# Patient Record
Sex: Female | Born: 1945 | Race: White | Hispanic: No | Marital: Married | Smoking: Never smoker
Health system: Southern US, Community
[De-identification: ages and names within clinical notes are randomized; demographics above are authoritative.]

## PROBLEM LIST (undated history)

## (undated) DIAGNOSIS — E860 Dehydration: Secondary | ICD-10-CM

## (undated) DIAGNOSIS — I4721 Torsades de pointes: Secondary | ICD-10-CM

## (undated) DIAGNOSIS — I472 Ventricular tachycardia: Secondary | ICD-10-CM

## (undated) DIAGNOSIS — I4729 Other ventricular tachycardia: Secondary | ICD-10-CM

## (undated) DIAGNOSIS — E876 Hypokalemia: Secondary | ICD-10-CM

## (undated) DIAGNOSIS — F0390 Unspecified dementia without behavioral disturbance: Secondary | ICD-10-CM

## (undated) DIAGNOSIS — E119 Type 2 diabetes mellitus without complications: Secondary | ICD-10-CM

## (undated) DIAGNOSIS — E785 Hyperlipidemia, unspecified: Secondary | ICD-10-CM

## (undated) HISTORY — DX: Dehydration: E86.0

---

## 2019-09-06 DIAGNOSIS — C44311 Basal cell carcinoma of skin of nose: Secondary | ICD-10-CM | POA: Diagnosis not present

## 2019-09-06 DIAGNOSIS — L578 Other skin changes due to chronic exposure to nonionizing radiation: Secondary | ICD-10-CM | POA: Diagnosis not present

## 2019-09-06 DIAGNOSIS — L821 Other seborrheic keratosis: Secondary | ICD-10-CM | POA: Diagnosis not present

## 2019-09-06 DIAGNOSIS — D485 Neoplasm of uncertain behavior of skin: Secondary | ICD-10-CM | POA: Diagnosis not present

## 2019-09-06 DIAGNOSIS — L570 Actinic keratosis: Secondary | ICD-10-CM | POA: Diagnosis not present

## 2019-09-06 DIAGNOSIS — D2372 Other benign neoplasm of skin of left lower limb, including hip: Secondary | ICD-10-CM | POA: Diagnosis not present

## 2019-09-06 DIAGNOSIS — D0439 Carcinoma in situ of skin of other parts of face: Secondary | ICD-10-CM | POA: Diagnosis not present

## 2019-09-25 ENCOUNTER — Ambulatory Visit: Payer: Medicare Other | Attending: Internal Medicine

## 2019-09-25 DIAGNOSIS — Z20822 Contact with and (suspected) exposure to covid-19: Secondary | ICD-10-CM | POA: Diagnosis not present

## 2019-09-26 LAB — NOVEL CORONAVIRUS, NAA: SARS-CoV-2, NAA: DETECTED — AB

## 2019-09-27 ENCOUNTER — Other Ambulatory Visit: Payer: Self-pay | Admitting: Nurse Practitioner

## 2019-09-27 DIAGNOSIS — U071 COVID-19: Secondary | ICD-10-CM

## 2019-09-27 NOTE — Progress Notes (Signed)
  I connected by phone with Stephanie Acevedo on 09/27/2019 at 1:08 PM to discuss the potential use of an new treatment for mild to moderate COVID-19 viral infection in non-hospitalized patients.  This patient is a 74 y.o. female that meets the FDA criteria for Emergency Use Authorization of bamlanivimab or casirivimab\imdevimab.  Has a (+) direct SARS-CoV-2 viral test result  Has mild or moderate COVID-19   Is ? 74 years of age and weighs ? 40 kg  Is NOT hospitalized due to COVID-19  Is NOT requiring oxygen therapy or requiring an increase in baseline oxygen flow rate due to COVID-19  Is within 10 days of symptom onset  Has at least one of the high risk factor(s) for progression to severe COVID-19 and/or hospitalization as defined in EUA.  Specific high risk criteria : >/= 73 yo   I have spoken and communicated the following to the patient or parent/caregiver:  1. FDA has authorized the emergency use of bamlanivimab and casirivimab\imdevimab for the treatment of mild to moderate COVID-19 in adults and pediatric patients with positive results of direct SARS-CoV-2 viral testing who are 60 years of age and older weighing at least 40 kg, and who are at high risk for progressing to severe COVID-19 and/or hospitalization.  2. The significant known and potential risks and benefits of bamlanivimab and casirivimab\imdevimab, and the extent to which such potential risks and benefits are unknown.  3. Information on available alternative treatments and the risks and benefits of those alternatives, including clinical trials.  4. Patients treated with bamlanivimab and casirivimab\imdevimab should continue to self-isolate and use infection control measures (e.g., wear mask, isolate, social distance, avoid sharing personal items, clean and disinfect "high touch" surfaces, and frequent handwashing) according to CDC guidelines.   5. The patient or parent/caregiver has the option to accept or refuse  bamlanivimab or casirivimab\imdevimab .  After reviewing this information with the patient, The patient agreed to proceed with receiving the bamlanimivab infusion and will be provided a copy of the Fact sheet prior to receiving the infusion.Stephanie Acevedo 09/27/2019 1:08 PM  Note: Patient has dementia, but daughter states that symptoms started on 09/22/19

## 2019-09-30 ENCOUNTER — Ambulatory Visit (HOSPITAL_COMMUNITY)
Admission: RE | Admit: 2019-09-30 | Discharge: 2019-09-30 | Disposition: A | Discharge status: Home / Self Care | Payer: Medicare Other | Source: Ambulatory Visit | Attending: Pulmonary Disease | Admitting: Pulmonary Disease

## 2019-09-30 DIAGNOSIS — U071 COVID-19: Secondary | ICD-10-CM | POA: Diagnosis present

## 2019-09-30 DIAGNOSIS — Z23 Encounter for immunization: Secondary | ICD-10-CM | POA: Diagnosis not present

## 2019-09-30 MED ORDER — DIPHENHYDRAMINE HCL 50 MG/ML IJ SOLN: 50.0000 mg | Freq: Once | INTRAMUSCULAR | Status: DC | PRN

## 2019-09-30 MED ORDER — EPINEPHRINE 0.3 MG/0.3ML IJ SOAJ: 0.3000 mg | Freq: Once | INTRAMUSCULAR | Status: DC | PRN

## 2019-09-30 MED ORDER — ALBUTEROL SULFATE HFA 108 (90 BASE) MCG/ACT IN AERS: 2.0000 | INHALATION_SPRAY | Freq: Once | RESPIRATORY_TRACT | Status: DC | PRN

## 2019-09-30 MED ORDER — SODIUM CHLORIDE 0.9 % IV SOLN
700.0000 mg | Freq: Once | INTRAVENOUS | Status: AC
  Administered 2019-09-30: 700 mg via INTRAVENOUS
  Filled 2019-09-30: qty 20

## 2019-09-30 MED ORDER — SODIUM CHLORIDE 0.9 % IV SOLN
INTRAVENOUS | Status: DC | PRN
  Administered 2019-09-30: 250 mL via INTRAVENOUS

## 2019-09-30 MED ORDER — FAMOTIDINE IN NACL 20-0.9 MG/50ML-% IV SOLN: 20.0000 mg | Freq: Once | INTRAVENOUS | Status: DC | PRN

## 2019-09-30 MED ORDER — METHYLPREDNISOLONE SODIUM SUCC 125 MG IJ SOLR: 125.0000 mg | Freq: Once | INTRAMUSCULAR | Status: DC | PRN

## 2019-09-30 NOTE — Progress Notes (Signed)
  Diagnosis: COVID-19  Physician:Dr Wright  Procedure: [facility] Med: bamlanivimab infusion - Provided patient with bamlanimivab fact sheet for patients, parents and caregivers prior to infusion.  Complications: No immediate complications noted.  Discharge: Discharged home   Stephanie Acevedo W 09/30/2019  

## 2019-09-30 NOTE — Discharge Instructions (Signed)

## 2019-11-15 DIAGNOSIS — C44319 Basal cell carcinoma of skin of other parts of face: Secondary | ICD-10-CM | POA: Diagnosis not present

## 2019-11-27 ENCOUNTER — Encounter: Payer: Self-pay | Admitting: Internal Medicine

## 2019-11-27 ENCOUNTER — Other Ambulatory Visit: Payer: Self-pay

## 2019-11-27 ENCOUNTER — Ambulatory Visit (INDEPENDENT_AMBULATORY_CARE_PROVIDER_SITE_OTHER): Payer: Medicare Other | Admitting: Internal Medicine

## 2019-11-27 VITALS — BP 128/78 | HR 78 | Temp 97.3°F | Ht 65.5 in | Wt 117.6 lb

## 2019-11-27 DIAGNOSIS — R4689 Other symptoms and signs involving appearance and behavior: Secondary | ICD-10-CM | POA: Diagnosis not present

## 2019-11-27 DIAGNOSIS — R413 Other amnesia: Secondary | ICD-10-CM | POA: Insufficient documentation

## 2019-11-27 DIAGNOSIS — M25662 Stiffness of left knee, not elsewhere classified: Secondary | ICD-10-CM

## 2019-11-27 DIAGNOSIS — R4189 Other symptoms and signs involving cognitive functions and awareness: Secondary | ICD-10-CM | POA: Insufficient documentation

## 2019-11-27 NOTE — Progress Notes (Signed)
Location:  [facility] clinic  Provider: Dr. Bufford Spikes  Goals of Care:  Advanced Directives 11/27/2019  Does Patient Have a Medical Advance Directive? No  Would patient like information on creating a medical advance directive? Yes (ED - Information included in AVS)     Chief Complaint  Patient presents with  . Establish Care    new patient to estabalish care     HPI: Patient is a 74 y.o. female seen today to establish care as a new patient.   Husband present for visit  She had only seen urgent care a handful of times in her life. Did not have a PCP in the past. Daughter found [facility] on the internet.   Memory- In the past 6 months, her short-term memory has been an issue. Husband started her on prevagen supplement for memory a few months ago. Husband claims her memory had declined further after having covid-19 in January 2021. At that time she had delusions and could not identify her husband. Since recovering, she can identify her husband, place and situation, she still has difficulty with time. In addition, she has had hallucinations believing someone has stolen her pepsi. She can also become agitated easy. MRI done of neck, brain and spine in 2011. CD copy brought by husband.   Right ankle pain- Began in 2009. Had seen a specialist int he past and had MRI done in [place]. Pain had went away. In the past couple months pain has returned. No recent injuries or falls. Does not use a assistive device to ambulate. Husband states her leg appears stiff and she does not bend her knee when ambulating. Does not take any medicine for the right ankle pain.  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   Had a mild case of covid in January. Interested in getting covid vaccine in April 2021.   Eats three meals a day. Drinks pepsi and water. Husband prepares meals.   Walks every day for about 30 minutes, half a mile.   Last eye exam was about 3 years ago.   Can not  remember last dental visit. Denies dental pain.   Does not drive. Husband does all the driving  Lives in a one story house. No rugs or cords within hallways.   Sleeps about 8-9 hours a night. Reads before going to bed.       History reviewed. No pertinent past medical history.  History reviewed. No pertinent surgical history.  No Known Allergies  Outpatient Encounter Medications as of 11/27/2019  Medication Sig  . Apoaequorin (PREVAGEN EXTRA STRENGTH PO) Take by mouth daily.  . Multiple Vitamin (MULTIVITAMIN) tablet Take 1 tablet by mouth 2 (two) times daily.   No facility-administered encounter medications on file as of 11/27/2019.    Review of Systems:  Review of Systems  HENT: Negative for dental problem, hearing loss and trouble swallowing.   Eyes: Negative for photophobia and visual disturbance.       Eyeglasses  Cardiovascular: Negative for chest pain and leg swelling.  Gastrointestinal: Negative for abdominal pain, constipation, diarrhea and nausea.  Genitourinary: Negative for dysuria, frequency, hematuria and vaginal bleeding.  Musculoskeletal: Negative.        Right ankle pain  Skin:       Facial skin pain  Neurological: Negative for dizziness, tremors, weakness and headaches.  Psychiatric/Behavioral: Positive for agitation, dysphoric mood and hallucinations. Negative for sleep disturbance. The patient is nervous/anxious.  Health Maintenance  Topic Date Due  . Hepatitis C Screening  Never done  . TETANUS/TDAP  Never done  . MAMMOGRAM  Never done  . COLONOSCOPY  Never done  . DEXA SCAN  Never done  . PNA vac Low Risk Adult (1 of 2 - PCV13) Never done  . INFLUENZA VACCINE  Never done    Physical Exam: Vitals:   11/27/19 1346  BP: 128/78  Pulse: 78  Temp: (!) 97.3 F (36.3 C)  TempSrc: Temporal  SpO2: 98%  Weight: 117 lb 9.6 oz (53.3 kg)  Height: 5' 5.5" (1.664 m)   Body mass index is 19.27 kg/m. Physical Exam Vitals reviewed.   Constitutional:      Appearance: Normal appearance. She is normal weight.  HENT:     Head: Normocephalic.     Right Ear: There is impacted cerumen.     Left Ear: There is no impacted cerumen.     Mouth/Throat:     Mouth: Mucous membranes are moist.     Pharynx: No posterior oropharyngeal erythema.  Eyes:     Extraocular Movements: Extraocular movements intact.     Pupils: Pupils are equal, round, and reactive to light.  Neck:     Thyroid: No thyroid mass, thyromegaly or thyroid tenderness.  Cardiovascular:     Rate and Rhythm: Normal rate and regular rhythm.     Pulses: Normal pulses.     Heart sounds: Normal heart sounds. No murmur.  Pulmonary:     Effort: Pulmonary effort is normal. No respiratory distress.     Breath sounds: Normal breath sounds. No wheezing.  Abdominal:     General: Abdomen is flat. Bowel sounds are normal.     Palpations: Abdomen is soft.  Musculoskeletal:     Right lower leg: No edema.     Left lower leg: No edema.  Lymphadenopathy:     Cervical: No cervical adenopathy.  Skin:    General: Skin is warm and dry.     Capillary Refill: Capillary refill takes less than 2 seconds.  Neurological:     Mental Status: She is alert.     Sensory: Sensation is intact.     Motor: Motor function is intact. No tremor.     Coordination: Coordination is intact.     Gait: Gait abnormal.     Deep Tendon Reflexes: Reflexes are normal and symmetric.  Psychiatric:        Attention and Perception: Attention normal.        Mood and Affect: Affect is flat.        Speech: Speech normal.        Behavior: Behavior is slowed.        Thought Content: Thought content is paranoid.        Cognition and Memory: Memory is impaired.     Labs reviewed: Basic Metabolic Panel: No results for input(s): NA, K, CL, CO2, GLUCOSE, BUN, CREATININE, CALCIUM, MG, PHOS, TSH in the last 8760 hours. Liver Function Tests: No results for input(s): AST, ALT, ALKPHOS, BILITOT, PROT, ALBUMIN in  the last 8760 hours. No results for input(s): LIPASE, AMYLASE in the last 8760 hours. No results for input(s): AMMONIA in the last 8760 hours. CBC: No results for input(s): WBC, NEUTROABS, HGB, HCT, MCV, PLT in the last 8760 hours. Lipid Panel: No results for input(s): CHOL, HDL, LDLCALC, TRIG, CHOLHDL, LDLDIRECT in the last 8760 hours. No results found for: HGBA1C  Procedures since last visit: No results found.  Assessment/Plan 1. Cognitive and behavioral changes - MMSE 11/30, did not fill out clock  - she has a family history of dementia on her mothers side - appears to have delusions and/or hallucinations within the past months, most associated with someone taking her pepsi  2. Memory loss - same as above - will r/o electrolyte deficiency as underlying cause - cbc with differential/platelets- today - complete metabolic panel with GFR- today - hepatic function panel- today - syphillus/ RPR- today - TSH- today - Vitamin B12- today - referral CT head wo contrast  3. Joint stiffness of left lower leg - may be associated with dementia or arthritis - her gait is stiff but not unbalanced - may use tylenol 325-650 mg as needed for pain - may use heat to ankle for pain or stiffness - continue light walking daily    Labs/tests ordered:  Cbc with differential/platelets, complete metabolic panel with GFR, TSH, hepatic function panel, syphilis panel, vitamin b12- today, CT head wo contrast Next appt:  2 weeks

## 2019-11-28 LAB — CBC WITH DIFFERENTIAL/PLATELET
Absolute Monocytes: 639 cells/uL (ref 200–950)
Basophils Absolute: 113 cells/uL (ref 0–200)
Basophils Relative: 1.2 %
Eosinophils Absolute: 122 cells/uL (ref 15–500)
Eosinophils Relative: 1.3 %
HCT: 38.3 % (ref 35.0–45.0)
Hemoglobin: 13.1 g/dL (ref 11.7–15.5)
Lymphs Abs: 2124 cells/uL (ref 850–3900)
MCH: 29.8 pg (ref 27.0–33.0)
MCHC: 34.2 g/dL (ref 32.0–36.0)
MCV: 87 fL (ref 80.0–100.0)
MPV: 10.4 fL (ref 7.5–12.5)
Monocytes Relative: 6.8 %
Neutro Abs: 6401 cells/uL (ref 1500–7800)
Neutrophils Relative %: 68.1 %
Platelets: 332 10*3/uL (ref 140–400)
RBC: 4.4 10*6/uL (ref 3.80–5.10)
RDW: 13.1 % (ref 11.0–15.0)
Total Lymphocyte: 22.6 %
WBC: 9.4 10*3/uL (ref 3.8–10.8)

## 2019-11-28 LAB — BASIC METABOLIC PANEL
BUN: 17 mg/dL (ref 7–25)
CO2: 26 mmol/L (ref 20–32)
Calcium: 9.7 mg/dL (ref 8.6–10.4)
Chloride: 104 mmol/L (ref 98–110)
Creat: 0.79 mg/dL (ref 0.60–0.93)
Glucose, Bld: 140 mg/dL — ABNORMAL HIGH (ref 65–99)
Potassium: 3.4 mmol/L — ABNORMAL LOW (ref 3.5–5.3)
Sodium: 142 mmol/L (ref 135–146)

## 2019-11-28 LAB — HEPATIC FUNCTION PANEL
AG Ratio: 1.4 (calc) (ref 1.0–2.5)
ALT: 11 U/L (ref 6–29)
AST: 17 U/L (ref 10–35)
Albumin: 4.3 g/dL (ref 3.6–5.1)
Alkaline phosphatase (APISO): 74 U/L (ref 37–153)
Bilirubin, Direct: 0.1 mg/dL (ref 0.0–0.2)
Globulin: 3 g/dL (calc) (ref 1.9–3.7)
Indirect Bilirubin: 0.2 mg/dL (calc) (ref 0.2–1.2)
Total Bilirubin: 0.3 mg/dL (ref 0.2–1.2)
Total Protein: 7.3 g/dL (ref 6.1–8.1)

## 2019-11-28 LAB — RPR: RPR Ser Ql: NONREACTIVE

## 2019-11-28 LAB — TSH: TSH: 1.17 mIU/L (ref 0.40–4.50)

## 2019-11-28 LAB — VITAMIN B12: Vitamin B-12: 619 pg/mL (ref 200–1100)

## 2019-11-28 NOTE — Progress Notes (Signed)
Please notify her husband.  Overall labs were good.   Blood counts are normal. Potassium was slightly low.  This can be improved with bananas, oranges, sweet potatoes, tomatoes in the diet (as examples). Sugar was higher than normal at 140.  I could not check the sugar average without having that information.  We can do the average at a future visit to make sure she's not developing diabetes (these were not fasting labs though). Liver panel was normal. Thyroid was normal. B12 vitamin was normal. RPR (syphilis test done because [place] and it can cause dementia) was negative. Await CT of brain to be done.

## 2019-12-11 ENCOUNTER — Ambulatory Visit (INDEPENDENT_AMBULATORY_CARE_PROVIDER_SITE_OTHER): Payer: Medicare Other | Admitting: Internal Medicine

## 2019-12-11 ENCOUNTER — Other Ambulatory Visit: Payer: Self-pay

## 2019-12-11 ENCOUNTER — Encounter: Payer: Self-pay | Admitting: Internal Medicine

## 2019-12-11 VITALS — BP 120/77 | HR 69 | Temp 97.5°F | Ht 65.0 in | Wt 114.5 lb

## 2019-12-11 DIAGNOSIS — E876 Hypokalemia: Secondary | ICD-10-CM | POA: Diagnosis not present

## 2019-12-11 DIAGNOSIS — R739 Hyperglycemia, unspecified: Secondary | ICD-10-CM

## 2019-12-11 DIAGNOSIS — M25661 Stiffness of right knee, not elsewhere classified: Secondary | ICD-10-CM | POA: Diagnosis not present

## 2019-12-11 DIAGNOSIS — F0390 Unspecified dementia without behavioral disturbance: Secondary | ICD-10-CM

## 2019-12-11 NOTE — Progress Notes (Signed)
Location:  [facility] clinic Provider:  Feliciana Narayan L. Renato Acevedo, D.O., C.M.D.  Goals of Care:  Advanced Directives 12/11/2019  Does Patient Have a Medical Advance Directive? No  Would patient like information on creating a medical advance directive? No - Patient declined   Chief Complaint  Patient presents with  . Medical Management of Chronic Issues    2 week follow up with lab results .    HPI: Patient is a 74 y.o. female seen today for medical management of chronic diseases.  She is here with her husband in f/u after her labs but has not yet had her CT brain I ordered (it is scheduled).    She's been eating two oranges a day for her potassium and some tomatoes.    Her husband reports she had an episode of bad confusion where she didn't know who he was and wanted to go out looking for him.  It was like she was reliving something from back when she had covid.  That had happened with her once when she'd had covid.  She was wanting to go to the fire station to see if they could find him.  Now she has no recollection.  It lasted a good hour or more.  Then she calmed down.  Not nervous or worried otherwise.  They had together attended some [employer] educational programs when her mother had dementia.   She has had periods of shortness of breath.  Usually when out walking.  No coughing or bringing up sputum.  No chest pain reported with it.  Never smoked.  POX at home was 92% which was low for her but here it is 98%.  Only finding on labs was hyperglycemia so hba1c will be done today.  History reviewed. No pertinent past medical history.  History reviewed. No pertinent surgical history.  No Known Allergies  Outpatient Encounter Medications as of 12/11/2019  Medication Sig  . Apoaequorin (PREVAGEN EXTRA STRENGTH PO) Take by mouth daily.  . Multiple Vitamin (MULTIVITAMIN) tablet Take 1 tablet by mouth 2 (two) times daily.   No facility-administered encounter medications on file as of  12/11/2019.    Review of Systems:  Review of Systems  Constitutional: Positive for weight loss. Negative for chills, fever and malaise/fatigue.  HENT: Negative for congestion and hearing loss.   Eyes: Negative for blurred vision.       Glasses  Respiratory: Negative for cough and shortness of breath.   Cardiovascular: Negative for chest pain, palpitations and leg swelling.  Gastrointestinal: Negative for abdominal pain, blood in stool, constipation, diarrhea and melena.  Genitourinary: Negative for dysuria.  Musculoskeletal: Positive for joint pain. Negative for falls.       Right knee  Skin: Negative for itching and rash.    Health Maintenance  Topic Date Due  . Hepatitis C Screening  Never done  . TETANUS/TDAP  Never done  . MAMMOGRAM  Never done  . COLONOSCOPY  Never done  . DEXA SCAN  Never done  . PNA vac Low Risk Adult (1 of 2 - PCV13) Never done  . INFLUENZA VACCINE  Never done    Physical Exam: Vitals:   12/11/19 1045  BP: 120/77  Pulse: 69  Temp: (!) 97.5 F (36.4 C)  TempSrc: Temporal  SpO2: 98%  Weight: 114 lb 8 oz (51.9 kg)  Height: 5\' 5"  (1.651 m)   Body mass index is 19.05 kg/m. Physical Exam Constitutional:      General: She is not in  acute distress.    Appearance: Normal appearance. She is not toxic-appearing.     Comments: A bit disheveled appearing  HENT:     Head: Normocephalic and atraumatic.  Cardiovascular:     Rate and Rhythm: Normal rate and regular rhythm.     Pulses: Normal pulses.     Heart sounds: Normal heart sounds.  Pulmonary:     Effort: Pulmonary effort is normal.     Breath sounds: Normal breath sounds. No wheezing, rhonchi or rales.  Abdominal:     General: Bowel sounds are normal. There is no distension.     Palpations: Abdomen is soft. There is no mass.     Tenderness: There is no abdominal tenderness. There is no guarding or rebound.  Musculoskeletal:     Right lower leg: No edema.     Left lower leg: No edema.    Skin:    General: Skin is warm and dry.  Neurological:     General: No focal deficit present.     Mental Status: She is alert.     Cranial Nerves: No cranial nerve deficit.     Gait: Gait abnormal.     Comments: Right knee stiffness and some tenderness  Psychiatric:        Mood and Affect: Mood normal.     Comments: Quiet, only answers questions, does not initiate conversation     Labs reviewed: Basic Metabolic Panel: Recent Labs    11/27/19 1540  NA 142  K 3.4*  CL 104  CO2 26  GLUCOSE 140*  BUN 17  CREATININE 0.79  CALCIUM 9.7  TSH 1.17   Liver Function Tests: Recent Labs    11/27/19 1540  AST 17  ALT 11  BILITOT 0.3  PROT 7.3   No results for input(s): LIPASE, AMYLASE in the last 8760 hours. No results for input(s): AMMONIA in the last 8760 hours. CBC: Recent Labs    11/27/19 1540  WBC 9.4  NEUTROABS 6,401  HGB 13.1  HCT 38.3  MCV 87.0  PLT 332   Assessment/Plan 1. Dementia without behavioral disturbance, unspecified dementia type ([place]) - labs thus far only remarkable for elevated glucose--check hba1c today and treat accordingly -discussed aricept with her husband but opted not to start due to minimal benefit but primarily due to concern that she will lose more weight and she's already underweight -[employer] info provided to her husband  2. Joint stiffness of right lower leg - to get knee xray when she goes for her CT head if possible -previous MRI she had of her brain was done to r/o a stroke when the stiffness of the right leg began and was reportedly negative (on CD so her husband is going to bring a cd drive so I can see it) - DG Knee Complete 4 Views Right; Future  3. Hyperglycemia - on initial lab but was not fasting then so will check hba1c - Hemoglobin A1c  4. Hypokalemia -will f/u today after she increased her potassium-containing foods - Basic metabolic panel  Labs/tests ordered:   Lab Orders     Hemoglobin A1c     Basic  metabolic panel  Next appt:  12/25/2019 to review CT scan, start namenda possibly?  Stephanie Acevedo L. Jermiyah Ricotta, D.O. [facility] [address], [place] [address] Cell Phone (Mon-Fri 8am-5pm):  [phone] On Call:  [phone] & follow prompts after 5pm & weekends Office Phone:  [phone] Office Fax:  [phone]

## 2019-12-12 LAB — BASIC METABOLIC PANEL
BUN: 13 mg/dL (ref 7–25)
CO2: 30 mmol/L (ref 20–32)
Calcium: 9.6 mg/dL (ref 8.6–10.4)
Chloride: 106 mmol/L (ref 98–110)
Creat: 0.84 mg/dL (ref 0.60–0.93)
Glucose, Bld: 163 mg/dL — ABNORMAL HIGH (ref 65–139)
Potassium: 3.7 mmol/L (ref 3.5–5.3)
Sodium: 144 mmol/L (ref 135–146)

## 2019-12-12 LAB — HEMOGLOBIN A1C
Hgb A1c MFr Bld: 7.5 % of total Hgb — ABNORMAL HIGH (ref <5.7)
Mean Plasma Glucose: 169 (calc)
eAG (mmol/L): 9.3 (calc)

## 2019-12-12 NOTE — Progress Notes (Signed)
Potassium has improved to normal with increase in potassium intake in diet.   Unfortunately, she has diabetes.  Her sugar average is up to 7.5 which means if we pricked her finger continuously to check her sugar over three months and took an average of all of those numbers, it would be 169 which is well above the normal range of 65-139.  I recommend that she and her husband meet with a nutritionist to help modify her diet.  I'm hesitant to start her on diabetes medications b/c she is already so thin and almost all of them cause weight loss.  If they are agreeable, I will place the referral for nutrition.

## 2019-12-14 ENCOUNTER — Other Ambulatory Visit: Payer: Self-pay

## 2019-12-14 ENCOUNTER — Ambulatory Visit
Admission: RE | Admit: 2019-12-14 | Discharge: 2019-12-14 | Disposition: A | Discharge status: Home / Self Care | Payer: Medicare Other | Source: Ambulatory Visit | Attending: Internal Medicine | Admitting: Internal Medicine

## 2019-12-14 DIAGNOSIS — R410 Disorientation, unspecified: Secondary | ICD-10-CM | POA: Diagnosis not present

## 2019-12-14 DIAGNOSIS — R413 Other amnesia: Secondary | ICD-10-CM

## 2019-12-14 DIAGNOSIS — R4689 Other symptoms and signs involving appearance and behavior: Secondary | ICD-10-CM

## 2019-12-14 DIAGNOSIS — E119 Type 2 diabetes mellitus without complications: Secondary | ICD-10-CM | POA: Insufficient documentation

## 2019-12-14 DIAGNOSIS — R4189 Other symptoms and signs involving cognitive functions and awareness: Secondary | ICD-10-CM

## 2019-12-14 NOTE — Progress Notes (Signed)
CT brain does not show any acute strokes.  She does have shrinkage of the brain overall and areas of poor circulation in the areas of the brain that help with executive functioning (performing self-care day to day).    It was also noted that her nasal septum is deviated and she has some congestion in the sinus above her nose.

## 2019-12-25 ENCOUNTER — Encounter: Payer: Self-pay | Admitting: Internal Medicine

## 2019-12-25 ENCOUNTER — Other Ambulatory Visit: Payer: Self-pay

## 2019-12-25 ENCOUNTER — Ambulatory Visit (INDEPENDENT_AMBULATORY_CARE_PROVIDER_SITE_OTHER): Payer: Medicare Other | Admitting: Internal Medicine

## 2019-12-25 VITALS — BP 138/84 | HR 64 | Temp 97.3°F | Resp 16 | Ht 65.0 in | Wt 112.8 lb

## 2019-12-25 DIAGNOSIS — F0390 Unspecified dementia without behavioral disturbance: Secondary | ICD-10-CM

## 2019-12-25 DIAGNOSIS — E119 Type 2 diabetes mellitus without complications: Secondary | ICD-10-CM

## 2019-12-25 DIAGNOSIS — R634 Abnormal weight loss: Secondary | ICD-10-CM

## 2019-12-25 MED ORDER — DONEPEZIL HCL 5 MG PO TABS: 5.0000 mg | ORAL_TABLET | Freq: Every day | ORAL | 0 refills | Status: DC

## 2019-12-25 NOTE — Progress Notes (Signed)
Location:  [facility] clinic Provider:  Salinda Snedeker L. Renato Gails, D.O., C.M.D.  Code Status: NEED TO DISCUSS THIS AT NEXT APPT  Goals of Care:  Advanced Directives 12/25/2019  Does Patient Have a Medical Advance Directive? No  Would patient like information on creating a medical advance directive? No - Patient declined     Chief Complaint  Patient presents with  . Follow-up    Follow Up for CT Scan    HPI: Patient is a 74 y.o. female seen today for medical management of chronic diseases.    Reviewed prior MRI brain from many years ago when her leg stiffness began which already did show a few ischemic changes and slight atrophy--these are now more prominent on CT.    BP 120/80. Excellent at home.  Discussed hba1c of 7.5 as new diagnosis of diabetes.  I have referred them to nutrition due to concern that if I put her on a med she will lose more weight.   Main diet is pepsis and chocolate.  She is eating oranges now, cantelope.   She does no like veggies.  Does like broccoli and salads.  Likes tomatoes, carrots.   She is drinking one ensure per day--only the 4 sugars.  Discussed glucerna.  Her husband is trying to get her to eat more regular food. She does walk quite a bit.  25 miles a week.  She lost 1.5 more lbs.  She is fairly active.    Discussed getting covid vaccines now that they are done with the 90 day period.  They will get them different days in case he does not tolerate it well so he can still take care of her.    History reviewed. No pertinent past medical history.  History reviewed. No pertinent surgical history.  No Known Allergies  Outpatient Encounter Medications as of 12/25/2019  Medication Sig  . Apoaequorin (PREVAGEN EXTRA STRENGTH PO) Take by mouth daily.  . Multiple Vitamin (MULTIVITAMIN) tablet Take 1 tablet by mouth 2 (two) times daily.   No facility-administered encounter medications on file as of 12/25/2019.    Review of Systems:  Review of Systems    Constitutional: Positive for weight loss. Negative for chills, fever and malaise/fatigue.  Eyes: Negative for blurred vision.       Glasses  Cardiovascular: Negative for chest pain, palpitations and leg swelling.  Genitourinary: Negative for dysuria.    Health Maintenance  Topic Date Due  . Hepatitis C Screening  Never done  . FOOT EXAM  Never done  . OPHTHALMOLOGY EXAM  Never done  . URINE MICROALBUMIN  Never done  . TETANUS/TDAP  Never done  . MAMMOGRAM  Never done  . COLONOSCOPY  Never done  . DEXA SCAN  Never done  . PNA vac Low Risk Adult (1 of 2 - PCV13) Never done  . INFLUENZA VACCINE  04/14/2020  . HEMOGLOBIN A1C  06/12/2020    Physical Exam: Vitals:   12/25/19 1010  BP: 138/84  Pulse: 64  Resp: 16  Temp: (!) 97.3 F (36.3 C)  SpO2: 97%  Weight: 112 lb 12.8 oz (51.2 kg)  Height: 5\' 5"  (1.651 m)   Body mass index is 18.77 kg/m. Physical Exam  Labs reviewed: Basic Metabolic Panel: Recent Labs    11/27/19 1540 12/11/19 1144  NA 142 144  K 3.4* 3.7  CL 104 106  CO2 26 30  GLUCOSE 140* 163*  BUN 17 13  CREATININE 0.79 0.84  CALCIUM 9.7 9.6  TSH  1.17  --    Liver Function Tests: Recent Labs    11/27/19 1540  AST 17  ALT 11  BILITOT 0.3  PROT 7.3   No results for input(s): LIPASE, AMYLASE in the last 8760 hours. No results for input(s): AMMONIA in the last 8760 hours. CBC: Recent Labs    11/27/19 1540  WBC 9.4  NEUTROABS 6,401  HGB 13.1  HCT 38.3  MCV 87.0  PLT 332   Lipid Panel: No results for input(s): CHOL, HDL, LDLCALC, TRIG, CHOLHDL, LDLDIRECT in the last 8760 hours. Lab Results  Component Value Date   HGBA1C 7.5 (H) 12/11/2019    Procedures since last visit: CT Head Wo Contrast  Result Date: 12/14/2019 CLINICAL DATA:  Memory loss with gait disturbance. Episodes of confusion EXAM: CT HEAD WITHOUT CONTRAST TECHNIQUE: Contiguous axial images were obtained from the base of the skull through the vertex without intravenous  contrast. COMPARISON:  None. FINDINGS: Brain: There is mild to moderate diffuse atrophy. There is no intracranial mass, hemorrhage, extra-axial fluid collection, or midline shift. There is mild small vessel disease in the centra semiovale bilaterally. Brain parenchyma elsewhere unremarkable. No acute infarct is demonstrable on this study. Vascular: No hyperdense vessels. No appreciable vascular calcification. Skull: The bony calvarium appears intact. Sinuses/Orbits: Mucosal thickening is noted involving several ethmoid air cells. There is rightward deviation of the nasal septum. Other visualized paranasal sinuses are clear. Orbits appear symmetric bilaterally. Other: Mastoid air cells are clear. IMPRESSION: Atrophy with mild periventricular small vessel disease. No acute infarct. No mass or hemorrhage. Mucosal thickening in several ethmoid air cells. Deviated nasal septum. Electronically Signed   By: Lowella Grip III M.D.   On: 12/14/2019 09:33    Assessment/Plan 1. Dementia without behavioral disturbance, unspecified dementia type ([place]) - I also wanted to try her on namenda straight away b/c of her weight loss risk; however, her insurance would not cover it without trying alternatives first so we will attempt aricept--side effects include potentially diarrhea, nightmares, weight loss, bradycardia - donepezil (ARICEPT) 5 MG tablet; Take 1 tablet (5 mg total) by mouth at bedtime.  Dispense: 30 tablet; Refill: 0  2. Weight loss -down a couple more lbs since last visit -needs more protein in diet and nutritious foods, lives off sweet things  3. Controlled type 2 diabetes mellitus without complication, without long-term current use of insulin ([facility]) -needs good eye and foot exams, urine microalbumin test (will try to get these set up next time) -await nutrition consult to assist her husband with meal planning to lower her glucose without adding meds that cause weight loss in themselves  Labs/tests  ordered:   Lab Orders  No laboratory test(s) ordered today   Next appt:  Visit date not found   Chares Slaymaker L. Tadao Emig, D.O. [facility] [address], [place] [address] Cell Phone (Mon-Fri 8am-5pm):  [phone] On Call:  [phone] & follow prompts after 5pm & weekends Office Phone:  [phone] Office Fax:  [phone]

## 2020-01-18 ENCOUNTER — Ambulatory Visit: Payer: Medicare Other | Attending: Internal Medicine

## 2020-01-18 DIAGNOSIS — Z23 Encounter for immunization: Secondary | ICD-10-CM

## 2020-01-18 NOTE — Progress Notes (Signed)
   Covid-19 Vaccination Clinic  Name:  Stephanie Acevedo    MRN: [id number] DOB: August 02, 1946  01/18/2020  Stephanie Acevedo was observed post Covid-19 immunization for 15 minutes without incident. She was provided with Vaccine Information Sheet and instruction to access the V-Safe system.   Stephanie Acevedo was instructed to call 911 with any severe reactions post vaccine: Marland Kitchen Difficulty breathing  . Swelling of face and throat  . A fast heartbeat  . A bad rash all over body  . Dizziness and weakness   Immunizations Administered    Name Date Dose VIS Date Route   Pfizer COVID-19 Vaccine 01/18/2020  8:31 AM 0.3 mL 11/08/2018 Intramuscular   Manufacturer: [employer], [employer]   Lot: [id number]   NDC: [address]-1

## 2020-01-20 ENCOUNTER — Other Ambulatory Visit: Payer: Self-pay | Admitting: Internal Medicine

## 2020-01-20 DIAGNOSIS — F0390 Unspecified dementia without behavioral disturbance: Secondary | ICD-10-CM

## 2020-01-22 ENCOUNTER — Encounter: Payer: Self-pay | Admitting: Internal Medicine

## 2020-01-22 ENCOUNTER — Other Ambulatory Visit: Payer: Self-pay

## 2020-01-22 ENCOUNTER — Ambulatory Visit (INDEPENDENT_AMBULATORY_CARE_PROVIDER_SITE_OTHER): Payer: Medicare Other | Admitting: Internal Medicine

## 2020-01-22 VITALS — BP 110/58 | HR 67 | Temp 97.1°F | Ht 65.0 in | Wt 110.6 lb

## 2020-01-22 DIAGNOSIS — F0390 Unspecified dementia without behavioral disturbance: Secondary | ICD-10-CM

## 2020-01-22 DIAGNOSIS — E119 Type 2 diabetes mellitus without complications: Secondary | ICD-10-CM

## 2020-01-22 DIAGNOSIS — Z7189 Other specified counseling: Secondary | ICD-10-CM

## 2020-01-22 DIAGNOSIS — Z1159 Encounter for screening for other viral diseases: Secondary | ICD-10-CM | POA: Diagnosis not present

## 2020-01-22 MED ORDER — DONEPEZIL HCL 10 MG PO TABS: 10.0000 mg | ORAL_TABLET | Freq: Every day | ORAL | 3 refills | Status: DC

## 2020-01-22 NOTE — Progress Notes (Signed)
Location:  [facility] clinic Provider:  Yzabelle Calles L. Mariea Clonts, D.O., C.M.D.  Code Status: DNR  Goals of Care:  Mr. Sovine has gotten living will and hcpoa but they need to get them notarized. Advanced Directives 01/22/2020  Does Patient Have a Medical Advance Directive? No  Would patient like information on creating a medical advance directive? Yes (ED - Information included in AVS)   Chief Complaint  Patient presents with  . Medical Management of Chronic Issues    Follow up for memory     HPI: Patient is a 74 y.o. female seen today for medical management of chronic diseases.    Mood swings have disappeared.  Doing pretty well.  Sleeping alright.  Eating some healthier--her husband has made some changes and then they may f/u with dietitian depending to see how she does.  She's tolerating the aricept well.  No loose stools or nausea.  Had her first covid pfizer vaccine on 5/6 and second will be 6/1.  No side effects.  She had a negative colon screening through [employer] at home.  Eye exam:  Several years since that was done.  [facility] in [place].    She's never taken flu or pneumonia vaccines.    History reviewed. No pertinent past medical history.  History reviewed. No pertinent surgical history.  No Known Allergies  Outpatient Encounter Medications as of 01/22/2020  Medication Sig  . Apoaequorin (PREVAGEN EXTRA STRENGTH PO) Take by mouth daily.  . Multiple Vitamin (MULTIVITAMIN) tablet Take 1 tablet by mouth 2 (two) times daily.  . [DISCONTINUED] donepezil (ARICEPT) 5 MG tablet Take 1 tablet (5 mg total) by mouth at bedtime.   No facility-administered encounter medications on file as of 01/22/2020.    Review of Systems:  Review of Systems  Constitutional: Negative for chills, fever, malaise/fatigue and weight loss.  Eyes: Negative for blurred vision.       Glasses  Respiratory: Negative for cough and shortness of breath.   Cardiovascular: Negative for chest pain,  palpitations and leg swelling.  Gastrointestinal: Negative for abdominal pain, blood in stool, constipation, diarrhea, melena and nausea.  Genitourinary: Negative for dysuria.  Musculoskeletal: Positive for joint pain. Negative for falls.  Neurological: Negative for dizziness and loss of consciousness.  Psychiatric/Behavioral: Positive for memory loss. Negative for depression. The patient is not nervous/anxious and does not have insomnia.        Moods improved    Health Maintenance  Topic Date Due  . Hepatitis C Screening  Never done  . FOOT EXAM  Never done  . OPHTHALMOLOGY EXAM  Never done  . URINE MICROALBUMIN  Never done  . TETANUS/TDAP  Never done  . MAMMOGRAM  Never done  . COLONOSCOPY  Never done  . DEXA SCAN  Never done  . PNA vac Low Risk Adult (1 of 2 - PCV13) Never done  . COVID-19 Vaccine (2 - Pfizer 2-dose series) 02/08/2020  . INFLUENZA VACCINE  04/14/2020  . HEMOGLOBIN A1C  06/12/2020    Physical Exam: Vitals:   01/22/20 0821  BP: (!) 110/58  Pulse: 67  Temp: (!) 97.1 F (36.2 C)  TempSrc: Temporal  SpO2: 98%  Weight: 110 lb 9.6 oz (50.2 kg)  Height: 5\' 5"  (1.651 m)   Body mass index is 18.4 kg/m. Physical Exam Constitutional:      General: She is not in acute distress.    Appearance: Normal appearance. She is not toxic-appearing.     Comments: Thin female  HENT:     Head: Normocephalic and atraumatic.  Cardiovascular:     Rate and Rhythm: Normal rate and regular rhythm.     Pulses: Normal pulses.     Heart sounds: Normal heart sounds.  Pulmonary:     Effort: Pulmonary effort is normal.     Breath sounds: Normal breath sounds. No wheezing, rhonchi or rales.  Abdominal:     General: Bowel sounds are normal.  Musculoskeletal:        General: Normal range of motion.     Right lower leg: No edema.     Left lower leg: No edema.  Skin:    General: Skin is warm and dry.     Capillary Refill: Capillary refill takes less than 2 seconds.    Neurological:     General: No focal deficit present.     Mental Status: She is alert.     Sensory: No sensory deficit.     Gait: Gait abnormal.     Comments: Left leg stiffness  Psychiatric:        Mood and Affect: Mood normal.        Behavior: Behavior normal.     Comments: Does not talk a lot, but did initiate conversation a couple times today     Labs reviewed: Basic Metabolic Panel: Recent Labs    11/27/19 1540 12/11/19 1144  NA 142 144  K 3.4* 3.7  CL 104 106  CO2 26 30  GLUCOSE 140* 163*  BUN 17 13  CREATININE 0.79 0.84  CALCIUM 9.7 9.6  TSH 1.17  --    Liver Function Tests: Recent Labs    11/27/19 1540  AST 17  ALT 11  BILITOT 0.3  PROT 7.3   No results for input(s): LIPASE, AMYLASE in the last 8760 hours. No results for input(s): AMMONIA in the last 8760 hours. CBC: Recent Labs    11/27/19 1540  WBC 9.4  NEUTROABS 6,401  HGB 13.1  HCT 38.3  MCV 87.0  PLT 332   Lipid Panel: No results for input(s): CHOL, HDL, LDLCALC, TRIG, CHOLHDL, LDLDIRECT in the last 8760 hours. Lab Results  Component Value Date   HGBA1C 7.5 (H) 12/11/2019    Assessment/Plan 1. Dementia without behavioral disturbance, unspecified dementia type (HCC) -  MMSE - Mini Mental State Exam 11/27/2019  Orientation to time 1  Orientation to Place 2.5  Registration 1  Attention/ Calculation 2.5  Recall 1  Language- name 2 objects 2  Language- repeat 0  Language- follow 3 step command 1  Language- read & follow direction 0  Write a sentence 0  Copy design 0  Total score 11  was having moodiness, but this has improved since taking aricept 5mg  daily per Mr. Frommelt -we'll increase now to 10mg  and see how she does - donepezil (ARICEPT) 10 MG tablet; Take 1 tablet (10 mg total) by mouth at bedtime.  Dispense: 90 tablet; Refill: 3 - Microalbumin / creatinine urine ratio  2. Controlled type 2 diabetes mellitus without complication, without long-term current use of insulin  (HCC) Lab Results  Component Value Date   HGBA1C 7.5 (H) 12/11/2019  was new diagnosis here at [facility] -check urine microalbumin today -recommended diabetic eye exam be done by ophthalmologist of choice -f/u labs before visit in July--her husband has made dietary changes per recommendations from nutrition and they will see them if labs are not improving with said changes: - Hemoglobin A1c; Future - Lipid panel; Future - Basic metabolic  panel; Future  3. ACP (advance care planning) -discussed code status today, as well as importance of living will and HCPOA -she and her husband have discussed DNR status and this is her preference--they understand that this means no CPR or intubation--she does not want to be put onto a ventilator -we also discussed mammograms and have opted not to pursue that at this time unless they decide otherwise  4. Encounter for hepatitis C screening test for low risk patient - will check with next routine labs in July - Hepatitis C antibody; Future  Will plan on prevnar at next visit after completes covid series.  Will need pneumovax in a year.  Also will need tdap at some point.    Also plan to order bone density and do foot exam.  Her husband was going to look for the paperwork from insurance where colon cancer screen was done.  Labs/tests ordered:   Lab Orders     Microalbumin / creatinine urine ratio     Hemoglobin A1c     Lipid panel     Basic metabolic panel     Hepatitis C antibody  Next appt:  About 2 mos med mgt, fasting labs before  Corliss Lamartina L. Levie Owensby, D.O. Geriatrics [facility] [address], [place] [address] Cell Phone (Mon-Fri 8am-5pm):  [phone] On Call:  [phone] & follow prompts after 5pm & weekends Office Phone:  [phone] Office Fax:  [phone]

## 2020-01-22 NOTE — Patient Instructions (Addendum)
I recommend an ophthalmologic examination for diabetic retinopathy in the near future.    Please bring [place] a copy of your living will and HCPOA when notarized.

## 2020-01-22 NOTE — Telephone Encounter (Signed)
rx sent to pharmacy by e-script  

## 2020-01-23 LAB — MICROALBUMIN / CREATININE URINE RATIO
Creatinine, Urine: 159 mg/dL (ref 20–275)
Microalb Creat Ratio: 8 mcg/mg creat (ref <30)
Microalb, Ur: 1.2 mg/dL

## 2020-01-23 NOTE — Progress Notes (Signed)
No significant protein in urine.

## 2020-02-13 ENCOUNTER — Ambulatory Visit: Payer: Medicare Other | Attending: Internal Medicine

## 2020-02-13 DIAGNOSIS — Z23 Encounter for immunization: Secondary | ICD-10-CM

## 2020-02-13 NOTE — Progress Notes (Signed)
   Covid-19 Vaccination Clinic  Name:  Stephanie Acevedo    MRN: [id number] DOB: 1946-05-09  02/13/2020  [name] was observed post Covid-19 immunization for 15 minutes without incident. She was provided with Vaccine Information Sheet and instruction to access the V-Safe system.   [name] was instructed to call 911 with any severe reactions post vaccine: Marland Kitchen Difficulty breathing  . Swelling of face and throat  . A fast heartbeat  . A bad rash all over body  . Dizziness and weakness   Immunizations Administered    Name Date Dose VIS Date Route   Pfizer COVID-19 Vaccine 02/13/2020  9:31 AM 0.3 mL 11/08/2018 Intramuscular   Manufacturer: [employer], [employer]   Lot: [id number]   NDC: [address]-3

## 2020-03-20 DIAGNOSIS — D1801 Hemangioma of skin and subcutaneous tissue: Secondary | ICD-10-CM | POA: Diagnosis not present

## 2020-03-20 DIAGNOSIS — Z08 Encounter for follow-up examination after completed treatment for malignant neoplasm: Secondary | ICD-10-CM | POA: Diagnosis not present

## 2020-03-20 DIAGNOSIS — L821 Other seborrheic keratosis: Secondary | ICD-10-CM | POA: Diagnosis not present

## 2020-03-20 DIAGNOSIS — Z85828 Personal history of other malignant neoplasm of skin: Secondary | ICD-10-CM | POA: Diagnosis not present

## 2020-03-25 ENCOUNTER — Other Ambulatory Visit: Payer: Medicare Other

## 2020-03-25 ENCOUNTER — Other Ambulatory Visit: Payer: Self-pay

## 2020-03-25 DIAGNOSIS — Z1159 Encounter for screening for other viral diseases: Secondary | ICD-10-CM | POA: Diagnosis not present

## 2020-03-25 DIAGNOSIS — E119 Type 2 diabetes mellitus without complications: Secondary | ICD-10-CM

## 2020-03-26 LAB — HEPATITIS C ANTIBODY
Hepatitis C Ab: NONREACTIVE
SIGNAL TO CUT-OFF: 0.02 (ref <1.00)

## 2020-03-26 LAB — BASIC METABOLIC PANEL
BUN: 15 mg/dL (ref 7–25)
CO2: 29 mmol/L (ref 20–32)
Calcium: 9.3 mg/dL (ref 8.6–10.4)
Chloride: 107 mmol/L (ref 98–110)
Creat: 0.81 mg/dL (ref 0.60–0.93)
Glucose, Bld: 122 mg/dL — ABNORMAL HIGH (ref 65–99)
Potassium: 4 mmol/L (ref 3.5–5.3)
Sodium: 140 mmol/L (ref 135–146)

## 2020-03-26 LAB — LIPID PANEL
Cholesterol: 233 mg/dL — ABNORMAL HIGH (ref <200)
HDL: 64 mg/dL (ref >=50)
LDL Cholesterol (Calc): 135 mg/dL (calc) — ABNORMAL HIGH
Non-HDL Cholesterol (Calc): 169 mg/dL (calc) — ABNORMAL HIGH (ref <130)
Total CHOL/HDL Ratio: 3.6 (calc) (ref <5.0)
Triglycerides: 202 mg/dL — ABNORMAL HIGH (ref <150)

## 2020-03-26 LAB — HEMOGLOBIN A1C
Hgb A1c MFr Bld: 6.5 % of total Hgb — ABNORMAL HIGH (ref <5.7)
Mean Plasma Glucose: 140 (calc)
eAG (mmol/L): 7.7 (calc)

## 2020-03-26 NOTE — Progress Notes (Signed)
Hepatitis c screen is negative. Electrolytes and kidneys are ok. Bad cholesterol remains quite high--we need to address at her appt. Sugar average has improved by a full point from 7.5 to 6.5 which is lower end of diabetic range now.

## 2020-03-28 ENCOUNTER — Ambulatory Visit: Payer: Medicare Other | Admitting: Internal Medicine

## 2020-04-04 ENCOUNTER — Other Ambulatory Visit: Payer: Self-pay

## 2020-04-04 ENCOUNTER — Encounter: Payer: Self-pay | Admitting: Internal Medicine

## 2020-04-04 ENCOUNTER — Ambulatory Visit (INDEPENDENT_AMBULATORY_CARE_PROVIDER_SITE_OTHER): Payer: Medicare Other | Admitting: Internal Medicine

## 2020-04-04 VITALS — BP 118/82 | HR 65 | Temp 97.8°F | Wt 106.7 lb

## 2020-04-04 DIAGNOSIS — E1142 Type 2 diabetes mellitus with diabetic polyneuropathy: Secondary | ICD-10-CM

## 2020-04-04 DIAGNOSIS — R636 Underweight: Secondary | ICD-10-CM | POA: Diagnosis not present

## 2020-04-04 DIAGNOSIS — E785 Hyperlipidemia, unspecified: Secondary | ICD-10-CM

## 2020-04-04 DIAGNOSIS — Z23 Encounter for immunization: Secondary | ICD-10-CM | POA: Diagnosis not present

## 2020-04-04 DIAGNOSIS — F0390 Unspecified dementia without behavioral disturbance: Secondary | ICD-10-CM

## 2020-04-04 DIAGNOSIS — E1169 Type 2 diabetes mellitus with other specified complication: Secondary | ICD-10-CM | POA: Diagnosis not present

## 2020-04-04 DIAGNOSIS — Z681 Body mass index (BMI) 19 or less, adult: Secondary | ICD-10-CM

## 2020-04-04 MED ORDER — ROSUVASTATIN CALCIUM 5 MG PO TABS: 5.0000 mg | ORAL_TABLET | Freq: Every day | ORAL | 3 refills | Status: DC

## 2020-04-04 NOTE — Progress Notes (Signed)
Location:  [facility] clinic Provider:  Sadako Cegielski L. Renato Gails, D.O., C.M.D.  Code Status: DNR Goals of Care:  Advanced Directives 04/04/2020  Does Patient Have a Medical Advance Directive? Yes  Type of Advance Directive Out of facility DNR (pink MOST or yellow form)  Does patient want to make changes to medical advance directive? No - Patient declined  Would patient like information on creating a medical advance directive? -   Chief Complaint  Patient presents with  . Medical Management of Chronic Issues    2 month follow up     HPI: Patient is a 74 y.o. female seen today for medical management of chronic diseases.    Weight is trending down on aricept.  She cut back on pepsis an awful lot with the diabetes diagnosis.  Her husband reports she's picky.  She is eating meats and milk products well for protein.    She's sleeping fewer hours.  Normally slept 8-12 hrs.  Now lucky to get 6.  Goes to bed like 7:30pm, gets up a couple hrs later, goes back to bed.  Does not having dreams and husband says she's never been a dreamer.  Sleeps through bad thunderstorms--is dead to the world when does go to sleep.  [place]    Has good and bad days with memory.  No hallucinations.  Taking meds ok.  History reviewed. No pertinent past medical history.  History reviewed. No pertinent surgical history.  No Known Allergies  Outpatient Encounter Medications as of 04/04/2020  Medication Sig  . Apoaequorin (PREVAGEN EXTRA STRENGTH PO) Take by mouth daily.  Marland Kitchen donepezil (ARICEPT) 10 MG tablet Take 1 tablet (10 mg total) by mouth at bedtime.  . Multiple Vitamin (MULTIVITAMIN) tablet Take 1 tablet by mouth 2 (two) times daily.   No facility-administered encounter medications on file as of 04/04/2020.    Review of Systems:  Review of Systems  Constitutional: Negative for chills and fever.  HENT: Negative for congestion and hearing loss.   Eyes:       Glasses  Respiratory: Negative for  cough and shortness of breath.   Cardiovascular: Negative for chest pain, palpitations and leg swelling.  Gastrointestinal: Negative for abdominal pain, constipation and diarrhea.  Genitourinary: Negative for dysuria.  Musculoskeletal: Negative for falls and joint pain.  Neurological: Negative for dizziness and loss of consciousness.  Psychiatric/Behavioral: Positive for memory loss. Negative for depression. The patient has insomnia.        Sleeping less and broken up sleep    Health Maintenance  Topic Date Due  . OPHTHALMOLOGY EXAM  04/04/2020 (Originally 06/04/1956)  . FOOT EXAM  03/15/2021 (Originally 06/04/1956)  . MAMMOGRAM  04/04/2021 (Originally 06/04/1997)  . DEXA SCAN  04/04/2021 (Originally 06/04/2012)  . COLONOSCOPY  04/04/2021 (Originally 06/04/1997)  . TETANUS/TDAP  04/04/2021 (Originally 06/04/1966)  . PNA vac Low Risk Adult (1 of 2 - PCV13) 04/04/2021 (Originally 06/04/2012)  . INFLUENZA VACCINE  04/14/2020  . HEMOGLOBIN A1C  09/25/2020  . URINE MICROALBUMIN  01/21/2021  . COVID-19 Vaccine  Completed  . Hepatitis C Screening  Completed    Physical Exam: Vitals:   04/04/20 1503  BP: 118/82  Pulse: 65  Temp: 97.8 F (36.6 C)  SpO2: 96%  Weight: 106 lb 11.2 oz (48.4 kg)   Body mass index is 17.76 kg/m. Physical Exam Vitals reviewed.  HENT:     Head: Normocephalic and atraumatic.  Eyes:     Comments: glasses  Cardiovascular:  Rate and Rhythm: Normal rate and regular rhythm.     Pulses: Normal pulses.     Heart sounds: Normal heart sounds.  Pulmonary:     Effort: Pulmonary effort is normal.     Breath sounds: Normal breath sounds.  Abdominal:     General: Bowel sounds are normal.     Palpations: Abdomen is soft.     Tenderness: There is no abdominal tenderness. There is no guarding or rebound.  Musculoskeletal:        General: Normal range of motion.  Neurological:     General: No focal deficit present.     Mental Status: She is alert and oriented to  person, place, and time.     Gait: Gait abnormal.     Comments: Short-term memory loss, husband helps with history  Psychiatric:        Mood and Affect: Mood normal.     Labs reviewed: Basic Metabolic Panel: Recent Labs    11/27/19 1540 12/11/19 1144 03/25/20 0846  NA 142 144 140  K 3.4* 3.7 4.0  CL 104 106 107  CO2 26 30 29   GLUCOSE 140* 163* 122*  BUN 17 13 15   CREATININE 0.79 0.84 0.81  CALCIUM 9.7 9.6 9.3  TSH 1.17  --   --    Liver Function Tests: Recent Labs    11/27/19 1540  AST 17  ALT 11  BILITOT 0.3  PROT 7.3   No results for input(s): LIPASE, AMYLASE in the last 8760 hours. No results for input(s): AMMONIA in the last 8760 hours. CBC: Recent Labs    11/27/19 1540  WBC 9.4  NEUTROABS 6,401  HGB 13.1  HCT 38.3  MCV 87.0  PLT 332   Lipid Panel: Recent Labs    03/25/20 0846  CHOL 233*  HDL 64  LDLCALC 135*  TRIG 202*  CHOLHDL 3.6   Lab Results  Component Value Date   HGBA1C 6.5 (H) 03/25/2020     Assessment/Plan 1. Hyperlipidemia associated with type 2 diabetes mellitus (HCC) - not at goal--educated them about goal LDL in DMII and dietary changes to reach goal -her husband has improved her diet considerably and hba1c improving - rosuvastatin (CRESTOR) 5 MG tablet; Take 1 tablet (5 mg total) by mouth daily.  Dispense: 90 tablet; Refill: 3 - Lipid panel; Future  2. Controlled type 2 diabetes mellitus with diabetic polyneuropathy, without long-term current use of insulin (HCC) - hba1c trended down:  Now 6.5, previously 7.5 in march when I first diagnosed her DMII -I was hoping her cognitive status might improve as this came down, but this has not occured - Hemoglobin A1c; Future -appears eye exam still outstanding to r/o retinopathy--need to update next time  3. Dementia without behavioral disturbance, unspecified dementia type (HCC) -CT showed atrophy with mild periventricular small vessel disease suggesting vascular etiology, may have  mix with AD  4. Underweight -trending down with improved diet actually (had been living off chocolate primarily when she first saw me early in the year) [facility] Weights   04/04/20 1503  Weight: 106 lb 11.2 oz (48.4 kg)    5. Body mass index (BMI) of 19 or less in adult -encouraged continued improved eating habits, may benefit from glucerna or similar diabetic-friendly shakes for nutrition  6. Need for vaccination with 13-polyvalent pneumococcal conjugate vaccine - Pneumococcal conjugate vaccine 13-valent given -next year, she'll need pneumovax, also needs tdap at some point and shingrix series, had covid series  Labs/tests ordered:  Lab Orders     Hemoglobin A1c     Lipid panel  Next appt:  Visit date not found   Shaquille Murdy L. Marcel Sorter, D.O. Geriatrics [facility] [address], [place] [address] Cell Phone (Mon-Fri 8am-5pm):  [phone] On Call:  [phone] & follow prompts after 5pm & weekends Office Phone:  [phone] Office Fax:  [phone]

## 2020-04-04 NOTE — Patient Instructions (Signed)
Try to stay up a little later at night so you are more exhausted and sleep better through the night.    Start crestor for your cholesterol.

## 2020-04-13 DIAGNOSIS — R636 Underweight: Secondary | ICD-10-CM | POA: Insufficient documentation

## 2020-04-13 DIAGNOSIS — E785 Hyperlipidemia, unspecified: Secondary | ICD-10-CM | POA: Insufficient documentation

## 2020-04-13 DIAGNOSIS — Z681 Body mass index (BMI) 19 or less, adult: Secondary | ICD-10-CM | POA: Insufficient documentation

## 2020-04-13 DIAGNOSIS — E1169 Type 2 diabetes mellitus with other specified complication: Secondary | ICD-10-CM | POA: Insufficient documentation

## 2020-04-19 DIAGNOSIS — C4441 Basal cell carcinoma of skin of scalp and neck: Secondary | ICD-10-CM | POA: Diagnosis not present

## 2020-04-19 DIAGNOSIS — C44319 Basal cell carcinoma of skin of other parts of face: Secondary | ICD-10-CM | POA: Diagnosis not present

## 2020-07-28 ENCOUNTER — Inpatient Hospital Stay (HOSPITAL_COMMUNITY)
Admission: EM | Admit: 2020-07-28 | Discharge: 2020-08-02 | DRG: 641 | Disposition: A | Discharge status: Home / Self Care | Payer: Medicare Other | Attending: Internal Medicine | Admitting: Internal Medicine

## 2020-07-28 ENCOUNTER — Emergency Department (HOSPITAL_COMMUNITY): Payer: Medicare Other

## 2020-07-28 ENCOUNTER — Encounter (HOSPITAL_COMMUNITY): Payer: Self-pay | Admitting: Emergency Medicine

## 2020-07-28 ENCOUNTER — Other Ambulatory Visit: Payer: Self-pay

## 2020-07-28 DIAGNOSIS — K5732 Diverticulitis of large intestine without perforation or abscess without bleeding: Secondary | ICD-10-CM | POA: Diagnosis not present

## 2020-07-28 DIAGNOSIS — K5730 Diverticulosis of large intestine without perforation or abscess without bleeding: Secondary | ICD-10-CM | POA: Diagnosis not present

## 2020-07-28 DIAGNOSIS — E782 Mixed hyperlipidemia: Secondary | ICD-10-CM | POA: Diagnosis not present

## 2020-07-28 DIAGNOSIS — R140 Abdominal distension (gaseous): Secondary | ICD-10-CM | POA: Diagnosis not present

## 2020-07-28 DIAGNOSIS — E876 Hypokalemia: Principal | ICD-10-CM | POA: Diagnosis present

## 2020-07-28 DIAGNOSIS — Z20822 Contact with and (suspected) exposure to covid-19: Secondary | ICD-10-CM | POA: Diagnosis present

## 2020-07-28 DIAGNOSIS — I361 Nonrheumatic tricuspid (valve) insufficiency: Secondary | ICD-10-CM | POA: Diagnosis not present

## 2020-07-28 DIAGNOSIS — R4182 Altered mental status, unspecified: Secondary | ICD-10-CM | POA: Diagnosis not present

## 2020-07-28 DIAGNOSIS — Z79899 Other long term (current) drug therapy: Secondary | ICD-10-CM | POA: Diagnosis not present

## 2020-07-28 DIAGNOSIS — I4729 Other ventricular tachycardia: Secondary | ICD-10-CM

## 2020-07-28 DIAGNOSIS — I4721 Torsades de pointes: Secondary | ICD-10-CM

## 2020-07-28 DIAGNOSIS — I959 Hypotension, unspecified: Secondary | ICD-10-CM | POA: Diagnosis present

## 2020-07-28 DIAGNOSIS — E7800 Pure hypercholesterolemia, unspecified: Secondary | ICD-10-CM | POA: Diagnosis not present

## 2020-07-28 DIAGNOSIS — R410 Disorientation, unspecified: Secondary | ICD-10-CM | POA: Diagnosis not present

## 2020-07-28 DIAGNOSIS — K5792 Diverticulitis of intestine, part unspecified, without perforation or abscess without bleeding: Secondary | ICD-10-CM

## 2020-07-28 DIAGNOSIS — E872 Acidosis: Secondary | ICD-10-CM | POA: Diagnosis present

## 2020-07-28 DIAGNOSIS — E119 Type 2 diabetes mellitus without complications: Secondary | ICD-10-CM | POA: Diagnosis present

## 2020-07-28 DIAGNOSIS — R112 Nausea with vomiting, unspecified: Secondary | ICD-10-CM | POA: Diagnosis not present

## 2020-07-28 DIAGNOSIS — F0390 Unspecified dementia without behavioral disturbance: Secondary | ICD-10-CM | POA: Diagnosis not present

## 2020-07-28 DIAGNOSIS — I6782 Cerebral ischemia: Secondary | ICD-10-CM | POA: Diagnosis not present

## 2020-07-28 DIAGNOSIS — I700 Atherosclerosis of aorta: Secondary | ICD-10-CM | POA: Diagnosis not present

## 2020-07-28 DIAGNOSIS — E860 Dehydration: Secondary | ICD-10-CM | POA: Diagnosis not present

## 2020-07-28 DIAGNOSIS — I472 Ventricular tachycardia: Secondary | ICD-10-CM | POA: Diagnosis not present

## 2020-07-28 DIAGNOSIS — I340 Nonrheumatic mitral (valve) insufficiency: Secondary | ICD-10-CM | POA: Diagnosis not present

## 2020-07-28 DIAGNOSIS — R197 Diarrhea, unspecified: Secondary | ICD-10-CM | POA: Diagnosis not present

## 2020-07-28 HISTORY — DX: Hypokalemia: E87.6

## 2020-07-28 HISTORY — DX: Hyperlipidemia, unspecified: E78.5

## 2020-07-28 HISTORY — DX: Torsades de pointes: I47.21

## 2020-07-28 HISTORY — DX: Type 2 diabetes mellitus without complications: E11.9

## 2020-07-28 HISTORY — DX: Unspecified dementia, unspecified severity, without behavioral disturbance, psychotic disturbance, mood disturbance, and anxiety: F03.90

## 2020-07-28 HISTORY — DX: Other ventricular tachycardia: I47.29

## 2020-07-28 HISTORY — DX: Ventricular tachycardia: I47.2

## 2020-07-28 LAB — CBC
HCT: 34.5 % — ABNORMAL LOW (ref 36.0–46.0)
Hemoglobin: 11.7 g/dL — ABNORMAL LOW (ref 12.0–15.0)
MCH: 30 pg (ref 26.0–34.0)
MCHC: 33.9 g/dL (ref 30.0–36.0)
MCV: 88.5 fL (ref 80.0–100.0)
Platelets: 262 10*3/uL (ref 150–400)
RBC: 3.9 MIL/uL (ref 3.87–5.11)
RDW: 13.2 % (ref 11.5–15.5)
WBC: 11 10*3/uL — ABNORMAL HIGH (ref 4.0–10.5)
nRBC: 0 % (ref 0.0–0.2)

## 2020-07-28 LAB — BASIC METABOLIC PANEL
Anion gap: 13 (ref 5–15)
BUN: 7 mg/dL — ABNORMAL LOW (ref 8–23)
CO2: 24 mmol/L (ref 22–32)
Calcium: 9.1 mg/dL (ref 8.9–10.3)
Chloride: 108 mmol/L (ref 98–111)
Creatinine, Ser: 0.89 mg/dL (ref 0.44–1.00)
GFR, Estimated: >60 mL/min (ref >60)
Glucose, Bld: 153 mg/dL — ABNORMAL HIGH (ref 70–99)
Potassium: 2 mmol/L — CL (ref 3.5–5.1)
Sodium: 145 mmol/L (ref 135–145)

## 2020-07-28 LAB — URINALYSIS, ROUTINE W REFLEX MICROSCOPIC
Bilirubin Urine: NEGATIVE
Glucose, UA: NEGATIVE mg/dL
Hgb urine dipstick: NEGATIVE
Ketones, ur: 20 mg/dL — AB
Leukocytes,Ua: NEGATIVE
Nitrite: NEGATIVE
Protein, ur: 30 mg/dL — AB
Specific Gravity, Urine: 1.011 (ref 1.005–1.030)
pH: 7 (ref 5.0–8.0)

## 2020-07-28 LAB — COMPREHENSIVE METABOLIC PANEL
ALT: 23 U/L (ref 0–44)
AST: 25 U/L (ref 15–41)
Albumin: 3.6 g/dL (ref 3.5–5.0)
Alkaline Phosphatase: 51 U/L (ref 38–126)
Anion gap: 14 (ref 5–15)
BUN: 8 mg/dL (ref 8–23)
CO2: 28 mmol/L (ref 22–32)
Calcium: 9.1 mg/dL (ref 8.9–10.3)
Chloride: 98 mmol/L (ref 98–111)
Creatinine, Ser: 0.92 mg/dL (ref 0.44–1.00)
GFR, Estimated: >60 mL/min (ref >60)
Glucose, Bld: 198 mg/dL — ABNORMAL HIGH (ref 70–99)
Potassium: <2 mmol/L — CL (ref 3.5–5.1)
Sodium: 140 mmol/L (ref 135–145)
Total Bilirubin: 0.6 mg/dL (ref 0.3–1.2)
Total Protein: 6.9 g/dL (ref 6.5–8.1)

## 2020-07-28 LAB — TROPONIN I (HIGH SENSITIVITY)
Troponin I (High Sensitivity): 14 ng/L (ref <18)
Troponin I (High Sensitivity): 17 ng/L (ref <18)

## 2020-07-28 LAB — RESPIRATORY PANEL BY RT PCR (FLU A&B, COVID)
Influenza A by PCR: NEGATIVE
Influenza B by PCR: NEGATIVE
SARS Coronavirus 2 by RT PCR: NEGATIVE

## 2020-07-28 LAB — LACTIC ACID, PLASMA
Lactic Acid, Venous: 2.6 mmol/L (ref 0.5–1.9)
Lactic Acid, Venous: 3.3 mmol/L (ref 0.5–1.9)

## 2020-07-28 LAB — CBG MONITORING, ED: Glucose-Capillary: 192 mg/dL — ABNORMAL HIGH (ref 70–99)

## 2020-07-28 LAB — MAGNESIUM: Magnesium: 2.2 mg/dL (ref 1.7–2.4)

## 2020-07-28 LAB — MRSA PCR SCREENING: MRSA by PCR: NEGATIVE

## 2020-07-28 MED ORDER — AMIODARONE HCL IN DEXTROSE 360-4.14 MG/200ML-% IV SOLN
30.0000 mg/h | INTRAVENOUS | Status: DC
  Administered 2020-07-28 (×2): 30 mg/h via INTRAVENOUS
  Filled 2020-07-28 (×2): qty 200

## 2020-07-28 MED ORDER — AMIODARONE HCL IN DEXTROSE 360-4.14 MG/200ML-% IV SOLN
60.0000 mg/h | INTRAVENOUS | Status: AC
  Administered 2020-07-28 (×2): 60 mg/h via INTRAVENOUS
  Filled 2020-07-28: qty 200

## 2020-07-28 MED ORDER — ROSUVASTATIN CALCIUM 5 MG PO TABS
5.0000 mg | ORAL_TABLET | Freq: Every day | ORAL | Status: DC
  Administered 2020-07-28 – 2020-08-01 (×5): 5 mg via ORAL
  Filled 2020-07-28 (×5): qty 1

## 2020-07-28 MED ORDER — MAGNESIUM SULFATE 2 GM/50ML IV SOLN
2.0000 g | Freq: Once | INTRAVENOUS | Status: AC
  Administered 2020-07-28: 2 g via INTRAVENOUS
  Filled 2020-07-28: qty 50

## 2020-07-28 MED ORDER — AMIODARONE LOAD VIA INFUSION
150.0000 mg | Freq: Once | INTRAVENOUS | Status: AC
  Administered 2020-07-28: 150 mg via INTRAVENOUS
  Filled 2020-07-28: qty 83.34

## 2020-07-28 MED ORDER — ACETAMINOPHEN 325 MG PO TABS
650.0000 mg | ORAL_TABLET | Freq: Four times a day (QID) | ORAL | Status: DC | PRN
  Filled 2020-07-28: qty 2

## 2020-07-28 MED ORDER — LACTATED RINGERS IV BOLUS
1000.0000 mL | Freq: Once | INTRAVENOUS | Status: AC
  Administered 2020-07-28: 1000 mL via INTRAVENOUS

## 2020-07-28 MED ORDER — ONDANSETRON HCL 4 MG/2ML IJ SOLN
4.0000 mg | Freq: Once | INTRAMUSCULAR | Status: AC
  Administered 2020-07-28: 4 mg via INTRAVENOUS
  Filled 2020-07-28: qty 2

## 2020-07-28 MED ORDER — POTASSIUM CHLORIDE 10 MEQ/100ML IV SOLN
10.0000 meq | INTRAVENOUS | Status: AC
  Administered 2020-07-28 (×3): 10 meq via INTRAVENOUS
  Filled 2020-07-28 (×4): qty 100

## 2020-07-28 MED ORDER — CHLORHEXIDINE GLUCONATE CLOTH 2 % EX PADS
6.0000 | MEDICATED_PAD | Freq: Every day | CUTANEOUS | Status: DC
  Administered 2020-07-28 – 2020-07-30 (×3): 6 via TOPICAL

## 2020-07-28 MED ORDER — SODIUM CHLORIDE 0.9 % IV BOLUS
1000.0000 mL | Freq: Once | INTRAVENOUS | Status: AC
  Administered 2020-07-28: 1000 mL via INTRAVENOUS

## 2020-07-28 MED ORDER — PIPERACILLIN-TAZOBACTAM 3.375 G IVPB 30 MIN
3.3750 g | Freq: Once | INTRAVENOUS | Status: AC
  Administered 2020-07-28: 3.375 g via INTRAVENOUS
  Filled 2020-07-28: qty 50

## 2020-07-28 MED ORDER — ENOXAPARIN SODIUM 30 MG/0.3ML ~~LOC~~ SOLN
30.0000 mg | SUBCUTANEOUS | Status: DC
  Administered 2020-07-28: 30 mg via SUBCUTANEOUS
  Filled 2020-07-28: qty 0.3

## 2020-07-28 MED ORDER — PIPERACILLIN-TAZOBACTAM 3.375 G IVPB: 3.3750 g | Freq: Three times a day (TID) | INTRAVENOUS | Status: DC

## 2020-07-28 MED ORDER — LACTATED RINGERS IV SOLN: INTRAVENOUS | Status: DC

## 2020-07-28 MED ORDER — POTASSIUM CHLORIDE 10 MEQ/100ML IV SOLN
10.0000 meq | INTRAVENOUS | Status: AC
  Administered 2020-07-28 (×5): 10 meq via INTRAVENOUS
  Filled 2020-07-28 (×3): qty 100

## 2020-07-28 MED ORDER — POTASSIUM CHLORIDE CRYS ER 20 MEQ PO TBCR
40.0000 meq | EXTENDED_RELEASE_TABLET | Freq: Once | ORAL | Status: AC
  Administered 2020-07-28: 40 meq via ORAL
  Filled 2020-07-28: qty 2

## 2020-07-28 MED ORDER — POTASSIUM CHLORIDE CRYS ER 20 MEQ PO TBCR
40.0000 meq | EXTENDED_RELEASE_TABLET | Freq: Once | ORAL | Status: DC
  Filled 2020-07-28: qty 2

## 2020-07-28 NOTE — ED Provider Notes (Signed)
[facility] EMERGENCY DEPARTMENT Provider Note   CSN: [id number] Arrival date & time: 07/28/20  0900     History Chief Complaint  Patient presents with  . Altered Mental Status    Stephanie Acevedo is a 74 y.o. female.  The history is provided by the patient and the spouse.  Altered Mental Status Presenting symptoms: disorientation   Severity:  Mild Most recent episode:  Today Episode history:  Single Timing:  Intermittent Progression:  Unchanged Context: dementia   Associated symptoms: abdominal pain, nausea and vomiting   Associated symptoms: no fever, no palpitations, no rash and no seizures        Past Medical History:  Diagnosis Date  . Dementia [facility])     Patient Active Problem List   Diagnosis Date Noted  . Hyperlipidemia associated with type 2 diabetes mellitus (HCC) 04/13/2020  . Underweight 04/13/2020  . Body mass index (BMI) of 19 or less in adult 04/13/2020  . Controlled type 2 diabetes mellitus without complication, without long-term current use of insulin (HCC) 12/14/2019  . Hypokalemia 12/11/2019  . Dementia without behavioral disturbance (HCC) 12/11/2019  . Joint stiffness of left lower leg 11/27/2019    History reviewed. No pertinent surgical history.   OB History   No obstetric history on file.     Family History  Problem Relation Age of Onset  . Dementia Mother 42    Social History   Tobacco Use  . Smoking status: Never Smoker  . Smokeless tobacco: Never Used  Vaping Use  . Vaping Use: Never used  Substance Use Topics  . Alcohol use: Never  . Drug use: Never    Home Medications Prior to Admission medications   Medication Sig Start Date End Date Taking? Authorizing Provider  Apoaequorin (PREVAGEN EXTRA STRENGTH PO) Take 1 tablet by mouth daily.    Yes [provider]  donepezil (ARICEPT) 10 MG tablet Take 1 tablet (10 mg total) by mouth at bedtime. 01/22/20  Yes Reed, Tiffany L, DO  Multiple Vitamin  (MULTIVITAMIN WITH MINERALS) TABS tablet Take 1 tablet by mouth daily.   Yes [provider]  rosuvastatin (CRESTOR) 5 MG tablet Take 1 tablet (5 mg total) by mouth daily. Patient taking differently: Take 5 mg by mouth at bedtime.  04/04/20  Yes Reed, Tiffany L, DO    Allergies    Patient has no known allergies.  Review of Systems   Review of Systems  Constitutional: Negative for chills and fever.  HENT: Negative for ear pain and sore throat.   Eyes: Negative for pain and visual disturbance.  Respiratory: Negative for cough and shortness of breath.   Cardiovascular: Negative for chest pain and palpitations.  Gastrointestinal: Positive for abdominal pain, nausea and vomiting.  Genitourinary: Negative for dysuria and hematuria.  Musculoskeletal: Negative for arthralgias and back pain.  Skin: Negative for color change and rash.  Neurological: Negative for seizures and syncope.  All other systems reviewed and are negative.   Physical Exam Updated Vital Signs  ED Triage Vitals  Enc Vitals Group     BP 07/28/20 0908 (!) 94/59     Pulse Rate 07/28/20 0908 61     Resp 07/28/20 0908 16     Temp 07/28/20 0908 98.1 F (36.7 C)     Temp Source 07/28/20 0908 Oral     SpO2 07/28/20 0908 100 %     Weight 07/28/20 0908 101 lb (45.8 kg)     Height 07/28/20 0908 5'  6" (1.676 m)     Head Circumference --      Peak Flow --      Pain Score 07/28/20 0916 0     Pain Loc --      Pain Edu? --      Excl. in GC? --     Physical Exam Vitals and nursing note reviewed.  Constitutional:      General: She is not in acute distress.    Appearance: She is well-developed.  HENT:     Head: Normocephalic and atraumatic.     Nose: Nose normal.     Mouth/Throat:     Mouth: Mucous membranes are dry.  Eyes:     Extraocular Movements: Extraocular movements intact.     Conjunctiva/sclera: Conjunctivae normal.     Pupils: Pupils are equal, round, and reactive to light.  Cardiovascular:      Rate and Rhythm: Normal rate and regular rhythm.     Pulses: Normal pulses.     Heart sounds: No murmur heard.   Pulmonary:     Effort: Pulmonary effort is normal. No respiratory distress.     Breath sounds: Normal breath sounds.  Abdominal:     Palpations: Abdomen is soft.     Tenderness: There is abdominal tenderness (diffuse). There is no guarding or rebound.  Musculoskeletal:     Cervical back: Neck supple.  Skin:    General: Skin is warm and dry.     Capillary Refill: Capillary refill takes less than 2 seconds.  Neurological:     General: No focal deficit present.     Mental Status: She is alert.     Cranial Nerves: No cranial nerve deficit.     Sensory: No sensory deficit.     Motor: No weakness.  Psychiatric:        Mood and Affect: Mood normal.     ED Results / Procedures / Treatments   Labs (all labs ordered are listed, but only abnormal results are displayed) Labs Reviewed  COMPREHENSIVE METABOLIC PANEL - Abnormal; Notable for the following components:      Result Value   Potassium <2.0 (*)    Glucose, Bld 198 (*)    All other components within normal limits  CBC - Abnormal; Notable for the following components:   WBC 11.0 (*)    Hemoglobin 11.7 (*)    HCT 34.5 (*)    All other components within normal limits  URINALYSIS, ROUTINE W REFLEX MICROSCOPIC - Abnormal; Notable for the following components:   Ketones, ur 20 (*)    Protein, ur 30 (*)    Bacteria, UA RARE (*)    All other components within normal limits  LACTIC ACID, PLASMA - Abnormal; Notable for the following components:   Lactic Acid, Venous 2.6 (*)    All other components within normal limits  CBG MONITORING, ED - Abnormal; Notable for the following components:   Glucose-Capillary 192 (*)    All other components within normal limits  RESPIRATORY PANEL BY RT PCR (FLU A&B, COVID)  URINE CULTURE  CULTURE, BLOOD (ROUTINE X 2)  CULTURE, BLOOD (ROUTINE X 2)  MAGNESIUM  LACTIC ACID, PLASMA    TROPONIN I (HIGH SENSITIVITY)  TROPONIN I (HIGH SENSITIVITY)  TROPONIN I (HIGH SENSITIVITY)    EKG EKG Interpretation  Date/Time:  Sunday July 28 2020 11:42:21 EST Ventricular Rate:  210 PR Interval:  146 QRS Duration: 173 QT Interval:  290 QTC Calculation: 540 R Axis:   -96 Text  Interpretation: Extreme tachycardia with wide complex, no further rhythm analysis attempted Confirmed by Virgina NorfolkAdam, Chad Donoghue [phone]([id number]) on 07/28/2020 11:55:45 AM   Radiology CT ABDOMEN PELVIS WO CONTRAST  Result Date: 07/28/2020 CLINICAL DATA:  Abdominal distension.  Dry heaves.  Disorientation. EXAM: CT ABDOMEN AND PELVIS WITHOUT CONTRAST TECHNIQUE: Multidetector CT imaging of the abdomen and pelvis was performed following the standard protocol without IV contrast. COMPARISON:  None. FINDINGS: Despite efforts by the technologist and patient, motion artifact is present on today's exam and could not be eliminated. This reduces exam sensitivity and specificity. Patient arm positioning by her sides also reduces signal to noise ratio. Lower chest: Descending thoracic aortic atherosclerosis. Hepatobiliary: Unremarkable Pancreas: Unremarkable Spleen: Unremarkable Adrenals/Urinary Tract: Foley catheter in the urinary bladder. No hydronephrosis, hydroureter, or definite urinary tract calculi. Adrenal glands unremarkable. Stomach/Bowel: Sigmoid colon diverticulosis with some minimal hazy stranding adjacent to the sigmoid colon on image 71 of series 5 which could reflect early low-grade acute diverticulitis. No extraluminal gas or abscess. No dilated bowel. Vascular/Lymphatic: Unremarkable Reproductive: Unremarkable Other: No supplemental non-categorized findings. Musculoskeletal: Unremarkable IMPRESSION: 1. Sigmoid colon diverticulosis with some minimal hazy stranding adjacent to the sigmoid colon which could reflect early low-grade acute diverticulitis. No extraluminal gas or abscess. 2. Aortic atherosclerosis. Aortic  Atherosclerosis (ICD10-I70.0). Electronically Signed   By: Gaylyn RongWalter  Liebkemann M.D.   On: 07/28/2020 13:28   CT Head Wo Contrast  Result Date: 07/28/2020 CLINICAL DATA:  Mental status change. EXAM: CT HEAD WITHOUT CONTRAST TECHNIQUE: Contiguous axial images were obtained from the base of the skull through the vertex without intravenous contrast. COMPARISON:  01/03/2020 FINDINGS: Brain: There is no evidence of an acute infarct, intracranial hemorrhage, mass, midline shift, or extra-axial fluid collection. Cerebral atrophy is unchanged with preferential involvement of the mesial temporal lobes. Hypodensities in the cerebral white matter bilaterally are unchanged and nonspecific but compatible with mild chronic small vessel ischemic disease. Vascular: Calcified atherosclerosis at the skull base. No hyperdense vessel. Skull: No fracture suspicious osseous lesion. Sinuses/Orbits: Visualized paranasal sinuses and mastoid air cells are clear. Unremarkable orbits. Other: None. IMPRESSION: 1. No evidence of acute intracranial abnormality. 2. 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 Electronically Signed   By: Sebastian AcheAllen  Grady M.D.   On: 07/28/2020 13:26   DG Chest Portable 1 View  Result Date: 07/28/2020 CLINICAL DATA:  Altered mental status. EXAM: PORTABLE CHEST 1 VIEW COMPARISON:  None. FINDINGS: Cardiomediastinal silhouette is normal. Mediastinal contours appear intact. There is no evidence of focal airspace consolidation, pleural effusion or pneumothorax. Osseous structures are without acute abnormality. Soft tissues are grossly normal. IMPRESSION: No active disease. Electronically Signed   By: Ted Mcalpineobrinka  Dimitrova M.D.   On: 07/28/2020 12:03    Procedures .Critical Care Performed by: Virgina Norfolkuratolo, Annye Forrey, DO Authorized by: Virgina Norfolkuratolo, Aviyah Swetz, DO   Critical care provider statement:    Critical care time (minutes):  65   Critical care was necessary to treat or prevent imminent or  life-threatening deterioration of the following conditions:  Metabolic crisis (potassium < 2.0)   Critical care was time spent personally by me on the following activities:  Blood draw for specimens, discussions with primary provider, evaluation of patient's response to treatment, examination of patient, obtaining history from patient or surrogate, ordering and performing treatments and interventions, ordering and review of laboratory studies, ordering and review of radiographic studies, pulse oximetry, re-evaluation of patient's condition and review of old charts   I assumed direction of critical care for this patient from another provider  in my specialty: no     (including critical care time)  Medications Ordered in ED Medications  potassium chloride 10 mEq in 100 mL IVPB (10 mEq Intravenous New Bag/Given 07/28/20 1431)  potassium chloride SA (KLOR-CON) CR tablet 40 mEq (40 mEq Oral Not Given 07/28/20 1156)  amiodarone (NEXTERONE) 1.8 mg/mL load via infusion 150 mg (150 mg Intravenous Bolus from Bag 07/28/20 1156)    Followed by  amiodarone (NEXTERONE PREMIX) 360-4.14 MG/200ML-% (1.8 mg/mL) IV infusion (60 mg/hr Intravenous New Bag/Given 07/28/20 1207)    Followed by  amiodarone (NEXTERONE PREMIX) 360-4.14 MG/200ML-% (1.8 mg/mL) IV infusion (has no administration in time range)  piperacillin-tazobactam (ZOSYN) IVPB 3.375 g (has no administration in time range)  sodium chloride 0.9 % bolus 1,000 mL (0 mLs Intravenous Stopped 07/28/20 1300)  ondansetron (ZOFRAN) injection 4 mg (4 mg Intravenous Given 07/28/20 1141)  magnesium sulfate IVPB 2 g 50 mL (0 g Intravenous Stopped 07/28/20 1257)  magnesium sulfate IVPB 2 g 50 mL (0 g Intravenous Stopped 07/28/20 1403)  lactated ringers bolus 1,000 mL (1,000 mLs Intravenous New Bag/Given 07/28/20 1334)  piperacillin-tazobactam (ZOSYN) IVPB 3.375 g (3.375 g Intravenous New Bag/Given 07/28/20 1431)    ED Course  I have reviewed the triage vital signs  and the nursing notes.  Pertinent labs & imaging results that were available during my care of the patient were reviewed by me and considered in my medical decision making (see chart for details).    MDM Rules/Calculators/A&P                          Carol Theys is a 74 year old female with history of dementia, high cholesterol presents to the ED with slightly altered mental status and generalized weakness and nausea.  Patient with overall unremarkable vitals.  Family reports that she has been dry heaving and having some intermittent nausea and vomiting for the last 2 days.  Has not had much to eat or drink during that time.  Overall the patient is pleasantly demented and able to answer questions.  Neurologically she is intact.  She has some mild abdominal discomfort to palpation.  Denies any headaches, chest pain, shortness of breath or cough or fever.  Lab work has already been done prior to my evaluation and shows potassium of less than 2.  Creatinine however is normal.  No significant kidney injury or leukocytosis.  Blood sugar unremarkable.  EKG shows sinus rhythm.  May be some hypokalemic changes.  Will replete potassium and magnesium.  Will give fluid bolus as well as Zofran.  Will expand work-up to include CT scan of the head.  Will get chest x-ray and urine studies.  Anticipate admission for further potassium repletion and symptomatic care.  3:15 PM patient had run of wide rhythm tachycardia, appeared to be torsades has been given amiodarone bolus and infusion as well as magnesium.  Patient is DNR.  Family does not want CPR or intubation.  Thus far she has been going in and out of wide rhythm tachycardia but she is breathing on her own and mentating well one in a normal rhythm.  Mostly complaining of nausea but no chest pain or other discomfort.  Patient is a found to have leukocytosis, lactic acidosis.  CT scan showed acute diverticulitis likely.  CT was unremarkable.  Magnesium was normal.   Overall she is improving following amiodarone, magnesium, potassium.  Still having some episodes of ventricular bigeminy and some for 5 beats  runs of V. tach.  Cardiology has evaluated the patient and believe as to why that this is likely metabolic being caused by electrolyte abnormality and infectious process.  No need for heparin at this time.  Family is amenable to cardioversion if has sustained run of V. tach but if she is pulseless and not breathing they do not want CPR DNR.  They also do not want repeated shocks either.  Will need some ongoing discussion with family about comfort care needs if needed however believe that patient will improve as we continue to replete electrolytes.  Cardiology is following along.  To be admitted to the ICU team for further monitoring as she is critically ill.  This chart was dictated using voice recognition software.  Despite best efforts to proofread,  errors can occur which can change the documentation meaning.    Final Clinical Impression(s) / ED Diagnoses Final diagnoses:  Hypokalemia  Dehydration  Altered mental status, unspecified altered mental status type  Torsades de pointes (HCC)  Acute diverticulitis    Rx / DC Orders ED Discharge Orders    None       Virgina Norfolk, DO 07/28/20 1515

## 2020-07-28 NOTE — Progress Notes (Signed)
Pharmacy Antibiotic Note  Stephanie Acevedo is a 74 y.o. female admitted on 07/28/2020 with AMS.  Pharmacy has been consulted for zosyn dosing for diverticulitis. Pt is afebrile and WBC elevated. SCr is WNL.  Plan: Zosyn 3.375mg  IV Q8H (4 hr inf) F/u renal fxn, C&S, clinical status   Height: 5\' 6"  (167.6 cm) Weight: 45.8 kg (101 lb) IBW/kg (Calculated) : 59.3  Temp (24hrs), Avg:98.1 F (36.7 C), Min:98.1 F (36.7 C), Max:98.1 F (36.7 C)  Recent Labs  Lab 07/28/20 0929 07/28/20 1135  WBC 11.0*  --   CREATININE 0.92  --   LATICACIDVEN  --  2.6*    Estimated Creatinine Clearance: 39.4 mL/min (by C-G formula based on SCr of 0.92 mg/dL).    No Known Allergies  Antimicrobials this admission: Zosyn 11/14>>  Dose adjustments this admission: N/A  Microbiology results: Pending  Thank you for allowing pharmacy to be a part of this patient's care.  Shaine Mount, 07/30/20 07/28/2020 1:47 PM

## 2020-07-28 NOTE — ED Notes (Signed)
Critical lab results- Potassium less than 2.  Dr. Particia Nearing and Charge RN notified.

## 2020-07-28 NOTE — ED Notes (Signed)
Calcium chloride IV push given per verbal orders

## 2020-07-28 NOTE — Progress Notes (Signed)
BMP Latest Ref Rng & Units 07/28/2020 07/28/2020 03/25/2020  Glucose 70 - 99 mg/dL 937(T) 024(O) 973(Z)  BUN 8 - 23 mg/dL 7(L) 8 15  Creatinine 3.29 - 1.00 mg/dL 9.24 2.68 3.41  BUN/Creat Ratio 6 - 22 (calc) - - NOT APPLICABLE  Sodium 135 - 145 mmol/L 145 140 140  Potassium 3.5 - 5.1 mmol/L 2.0(LL) <2.0(LL) 4.0  Chloride 98 - 111 mmol/L 108 98 107  CO2 22 - 32 mmol/L 24 28 29   Calcium 8.9 - 10.3 mg/dL 9.1 9.1 9.3    Will order IV and po potassium and f/u labs.  , MD [facility] Pulmonary/Critical Care Pager - [phone] 07/28/2020, 5:31 PM

## 2020-07-28 NOTE — ED Notes (Signed)
Pt denies any CP. Endorses nausea

## 2020-07-28 NOTE — ED Triage Notes (Signed)
Family reports pt with dry heaves x 2 days.  Hasn't ate in the last day.  Reports pt disoriented since this morning.  Hx of dementia but states she is normally alert and oriented.  Per alert to person and place.  States it is [birth year]. 1947.  Pt laughing.  Denies pain.  No arm drift. Denies weakness/numbness.

## 2020-07-28 NOTE — Progress Notes (Signed)
RT responded to code blue in ED room 12. Pt provided O2 through [facility] Bag by blow by due to heart rhythm change and then was placed on 4L [place]. Pt never lost pulses, compressions never initiated. RT will continue to closely monitor pt

## 2020-07-28 NOTE — Consult Note (Signed)
CARDIOLOGY CONSULT NOTE  Patient ID: Stephanie Acevedo MRN: [id number] DOB/AGE: September 30, 1945 74 y.o.  Admit date: 07/28/2020 Attending physician: Virgina Norfolk, DO Primary Physician:  Kermit Balo, DO Outpatient Cardiologist: None Inpatient Cardiologist: Tessa Lerner, DO, [facility]  Chief complaint: Nausea and vomiting Reason of consultation: Ventricular tachycardia  HPI:  Stephanie Acevedo is a 5 y.o. Caucasian female who presents with a chief complaint of " nausea and vomiting." Her past medical history includes dementia, hyperlipidemia.  Patient is accompanied by her husband [name] at bedside.  Patient presents to the hospital after nausea and vomiting which started on Friday.  Cardiology has been consulted for management of ventricular tachycardia.  Patient was seen stat in ER room bed 12 at approximately 12:40 PM for evaluation of ventricular tachycardia.  She denies any chest pain and appears mildly dyspneic on examination.  Patient's husband is providing majority of the history of present illness due to her underlying dementia.  It appears that the patient has been having dry heaves and nausea and vomiting since Friday, July 26, 2020.  Because of dry heaves continue to get a come to the hospital for further evaluation and management.  On EKG she is noted to have ventricular tachycardia consistent with torsades de point.   Electrolytes she note a potassium less than 2.  Magnesium pending.  First high sensitive troponin negative x1.  Initial vital signs recorded blood pressure 94/59, respiratory rate 16, pulse 61, temperature 98.1, 100% on room air.  Review of systems are positive for nausea, vomiting, shortness of breath, and lightheadedness.  No prior history of coronary artery disease, myocardial infarction, DVT/PE, congestive heart failure, strokes, TIA.  No prior history of gastrointestinal bleeding or intracranial bleeding.   ALLERGIES: No Known Allergies  PAST MEDICAL HISTORY: Past  Medical History:  Diagnosis Date  . Dementia (HCC)     PAST SURGICAL HISTORY: History reviewed. No pertinent surgical history.  None per husband.  FAMILY HISTORY: The patient family history includes Dementia (age of onset: 33) in her mother.  No family history of premature CAD.   SOCIAL HISTORY:  The patient  reports that she has never smoked. She has never used smokeless tobacco. She reports that she does not drink alcohol and does not use drugs.  Lives with husband.  MEDICATIONS: Current Outpatient Medications  Medication Instructions  . Apoaequorin (PREVAGEN EXTRA STRENGTH PO) Oral, Daily  . donepezil (ARICEPT) 10 mg, Oral, Daily at bedtime  . Multiple Vitamin (MULTIVITAMIN) tablet 1 tablet, Oral, 2 times daily  . rosuvastatin (CRESTOR) 5 mg, Oral, Daily    14 ORGAN REVIEW OF SYSTEMS: (Obtained by asking questions of the patient and husband) Review of Systems  Constitutional: Negative for chills and fever.  HENT: Negative for hoarse voice and nosebleeds.   Eyes: Negative for discharge, double vision and pain.  Cardiovascular: Negative for chest pain, claudication, dyspnea on exertion, leg swelling, near-syncope, orthopnea, palpitations, paroxysmal nocturnal dyspnea and syncope.  Respiratory: Positive for shortness of breath. Negative for hemoptysis.   Musculoskeletal: Negative for muscle cramps and myalgias.  Gastrointestinal: Positive for nausea and vomiting. Negative for abdominal pain, constipation, diarrhea, hematemesis, hematochezia and melena.  Neurological: Positive for light-headedness. Negative for dizziness.       Cognitive impairment  All other systems reviewed and are negative.   PHYSICAL EXAM: Vitals with BMI 07/28/2020 07/28/2020 07/28/2020  Height - - -  Weight - - -  BMI - - -  Systolic 116 117 824  Diastolic 70 84 55  Pulse  39 34 39   CONSTITUTIONAL: Pleasantly demented, frail appearing female, appears older than stated age, mild distress. SKIN: Skin  is  dry. No rash noted. No cyanosis. No pallor. No jaundice HEAD: Normocephalic and atraumatic.  EYES: No scleral icterus MOUTH/THROAT: Dry oral membranes.  NECK: No JVD present. No thyromegaly noted. No carotid bruits  LYMPHATIC: No visible cervical adenopathy.  CHEST Normal respiratory effort. No intercostal retractions  LUNGS: Clear to auscultation bilaterally.  No stridor. No wheezes. No rales.  CARDIOVASCULAR: Regular, positive S1-S2, soft holosystolic murmur heard at the apex, no gallops or rubs. ABDOMINAL: Nonobese, soft, mildly distended nontender, positive bowel sounds in all 4 quadrants, no apparent ascites.  EXTREMITIES: Cool to touch, no peripheral edema  HEMATOLOGIC: No significant bruising NEUROLOGIC: Oriented to person.  Not oriented to place, time, president. Nonfocal. Normal muscle tone.  PSYCHIATRIC: Normal mood and affect. Normal behavior. Cooperative  RADIOLOGY: CT ABDOMEN PELVIS WO CONTRAST  Result Date: 07/28/2020 CLINICAL DATA:  Abdominal distension.  Dry heaves.  Disorientation. EXAM: CT ABDOMEN AND PELVIS WITHOUT CONTRAST TECHNIQUE: Multidetector CT imaging of the abdomen and pelvis was performed following the standard protocol without IV contrast. COMPARISON:  None. FINDINGS: Despite efforts by the technologist and patient, motion artifact is present on today's exam and could not be eliminated. This reduces exam sensitivity and specificity. Patient arm positioning by her sides also reduces signal to noise ratio. Lower chest: Descending thoracic aortic atherosclerosis. Hepatobiliary: Unremarkable Pancreas: Unremarkable Spleen: Unremarkable Adrenals/Urinary Tract: Foley catheter in the urinary bladder. No hydronephrosis, hydroureter, or definite urinary tract calculi. Adrenal glands unremarkable. Stomach/Bowel: Sigmoid colon diverticulosis with some minimal hazy stranding adjacent to the sigmoid colon on image 71 of series 5 which could reflect early low-grade acute  diverticulitis. No extraluminal gas or abscess. No dilated bowel. Vascular/Lymphatic: Unremarkable Reproductive: Unremarkable Other: No supplemental non-categorized findings. Musculoskeletal: Unremarkable IMPRESSION: 1. Sigmoid colon diverticulosis with some minimal hazy stranding adjacent to the sigmoid colon which could reflect early low-grade acute diverticulitis. No extraluminal gas or abscess. 2. Aortic atherosclerosis. Aortic Atherosclerosis (ICD10-I70.0). Electronically Signed   By: Gaylyn RongWalter  Liebkemann M.D.   On: 07/28/2020 13:28   CT Head Wo Contrast  Result Date: 07/28/2020 CLINICAL DATA:  Mental status change. EXAM: CT HEAD WITHOUT CONTRAST TECHNIQUE: Contiguous axial images were obtained from the base of the skull through the vertex without intravenous contrast. COMPARISON:  01/03/2020 FINDINGS: Brain: There is no evidence of an acute infarct, intracranial hemorrhage, mass, midline shift, or extra-axial fluid collection. Cerebral atrophy is unchanged with preferential involvement of the mesial temporal lobes. Hypodensities in the cerebral white matter bilaterally are unchanged and nonspecific but compatible with mild chronic small vessel ischemic disease. Vascular: Calcified atherosclerosis at the skull base. No hyperdense vessel. Skull: No fracture suspicious osseous lesion. Sinuses/Orbits: Visualized paranasal sinuses and mastoid air cells are clear. Unremarkable orbits. Other: None. IMPRESSION: 1. No evidence of acute intracranial abnormality. 2. 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 Electronically Signed   By: Sebastian AcheAllen  Grady M.D.   On: 07/28/2020 13:26   DG Chest Portable 1 View  Result Date: 07/28/2020 CLINICAL DATA:  Altered mental status. EXAM: PORTABLE CHEST 1 VIEW COMPARISON:  None. FINDINGS: Cardiomediastinal silhouette is normal. Mediastinal contours appear intact. There is no evidence of focal airspace consolidation, pleural effusion or pneumothorax. Osseous  structures are without acute abnormality. Soft tissues are grossly normal. IMPRESSION: No active disease. Electronically Signed   By: Ted Mcalpineobrinka  Dimitrova M.D.   On: 07/28/2020 12:03    LABORATORY  DATA: Lab Results  Component Value Date   WBC 11.0 (H) 07/28/2020   HGB 11.7 (L) 07/28/2020   HCT 34.5 (L) 07/28/2020   MCV 88.5 07/28/2020   PLT 262 07/28/2020    Recent Labs  Lab 07/28/20 0929  NA 140  K <2.0*  CL 98  CO2 28  BUN 8  CREATININE 0.92  CALCIUM 9.1  PROT 6.9  BILITOT 0.6  ALKPHOS 51  ALT 23  AST 25  GLUCOSE 198*    Lipid Panel     Component Value Date/Time   CHOL 233 (H) 03/25/2020 0846   TRIG 202 (H) 03/25/2020 0846   HDL 64 03/25/2020 0846   CHOLHDL 3.6 03/25/2020 0846   LDLCALC 135 (H) 03/25/2020 0846    BNP (last 3 results) No results for input(s): BNP in the last 8760 hours.  HEMOGLOBIN A1C Lab Results  Component Value Date   HGBA1C 6.5 (H) 03/25/2020   MPG 140 03/25/2020    Cardiac Panel (last 3 results) Recent Labs    07/28/20 1135  TROPONINIHS 14   TSH Recent Labs    11/27/19 1540  TSH 1.17     CARDIAC DATABASE: EKG: 07/28/2020 9:02 AM: Sinus bradycardia, 57 bpm, normal axis, incomplete right bundle branch block, prolonged QT, possible U wave.  07/28/2020 11:42 AM: Wide-complex tachycardia, 210 bpm, suggestive of torsades de pointe.  Echocardiogram: None  IMPRESSION & RECOMMENDATIONS: Stephanie Acevedo is a 58 y.o. Caucasian female whose past medical history and cardiovascular risk factors include: Dementia, hyperlipidemia.  Ventricular tachycardia:  Most likely secondary to electrolyte abnormalities.  Potassium less than 2 on initial blood work.  I suspect she also has hypokalemia.  EKG initially showed normal sinus rhythm with incomplete right bundle branch block, prolonged QT and U waves.  Following EKG shows wide complex tachycardia suggestive of torsades de point.  Currently patient and her husband state no symptoms of  chest pain or anginal equivalent.  Initial high sensitive troponin negative x1.  I suspect that the recent ventricular tachycardia is most likely secondary to electrolyte abnormalities.  However, ischemic etiology cannot be ruled out.  If repeat troponins are elevated will initiate IV heparin drip per ACS protocol.  If she continues to have ventricular tachycardia after electrolyte replacement will consider ischemic evaluation prior to discharge based on family wishes.  Patient presents today with a CODE STATUS of DNR/DNI which was reconfirmed by the patient's husband at bedside.  Had a shared decision between the patient, her husband [name], ER physician Dr. Lockie Mola, and myself that if the patient does have sustained ventricular tachycardia she can be cardioverted.  However, if repeated shocks are needed we will have to contact the patient's husband for further wishe and to reevaluate goals of care.   Nausea and vomiting:  Per CT scan report findings suggestive of diverticulitis, WBC count of 11,000, lactic acid greater than 2.  Defer to primary team.  Hypokalemia: Currently getting parenteral replacements.  Also receiving parenteral magnesium replacements.  Hyperlipidemia: Hold statin therapy for now.  Dementia: Chronic and stable.   CRITICAL CARE Performed by: Tessa Lerner   Total critical care time: 86 minutes   Critical care time was exclusive of separately billable procedures and treating other patients.   Critical care was necessary to treat or prevent imminent or life-threatening deterioration.   Critical care was time spent personally by me on the following activities: development of treatment plan with patient and/husband [name] as well as nursing, discussions with consultants, evaluation of  patient's response to treatment, examination of patient, obtaining history from patient or surrogate, ordering and performing treatments and interventions, ordering and review of laboratory  studies, ordering and review of radiographic studies, pulse oximetry and re-evaluation of patient's condition.  Patient's husband's questions and concerns were addressed to his satisfaction. He voices understanding of the instructions provided during this encounter.   This note was created using a voice recognition software as a result there may be grammatical errors inadvertently enclosed that do not reflect the nature of this encounter. Every attempt is made to correct such errors.  Delilah Shan [facility]  Pager: [phone] Office: [phone] 07/28/2020, 2:01 PM

## 2020-07-28 NOTE — ED Notes (Signed)
Lab called to run magnesium

## 2020-07-28 NOTE — H&P (Signed)
NAME:  Stephanie Acevedo, MRN:  [id number], DOB:  April 02, 1946, LOS: 0 ADMISSION DATE:  07/28/2020, CONSULTATION DATE:  07/28/2020 REFERRING MD:  Dr. Lockie Mola, ER, CHIEF COMPLAINT:  Dry heaves  Brief History   74 yo female brought to ER with dry heaves and confusion.  Has baseline dementia but able to perform ADLs at home independently.  Found to have severe hypokalemia and developed torsades de point after.  Regained sinus rhythm.  Started on potassium replacement and amiodarone.  Hx from husband.  Significant Hospital Events   11/14 Admit  Consults:  Cardiology  Procedures:    Significant Diagnostic Tests:  CT head 11/14 >> mild chronic small vessel ischemia and cerebral atrophy CT abd/pelvis 11/14 >> atherosclerosis, sigmoid diverticulosis with minimal hazy stranding  Micro Data:  COVID/Flu 11/14 >> negative Blood 11/14 >> Urine 11/14 >> MRSA PCR 11/14 >>  Antimicrobials:  Zosyn 11/14   Interim history/subjective:  Repeats everything her husband just said. Denies chest pain, dyspnea, headache, abdominal pain.  Objective   Blood pressure 118/61, pulse 73, temperature 98.1 F (36.7 C), temperature source Oral, resp. rate (!) 21, height 5\' 6"  (1.676 m), weight 45.8 kg, SpO2 99 %.        Intake/Output Summary (Last 24 hours) at 07/28/2020 1542 Last data filed at 07/28/2020 1529 Gross per 24 hour  Intake 2233.67 ml  Output --  Net 2233.67 ml   Filed Weights   07/28/20 0908  Weight: 45.8 kg    Examination:  General - alert Eyes - pupils reactive ENT - no sinus tenderness, no stridor Cardiac - regular rate/rhythm, no murmur Chest - equal breath sounds b/l, no wheezing or rales Abdomen - soft, non tender, + bowel sounds Extremities - no cyanosis, clubbing, or edema Skin - no rashes Neuro - normal strength, moves extremities, follows commands Psych - pleasant demeanor   Resolved Hospital Problem list     Assessment & Plan:   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 Hx of HLD. - f/u BMET and replace potassium as needed - f/u Mg - continue amiodarone for now - cardiology consulted - continue crestor  Nausea with vomiting. - improved - monitor clinically - abdominal exam benign; do not think she has diverticulitis as was questioned on CT abd/pelvis, will d/c zosyn  History of dementia. - hold outpt aricept  Goals of care. - discussed with pt's husband.  He would be okay with ACLS meds and defibrillation during this acute episode since she seems like she has a reversible process.  He would not want CPR or intubation.    Best practice:  Diet: regular DVT prophylaxis: Lovenox GI prophylaxis: Not indicated Mobility: Bed rest Code Status: Limited resuscitation Family Communication: updated pt's husband at bedside Disposition: Will admit to PCCM for now; will ask [facility] to assume care from 11/15.  Labs   CBC: Recent Labs  Lab 07/28/20 0929  WBC 11.0*  HGB 11.7*  HCT 34.5*  MCV 88.5  PLT 262    Basic Metabolic Panel: Recent Labs  Lab 07/28/20 0929 07/28/20 1135  NA 140  --   K <2.0*  --   CL 98  --   CO2 28  --   GLUCOSE 198*  --   BUN 8  --   CREATININE 0.92  --   CALCIUM 9.1  --   MG  --  2.2   GFR: Estimated Creatinine Clearance: 39.4 mL/min (by C-G formula based on SCr of 0.92 mg/dL). Recent Labs  Lab 07/28/20 0929 07/28/20 1135  WBC 11.0*  --   LATICACIDVEN  --  2.6*    Liver Function Tests: Recent Labs  Lab 07/28/20 0929  AST 25  ALT 23  ALKPHOS 51  BILITOT 0.6  PROT 6.9  ALBUMIN 3.6   No results for input(s): LIPASE, AMYLASE in the last 168 hours. No results for input(s): AMMONIA in the last 168 hours.  ABG No results found for: PHART, PCO2ART, PO2ART, HCO3, TCO2, ACIDBASEDEF, O2SAT   Coagulation Profile: No results for input(s): INR, PROTIME in the last 168 hours.  Cardiac Enzymes: No results for input(s): CKTOTAL, CKMB, CKMBINDEX, TROPONINI in the last 168  hours.  HbA1C: Hgb A1c MFr Bld  Date/Time Value Ref Range Status  03/25/2020 08:46 AM 6.5 (H) <5.7 % of total Hgb Final    Comment:    For someone without known diabetes, a hemoglobin A1c value of 6.5% or greater indicates that they may have  diabetes and this should be confirmed with a follow-up  test. . For someone with known diabetes, a value <7% indicates  that their diabetes is well controlled and a value  greater than or equal to 7% indicates suboptimal  control. A1c targets should be individualized based on  duration of diabetes, age, comorbid conditions, and  other considerations. . Currently, no consensus exists regarding use of hemoglobin A1c for diagnosis of diabetes for children. Marland Kitchen   12/11/2019 11:44 AM 7.5 (H) <5.7 % of total Hgb Final    Comment:    For someone without known diabetes, a hemoglobin A1c value of 6.5% or greater indicates that they may have  diabetes and this should be confirmed with a follow-up  test. . For someone with known diabetes, a value <7% indicates  that their diabetes is well controlled and a value  greater than or equal to 7% indicates suboptimal  control. A1c targets should be individualized based on  duration of diabetes, age, comorbid conditions, and  other considerations. . Currently, no consensus exists regarding use of hemoglobin A1c for diagnosis of diabetes for children. .     CBG: Recent Labs  Lab 07/28/20 0921  GLUCAP 192*    Review of Systems:   Reviewed and negative.  Past Medical History  She,  has a past medical history of Dementia (HCC).   Surgical History   History reviewed. No pertinent surgical history.   Social History   reports that she has never smoked. She has never used smokeless tobacco. She reports that she does not drink alcohol and does not use drugs.   Family History   Her family history includes Dementia (age of onset: 98) in her mother.   Allergies No Known Allergies   Home  Medications  Prior to Admission medications   Medication Sig Start Date End Date Taking? Authorizing Provider  Apoaequorin (PREVAGEN EXTRA STRENGTH PO) Take 1 tablet by mouth daily.    Yes [provider]  donepezil (ARICEPT) 10 MG tablet Take 1 tablet (10 mg total) by mouth at bedtime. 01/22/20  Yes Reed, Tiffany L, DO  Multiple Vitamin (MULTIVITAMIN WITH MINERALS) TABS tablet Take 1 tablet by mouth daily.   Yes [provider]  rosuvastatin (CRESTOR) 5 MG tablet Take 1 tablet (5 mg total) by mouth daily. Patient taking differently: Take 5 mg by mouth at bedtime.  04/04/20  Yes Kermit Balo, DO     Signature:  Coralyn Helling, MD [facility] Pulmonary/Critical Care Pager - [phone] 07/28/2020,  3:56 PM

## 2020-07-29 ENCOUNTER — Inpatient Hospital Stay (HOSPITAL_COMMUNITY): Payer: Medicare Other

## 2020-07-29 DIAGNOSIS — I361 Nonrheumatic tricuspid (valve) insufficiency: Secondary | ICD-10-CM

## 2020-07-29 DIAGNOSIS — K5792 Diverticulitis of intestine, part unspecified, without perforation or abscess without bleeding: Secondary | ICD-10-CM

## 2020-07-29 DIAGNOSIS — I340 Nonrheumatic mitral (valve) insufficiency: Secondary | ICD-10-CM

## 2020-07-29 DIAGNOSIS — E876 Hypokalemia: Principal | ICD-10-CM

## 2020-07-29 LAB — URINE CULTURE: Culture: NO GROWTH

## 2020-07-29 LAB — BASIC METABOLIC PANEL
Anion gap: 6 (ref 5–15)
Anion gap: 11 (ref 5–15)
BUN: 8 mg/dL (ref 8–23)
BUN: 6 mg/dL — ABNORMAL LOW (ref 8–23)
CO2: 29 mmol/L (ref 22–32)
CO2: 23 mmol/L (ref 22–32)
Calcium: 8.1 mg/dL — ABNORMAL LOW (ref 8.9–10.3)
Calcium: 7.9 mg/dL — ABNORMAL LOW (ref 8.9–10.3)
Chloride: 106 mmol/L (ref 98–111)
Chloride: 107 mmol/L (ref 98–111)
Creatinine, Ser: 1.09 mg/dL — ABNORMAL HIGH (ref 0.44–1.00)
Creatinine, Ser: 1.12 mg/dL — ABNORMAL HIGH (ref 0.44–1.00)
GFR, Estimated: 54 mL/min — ABNORMAL LOW (ref >60)
GFR, Estimated: 52 mL/min — ABNORMAL LOW (ref >60)
Glucose, Bld: 169 mg/dL — ABNORMAL HIGH (ref 70–99)
Glucose, Bld: 194 mg/dL — ABNORMAL HIGH (ref 70–99)
Potassium: 2.6 mmol/L — CL (ref 3.5–5.1)
Potassium: 2.9 mmol/L — ABNORMAL LOW (ref 3.5–5.1)
Sodium: 141 mmol/L (ref 135–145)
Sodium: 141 mmol/L (ref 135–145)

## 2020-07-29 LAB — ECHOCARDIOGRAM COMPLETE
Area-P 1/2: 3.37 cm2
Height: 66 in
MV M vel: 5.15 m/s
MV Peak grad: 106.1 mmHg
S' Lateral: 3.3 cm
Weight: 1616 oz

## 2020-07-29 LAB — CBC
HCT: 28.8 % — ABNORMAL LOW (ref 36.0–46.0)
Hemoglobin: 9.6 g/dL — ABNORMAL LOW (ref 12.0–15.0)
MCH: 29.4 pg (ref 26.0–34.0)
MCHC: 33.3 g/dL (ref 30.0–36.0)
MCV: 88.3 fL (ref 80.0–100.0)
Platelets: 196 10*3/uL (ref 150–400)
RBC: 3.26 MIL/uL — ABNORMAL LOW (ref 3.87–5.11)
RDW: 13.5 % (ref 11.5–15.5)
WBC: 11.5 10*3/uL — ABNORMAL HIGH (ref 4.0–10.5)
nRBC: 0 % (ref 0.0–0.2)

## 2020-07-29 LAB — PHOSPHORUS: Phosphorus: 3.1 mg/dL (ref 2.5–4.6)

## 2020-07-29 LAB — MAGNESIUM
Magnesium: 2.6 mg/dL — ABNORMAL HIGH (ref 1.7–2.4)
Magnesium: 2.2 mg/dL (ref 1.7–2.4)

## 2020-07-29 MED ORDER — ENOXAPARIN SODIUM 40 MG/0.4ML ~~LOC~~ SOLN: 40.0000 mg | SUBCUTANEOUS | Status: DC

## 2020-07-29 MED ORDER — POTASSIUM CHLORIDE CRYS ER 20 MEQ PO TBCR
40.0000 meq | EXTENDED_RELEASE_TABLET | ORAL | Status: AC
  Administered 2020-07-29 (×4): 40 meq via ORAL
  Filled 2020-07-29 (×4): qty 2

## 2020-07-29 MED ORDER — ENOXAPARIN SODIUM 30 MG/0.3ML ~~LOC~~ SOLN
30.0000 mg | SUBCUTANEOUS | Status: DC
  Administered 2020-07-29 – 2020-08-01 (×4): 30 mg via SUBCUTANEOUS
  Filled 2020-07-29 (×4): qty 0.3

## 2020-07-29 MED ORDER — PIPERACILLIN-TAZOBACTAM 3.375 G IVPB
3.3750 g | Freq: Three times a day (TID) | INTRAVENOUS | Status: DC
  Administered 2020-07-29 – 2020-08-02 (×12): 3.375 g via INTRAVENOUS
  Filled 2020-07-29 (×12): qty 50

## 2020-07-29 MED ORDER — POTASSIUM CHLORIDE 10 MEQ/100ML IV SOLN
10.0000 meq | INTRAVENOUS | Status: AC
  Administered 2020-07-29 (×6): 10 meq via INTRAVENOUS
  Filled 2020-07-29: qty 100

## 2020-07-29 MED ORDER — POTASSIUM CHLORIDE 10 MEQ/50ML IV SOLN
10.0000 meq | INTRAVENOUS | Status: DC
Start: 2020-07-29 — End: 2020-07-29

## 2020-07-29 MED ORDER — METOPROLOL SUCCINATE ER 25 MG PO TB24
12.5000 mg | ORAL_TABLET | Freq: Every day | ORAL | Status: DC
  Administered 2020-07-29 – 2020-08-02 (×5): 12.5 mg via ORAL
  Filled 2020-07-29 (×6): qty 1

## 2020-07-29 MED ORDER — SODIUM CHLORIDE 0.9 % IV BOLUS
1000.0000 mL | Freq: Once | INTRAVENOUS | Status: AC
  Administered 2020-07-29: 1000 mL via INTRAVENOUS

## 2020-07-29 NOTE — Progress Notes (Signed)
Progress Note  Patient Name: Stephanie Acevedo Date of Encounter: 07/29/2020  Attending physician: Meredeth Ide, MD Primary care provider: Kermit Balo, DO Primary Cardiologist:  Consultant:Oral Hallgren Odis Hollingshead, DO  Subjective: Stephanie Acevedo is a 74 y.o. female who was seen and examined at bedside at approximately 8:45 AM No events overnight. Patient denies any chest pain at rest or with effort related activities, no shortness of breath at rest or with effort related activities, no orthopnea, paroxysmal nocturnal dyspnea or lower extremity swelling. Patient remains hypokalemic but her rhythm has stabilized overnight. She is more awake alert and oriented compared to yesterday was seen in ED. Husband is not present at bedside. Case discussed and reviewed with her nurse.  Objective: Vital Signs in the last 24 hours: Temp:  [97.7 F (36.5 C)-98.7 F (37.1 C)] 98.3 F (36.8 C) (11/15 0700) Pulse Rate:  [34-112] 70 (11/15 0800) Resp:  [14-22] 17 (11/15 0800) BP: (80-180)/(36-94) 101/71 (11/15 0800) SpO2:  [92 %-100 %] 96 % (11/15 0800)  Intake/Output:  Intake/Output Summary (Last 24 hours) at 07/29/2020 0939 Last data filed at 07/29/2020 0800 Gross per 24 hour  Intake 5340.69 ml  Output 415 ml  Net 4925.69 ml    Net IO Since Admission: 4,925.69 mL [07/29/20 0939]  Weights:  Filed Weights   07/28/20 0908  Weight: 45.8 kg    Telemetry: Personally reviewed.  Normal sinus rhythm.  Physical examination: PHYSICAL EXAM: Vitals with BMI 07/29/2020 07/29/2020 07/29/2020  Height - - -  Weight - - -  BMI - - -  Systolic 101 128 673  Diastolic 71 84 71  Pulse 70 82 -    CONSTITUTIONAL: Appears older than stated age, frail, hemodynamically stable, no acute distress.   SKIN: Skin is warm and dry. No rash noted. No cyanosis. No pallor. No jaundice HEAD: Normocephalic and atraumatic.  EYES: No scleral icterus MOUTH/THROAT: Moist oral membranes.  NECK: No JVD present. No thyromegaly  noted.  Right carotid bruit LYMPHATIC: No visible cervical adenopathy.  CHEST Normal respiratory effort. No intercostal retractions  LUNGS: Clear to auscultation bilaterally.  No stridor. No wheezes. No rales.  CARDIOVASCULAR: Regular, positive S1-S2, soft holosystolic murmur heard at the apex, no gallops or rubs. ABDOMINAL: Cachectic, nonobese, soft, mildly distended, nontender, positive bowel sounds, no apparent ascites.  EXTREMITIES: No peripheral edema  HEMATOLOGIC: No significant bruising NEUROLOGIC: Oriented to person, place, and time. Nonfocal. Normal muscle tone.  PSYCHIATRIC: Normal mood and affect. Normal behavior. Cooperative  Lab Results: Hematology Recent Labs  Lab 07/28/20 0929 07/29/20 0102  WBC 11.0* 11.5*  RBC 3.90 3.26*  HGB 11.7* 9.6*  HCT 34.5* 28.8*  MCV 88.5 88.3  MCH 30.0 29.4  MCHC 33.9 33.3  RDW 13.2 13.5  PLT 262 196    Chemistry Recent Labs  Lab 07/28/20 0929 07/28/20 1654 07/29/20 0059  NA 140 145 141  K <2.0* 2.0* 2.6*  CL 98 108 106  CO2 28 24 29   GLUCOSE 198* 153* 169*  BUN 8 7* 8  CREATININE 0.92 0.89 1.09*  CALCIUM 9.1 9.1 8.1*  PROT 6.9  --   --   ALBUMIN 3.6  --   --   AST 25  --   --   ALT 23  --   --   ALKPHOS 51  --   --   BILITOT 0.6  --   --   GFRNONAA >60 >60 54*  ANIONGAP 14 13 6      Cardiac Enzymes: Cardiac Panel (last 3  results) Recent Labs    07/28/20 1135 07/28/20 1432  TROPONINIHS 14 17    BNP (last 3 results) No results for input(s): BNP in the last 8760 hours.  ProBNP (last 3 results) No results for input(s): PROBNP in the last 8760 hours.   DDimer No results for input(s): DDIMER in the last 168 hours.   Hemoglobin A1c:  Lab Results  Component Value Date   HGBA1C 6.5 (H) 03/25/2020   MPG 140 03/25/2020    TSH  Recent Labs    11/27/19 1540  TSH 1.17    Lipid Panel     Component Value Date/Time   CHOL 233 (H) 03/25/2020 0846   TRIG 202 (H) 03/25/2020 0846   HDL 64 03/25/2020 0846    CHOLHDL 3.6 03/25/2020 0846   LDLCALC 135 (H) 03/25/2020 0846    Imaging: CT ABDOMEN PELVIS WO CONTRAST  Result Date: 07/28/2020 CLINICAL DATA:  Abdominal distension.  Dry heaves.  Disorientation. EXAM: CT ABDOMEN AND PELVIS WITHOUT CONTRAST TECHNIQUE: Multidetector CT imaging of the abdomen and pelvis was performed following the standard protocol without IV contrast. COMPARISON:  None. FINDINGS: Despite efforts by the technologist and patient, motion artifact is present on today's exam and could not be eliminated. This reduces exam sensitivity and specificity. Patient arm positioning by her sides also reduces signal to noise ratio. Lower chest: Descending thoracic aortic atherosclerosis. Hepatobiliary: Unremarkable Pancreas: Unremarkable Spleen: Unremarkable Adrenals/Urinary Tract: Foley catheter in the urinary bladder. No hydronephrosis, hydroureter, or definite urinary tract calculi. Adrenal glands unremarkable. Stomach/Bowel: Sigmoid colon diverticulosis with some minimal hazy stranding adjacent to the sigmoid colon on image 71 of series 5 which could reflect early low-grade acute diverticulitis. No extraluminal gas or abscess. No dilated bowel. Vascular/Lymphatic: Unremarkable Reproductive: Unremarkable Other: No supplemental non-categorized findings. Musculoskeletal: Unremarkable IMPRESSION: 1. Sigmoid colon diverticulosis with some minimal hazy stranding adjacent to the sigmoid colon which could reflect early low-grade acute diverticulitis. No extraluminal gas or abscess. 2. Aortic atherosclerosis. Aortic Atherosclerosis (ICD10-I70.0). Electronically Signed   By: Gaylyn Rong M.D.   On: 07/28/2020 13:28   CT Head Wo Contrast  Result Date: 07/28/2020 CLINICAL DATA:  Mental status change. EXAM: CT HEAD WITHOUT CONTRAST TECHNIQUE: Contiguous axial images were obtained from the base of the skull through the vertex without intravenous contrast. COMPARISON:  01/03/2020 FINDINGS: Brain: There is  no evidence of an acute infarct, intracranial hemorrhage, mass, midline shift, or extra-axial fluid collection. Cerebral atrophy is unchanged with preferential involvement of the mesial temporal lobes. Hypodensities in the cerebral white matter bilaterally are unchanged and nonspecific but compatible with mild chronic small vessel ischemic disease. Vascular: Calcified atherosclerosis at the skull base. No hyperdense vessel. Skull: No fracture suspicious osseous lesion. Sinuses/Orbits: Visualized paranasal sinuses and mastoid air cells are clear. Unremarkable orbits. Other: None. IMPRESSION: 1. No evidence of acute intracranial abnormality. 2. 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 Electronically Signed   By: Sebastian Ache M.D.   On: 07/28/2020 13:26   DG Chest Portable 1 View  Result Date: 07/28/2020 CLINICAL DATA:  Altered mental status. EXAM: PORTABLE CHEST 1 VIEW COMPARISON:  None. FINDINGS: Cardiomediastinal silhouette is normal. Mediastinal contours appear intact. There is no evidence of focal airspace consolidation, pleural effusion or pneumothorax. Osseous structures are without acute abnormality. Soft tissues are grossly normal. IMPRESSION: No active disease. Electronically Signed   By: Ted Mcalpine M.D.   On: 07/28/2020 12:03    Cardiac database: EKG: 07/28/2020 9:02 AM: Sinus bradycardia, 57 bpm, normal  axis, incomplete right bundle branch block, prolonged QT, possible U wave.  07/28/2020 11:42 AM: Wide-complex tachycardia, 210 bpm, suggestive of torsades de pointe.  Echocardiogram: None  Scheduled Meds: . Chlorhexidine Gluconate Cloth  6 each Topical Daily  . enoxaparin (LOVENOX) injection  30 mg Subcutaneous Q24H  . rosuvastatin  5 mg Oral QHS    Continuous Infusions: . amiodarone 30 mg/hr (07/29/20 0800)  . lactated ringers Stopped (07/29/20 0741)  . potassium chloride 10 mEq (07/29/20 0913)    PRN Meds: acetaminophen   IMPRESSION &  RECOMMENDATIONS: Stephanie Acevedo is a 74 y.o. female whose past medical history and cardiac risk factors include: Hyperlipidemia, dementia, advanced age, postmenopausal female.  Ventricular tachycardia: Patient was having episodes of nonsustained ventricular tachycardia and torsades de pointe on presentation most likely secondary to electrolyte abnormalities.  She was found to be profoundly hypokalemic.  She was transitioned to ICU for further monitoring given her underlying ventricular tachycardia.  Once electrolytes were being replaced and her ventricular tachycardia burden decreased she progressed to have ventricular bigeminy with underlying sinus.  Overnight patient remained stable and is currently in normal sinus rhythm with rare ectopic beats.  She is more awake and alert and oriented compared to yesterday's evaluation.  She denies any active chest pain or anginal symptoms.  Her troponins were also negative x2.  We discussed at length that her underlying ventricular tachycardia most likely is from electrolyte normalities but ischemia cannot be entirely ruled out.  Shared decision with the patient was to proceed with echocardiogram for now.  She does not want to undergo left heart catheterization or stress test during this hospitalization.  Once results of the echocardiogram are available we will discuss with both the patient and her husband on regarding goals of care and if they would like to proceed with ischemic evaluation as either outpatient or inpatient.   Discontinue amiodarone for now.  We will continue to monitor for recurrent NSVT Check EKG We will start low-dose Toprol-XL. Echocardiogram will be ordered to evaluate for structural heart disease and left ventricular systolic function. Further ischemic evaluation depending on echo findings and goals of care per family Patient remains hypokalemic, electrolyte replacements per primary team goal potassium of 4 and magnesium level of 2. Continue  telemetry. Patient can be transferred from the ICU to cardiac telemetry if beds are needed.  Hypokalemia: Continues to get parenteral supplements.  Managed by primary team.  Anemia: Work-up to be deferred to primary team.  Intractable nausea and vomiting on admission: Improving.  Imaging studies suggest underlying diverticulosis with possible diverticulitis.  Currently managed per primary team.  Hyperlipidemia: Recommend restarting home dose statin therapy.  Dementia: Patient's mental status is improved compared to yesterday.  Managed per primary team  Patient's questions and concerns were addressed to her satisfaction. She voices understanding of the instructions provided during this encounter.   This note was created using a voice recognition software as a result there may be grammatical errors inadvertently enclosed that do not reflect the nature of this encounter. Every attempt is made to correct such errors.  Total time spent: 35 minutes.  Plan of care discussed with both the patient and her nurse.  Tessa Lerner, DO, [facility] Office: [phone] 07/29/2020, 9:39 AM

## 2020-07-29 NOTE — Progress Notes (Signed)
[facility]  PROGRESS NOTE  Stephanie Acevedo VOJ:[id number] DOB: Jun 11, 1946 DOA: 07/28/2020 PCP: Kermit Balo, DO   Brief HPI:   74 year old female with medical history of dementia, hyperlipidemia was brought to ED after she had nausea vomiting which started on Friday.  In the ED on EKG she was noted to have V. tach consistent with torsade de pointes.  Patient received potassium and magnesium in the ED.  She was found to have profound hypokalemia with potassium less than 2 on initial blood work.  Magnesium was 2.3.  She was also started on IV amiodarone.  Subjective   Patient seen and examined, she did not did not have any PVCs overnight.  No further episodes of V. tach.   Assessment/Plan:    1. Ventricular tachycardia-likely from electrolyte abnormalities, patient had severe hypokalemia, potassium is being replaced.  This morning potassium was 2.9.  Magnesium was 2.3.  Patient was initially started on amiodarone infusion which has been discontinued per cardiology.  Patient started on low-dose Toprol-XL.  Echocardiogram has been ordered.  We will follow results, further ischemic evaluation based on echo findings.  Goal to keep potassium level more than 4 and magnesium level more than 2. 2. Hypokalemia-potassium is 2.9 this morning, will start K. Dur 40 mEq p.o. every 4 hours x4 doses.  Check BMP in am. 3. Sigmoid diverticulitis-seen on CT abdomen/pelvis, will start Zosyn per pharmacy consultation.  Patient can be discharged on Augmentin p.o. twice a day for 5 more days. 4. Dementia-no behavior disturbance, will monitor. 5. Hyperlipidemia-continue rosuvastatin 5 mg daily.   COVID-19 Labs  No results for input(s): DDIMER, FERRITIN, LDH, CRP in the last 72 hours.  Lab Results  Component Value Date   SARSCOV2NAA NEGATIVE 07/28/2020   SARSCOV2NAA Detected (A) 09/25/2019     Scheduled medications:   . Chlorhexidine Gluconate Cloth  6 each Topical Daily  . enoxaparin (LOVENOX)  injection  30 mg Subcutaneous Q24H  . metoprolol succinate  12.5 mg Oral Daily  . potassium chloride  40 mEq Oral Q4H  . rosuvastatin  5 mg Oral QHS     CBG: Recent Labs  Lab 07/28/20 0921  GLUCAP 192*    SpO2: 98 % O2 Flow Rate (L/min): 2 L/min    CBC: Recent Labs  Lab 07/28/20 0929 07/29/20 0102  WBC 11.0* 11.5*  HGB 11.7* 9.6*  HCT 34.5* 28.8*  MCV 88.5 88.3  PLT 262 196    Basic Metabolic Panel: Recent Labs  Lab 07/28/20 0929 07/28/20 1135 07/28/20 1654 07/29/20 0059 07/29/20 0836  NA 140  --  145 141 141  K <2.0*  --  2.0* 2.6* 2.9*  CL 98  --  108 106 107  CO2 28  --  24 29 23   GLUCOSE 198*  --  153* 169* 194*  BUN 8  --  7* 8 6*  CREATININE 0.92  --  0.89 1.09* 1.12*  CALCIUM 9.1  --  9.1 8.1* 7.9*  MG  --  2.2  --  2.6*  --   PHOS  --   --   --  3.1  --      Liver Function Tests: Recent Labs  Lab 07/28/20 0929  AST 25  ALT 23  ALKPHOS 51  BILITOT 0.6  PROT 6.9  ALBUMIN 3.6     Antibiotics: Anti-infectives (From admission, onward)   Start     Dose/Rate Route Frequency Ordered Stop   07/29/20 1300  piperacillin-tazobactam (ZOSYN) IVPB 3.375 g  3.375 g 12.5 mL/hr over 240 Minutes Intravenous Every 8 hours 07/29/20 1045     07/28/20 2100  piperacillin-tazobactam (ZOSYN) IVPB 3.375 g  Status:  Discontinued        3.375 g 12.5 mL/hr over 240 Minutes Intravenous Every 8 hours 07/28/20 1344 07/28/20 1538   07/28/20 1345  piperacillin-tazobactam (ZOSYN) IVPB 3.375 g        3.375 g 100 mL/hr over 30 Minutes Intravenous  Once 07/28/20 1344 07/28/20 1515       DVT prophylaxis: Lovenox  Code Status: Full code  Family Communication: No family at bedside   Consultants:  Cardiology  PCCM  Procedures:      Objective   Vitals:   07/29/20 1100 07/29/20 1200 07/29/20 1238 07/29/20 1300  BP: 123/66 120/60  115/66  Pulse: 63 64  67  Resp: 18 16  19   Temp:   98.4 F (36.9 C)   TempSrc:   Oral   SpO2: 100% 100%  98%   Weight:      Height:        Intake/Output Summary (Last 24 hours) at 07/29/2020 1354 Last data filed at 07/29/2020 1130 Gross per 24 hour  Intake 4428.64 ml  Output 790 ml  Net 3638.64 ml    11/13 1901 - 11/15 0700 In: 5119.5 [P.O.:300; I.V.:706.1] Out: 415 [Urine:415]  Filed Weights   07/28/20 0908  Weight: 45.8 kg    Physical Examination:    General-appears in no acute distress  Heart-S1-S2, regular, no murmur auscultated  Lungs-clear to auscultation bilaterally, no wheezing or crackles auscultated  Abdomen-soft, nontender, no organomegaly  Extremities-no edema in the lower extremities  Neuro-alert, oriented x3, no focal deficit noted   Status is: Inpatient  Dispo: The patient is from: Home              Anticipated d/c is to: Home              Anticipated d/c date is: 07/30/2020              Patient currently not medically stable for discharge  Barrier to discharge-ongoing management for ventricular tachycardia     Data Reviewed:   Recent Results (from the past 240 hour(s))  Respiratory Panel by RT PCR (Flu A&B, Covid) - Nasopharyngeal Swab     Status: None   Collection Time: 07/28/20 11:38 AM   Specimen: Nasopharyngeal Swab  Result Value Ref Range Status   SARS Coronavirus 2 by RT PCR NEGATIVE NEGATIVE Final    Comment: (NOTE) SARS-CoV-2 target nucleic acids are NOT DETECTED.  The SARS-CoV-2 RNA is generally detectable in upper respiratoy specimens during the acute phase of infection. The lowest concentration of SARS-CoV-2 viral copies this assay can detect is 131 copies/mL. A negative result does not preclude SARS-Cov-2 infection and should not be used as the sole basis for treatment or other patient management decisions. A negative result may occur with  improper specimen collection/handling, submission of specimen other than nasopharyngeal swab, presence of viral mutation(s) within the areas targeted by this assay, and inadequate number of  viral copies (<131 copies/mL). A negative result must be combined with clinical observations, patient history, and epidemiological information. The expected result is Negative.  Fact Sheet for Patients:  https://www.moore.com/https://www.fda.gov/media/142436/download  Fact Sheet for Healthcare Providers:  https://www.young.biz/https://www.fda.gov/media/142435/download  This test is no t yet approved or cleared by the [place] FDA and  has been authorized for detection and/or diagnosis of SARS-CoV-2 by FDA under an Emergency Use Authorization (  EUA). This EUA will remain  in effect (meaning this test can be used) for the duration of the COVID-19 declaration under Section 564(b)(1) of the Act, 21 U.S.C. section 360bbb-3(b)(1), unless the authorization is terminated or revoked sooner.     Influenza A by PCR NEGATIVE NEGATIVE Final   Influenza B by PCR NEGATIVE NEGATIVE Final    Comment: (NOTE) The Xpert Xpress SARS-CoV-2/FLU/RSV assay is intended as an aid in  the diagnosis of influenza from Nasopharyngeal swab specimens and  should not be used as a sole basis for treatment. Nasal washings and  aspirates are unacceptable for Xpert Xpress SARS-CoV-2/FLU/RSV  testing.  Fact Sheet for Patients: https://www.moore.com/  Fact Sheet for Healthcare Providers: https://www.young.biz/  This test is not yet approved or cleared by the [place] FDA and  has been authorized for detection and/or diagnosis of SARS-CoV-2 by  FDA under an Emergency Use Authorization (EUA). This EUA will remain  in effect (meaning this test can be used) for the duration of the  Covid-19 declaration under Section 564(b)(1) of the Act, 21  U.S.C. section 360bbb-3(b)(1), unless the authorization is  terminated or revoked. Performed at [facility], [address] [place], [place] [address]   Urine culture     Status: None   Collection Time: 07/28/20 12:36 PM   Specimen: Urine, Random  Result Value Ref  Range Status   Specimen Description URINE, RANDOM  Final   Special Requests NONE  Final   Culture   Final    NO GROWTH Performed at [facility], [address] [place], [place] [address]    Report Status 07/29/2020 FINAL  Final  Blood culture (routine x 2)     Status: None (Preliminary result)   Collection Time: 07/28/20  1:37 PM   Specimen: BLOOD  Result Value Ref Range Status   Specimen Description BLOOD RIGHT ANTECUBITAL  Final   Special Requests   Final    AEROBIC BOTTLE ONLY Blood Culture results may not be optimal due to an inadequate volume of blood received in culture bottles   Culture   Final    NO GROWTH < 24 HOURS Performed at [facility], [address], [place], [place] [address]    Report Status PENDING  Incomplete  Blood culture (routine x 2)     Status: None (Preliminary result)   Collection Time: 07/28/20  2:32 PM   Specimen: BLOOD  Result Value Ref Range Status   Specimen Description BLOOD SITE NOT SPECIFIED  Final   Special Requests   Final    BOTTLES DRAWN AEROBIC ONLY Blood Culture results may not be optimal due to an inadequate volume of blood received in culture bottles   Culture   Final    NO GROWTH < 24 HOURS Performed at [facility], [address], [place], [place] [address]    Report Status PENDING  Incomplete  MRSA PCR Screening     Status: None   Collection Time: 07/28/20  6:25 PM   Specimen: Nasopharyngeal  Result Value Ref Range Status   MRSA by PCR NEGATIVE NEGATIVE Final    Comment:        The GeneXpert MRSA Assay (FDA approved for NASAL specimens only), is one component of a comprehensive MRSA colonization surveillance program. It is not intended to diagnose MRSA infection nor to guide or monitor treatment for MRSA infections. Performed at [facility], [address], [place], [place] [address]  No results for input(s): LIPASE, AMYLASE in the last 168 hours. No results for input(s): AMMONIA in the  last 168 hours.  Cardiac Enzymes: No results for input(s): CKTOTAL, CKMB, CKMBINDEX, TROPONINI in the last 168 hours. BNP (last 3 results) No results for input(s): BNP in the last 8760 hours.  ProBNP (last 3 results) No results for input(s): PROBNP in the last 8760 hours.  Studies:  CT ABDOMEN PELVIS WO CONTRAST  Result Date: 07/28/2020 CLINICAL DATA:  Abdominal distension.  Dry heaves.  Disorientation. EXAM: CT ABDOMEN AND PELVIS WITHOUT CONTRAST TECHNIQUE: Multidetector CT imaging of the abdomen and pelvis was performed following the standard protocol without IV contrast. COMPARISON:  None. FINDINGS: Despite efforts by the technologist and patient, motion artifact is present on today's exam and could not be eliminated. This reduces exam sensitivity and specificity. Patient arm positioning by her sides also reduces signal to noise ratio. Lower chest: Descending thoracic aortic atherosclerosis. Hepatobiliary: Unremarkable Pancreas: Unremarkable Spleen: Unremarkable Adrenals/Urinary Tract: Foley catheter in the urinary bladder. No hydronephrosis, hydroureter, or definite urinary tract calculi. Adrenal glands unremarkable. Stomach/Bowel: Sigmoid colon diverticulosis with some minimal hazy stranding adjacent to the sigmoid colon on image 71 of series 5 which could reflect early low-grade acute diverticulitis. No extraluminal gas or abscess. No dilated bowel. Vascular/Lymphatic: Unremarkable Reproductive: Unremarkable Other: No supplemental non-categorized findings. Musculoskeletal: Unremarkable IMPRESSION: 1. Sigmoid colon diverticulosis with some minimal hazy stranding adjacent to the sigmoid colon which could reflect early low-grade acute diverticulitis. No extraluminal gas or abscess. 2. Aortic atherosclerosis. Aortic Atherosclerosis (ICD10-I70.0). Electronically Signed   By: Gaylyn Rong M.D.   On: 07/28/2020 13:28   CT Head Wo Contrast  Result Date: 07/28/2020 CLINICAL DATA:  Mental  status change. EXAM: CT HEAD WITHOUT CONTRAST TECHNIQUE: Contiguous axial images were obtained from the base of the skull through the vertex without intravenous contrast. COMPARISON:  01/03/2020 FINDINGS: Brain: There is no evidence of an acute infarct, intracranial hemorrhage, mass, midline shift, or extra-axial fluid collection. Cerebral atrophy is unchanged with preferential involvement of the mesial temporal lobes. Hypodensities in the cerebral white matter bilaterally are unchanged and nonspecific but compatible with mild chronic small vessel ischemic disease. Vascular: Calcified atherosclerosis at the skull base. No hyperdense vessel. Skull: No fracture suspicious osseous lesion. Sinuses/Orbits: Visualized paranasal sinuses and mastoid air cells are clear. Unremarkable orbits. Other: None. IMPRESSION: 1. No evidence of acute intracranial abnormality. 2. 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 Electronically Signed   By: Sebastian Ache M.D.   On: 07/28/2020 13:26   DG Chest Portable 1 View  Result Date: 07/28/2020 CLINICAL DATA:  Altered mental status. EXAM: PORTABLE CHEST 1 VIEW COMPARISON:  None. FINDINGS: Cardiomediastinal silhouette is normal. Mediastinal contours appear intact. There is no evidence of focal airspace consolidation, pleural effusion or pneumothorax. Osseous structures are without acute abnormality. Soft tissues are grossly normal. IMPRESSION: No active disease. Electronically Signed   By: Ted Mcalpine M.D.   On: 07/28/2020 12:03    Melo Stauber S Tanganika Barradas   [facility] If 7PM-7AM, please contact night-coverage at [email or web address], Office  [phone]   07/29/2020, 1:54 PM  LOS: 1 day

## 2020-07-29 NOTE — Progress Notes (Signed)
Pharmacy Antibiotic Note  Stephanie Acevedo is a 74 y.o. female admitted on 07/28/2020 with AMS.  Pharmacy has been consulted for zosyn dosing for diverticulitis. This was initially started then held by CCM, planning to restart today per IM.  Plan: Zosyn 3.375mg  IV Q8H (4 hr inf) F/u renal fxn, C&S, clinical status   Height: 5\' 6"  (167.6 cm) Weight: 45.8 kg (101 lb) IBW/kg (Calculated) : 59.3  Temp (24hrs), Avg:98.2 F (36.8 C), Min:97.7 F (36.5 C), Max:98.7 F (37.1 C)  Recent Labs  Lab 07/28/20 0929 07/28/20 1135 07/28/20 1432 07/28/20 1654 07/29/20 0059 07/29/20 0102 07/29/20 0836  WBC 11.0*  --   --   --   --  11.5*  --   CREATININE 0.92  --   --  0.89 1.09*  --  1.12*  LATICACIDVEN  --  2.6* 3.3*  --   --   --   --     Estimated Creatinine Clearance: 32.3 mL/min (A) (by C-G formula based on SCr of 1.12 mg/dL (H)).    No Known Allergies  Antimicrobials this admission: Zosyn 11/14>>  Dose adjustments this admission: N/A  Microbiology results: Pending  Thank you for allowing pharmacy to be a part of this patient's care.  07/31/20, PharmD, BCPS Clinical Pharmacist [phone] Please check AMION for all [facility] Pharmacy numbers 07/29/2020

## 2020-07-29 NOTE — Progress Notes (Signed)
eLink Physician-Brief Progress Note Patient Name: Stephanie Acevedo DOB: [birth year] MRN: [id number]   Date of Service  07/29/2020  HPI/Events of Note  Multiple issues: 1. K+ = 2.6 and Creatinine = 1.09 and 2. Hypotension - BP = 81/52 with MAP = 62.   eICU Interventions  Plan: 1. Replace K+. 2. Bolus with 0.9 NaCl 1 liter IV over 1 hour now.      Intervention Category Major Interventions: Hypotension - evaluation and management;Electrolyte abnormality - evaluation and management  Igor Bishop Eugene 07/29/2020, 2:58 AM

## 2020-07-29 NOTE — Progress Notes (Signed)
*  PRELIMINARY RESULTS* Echocardiogram 2D Echocardiogram has been performed.  Jeryl Columbia 07/29/2020, 2:59 PM

## 2020-07-29 NOTE — Progress Notes (Signed)
CRITICAL VALUE ALERT  Critical Value:  Potassium 2.6  Date & Time Notified:  07/29/20 at 0211  Provider Notified: E-Link  Orders Received/Actions taken: see orders

## 2020-07-30 DIAGNOSIS — I4729 Other ventricular tachycardia: Secondary | ICD-10-CM

## 2020-07-30 DIAGNOSIS — I472 Ventricular tachycardia: Secondary | ICD-10-CM

## 2020-07-30 DIAGNOSIS — E782 Mixed hyperlipidemia: Secondary | ICD-10-CM

## 2020-07-30 LAB — POTASSIUM: Potassium: 3.8 mmol/L (ref 3.5–5.1)

## 2020-07-30 LAB — BASIC METABOLIC PANEL
Anion gap: 11 (ref 5–15)
Anion gap: 6 (ref 5–15)
Anion gap: 12 (ref 5–15)
BUN: 5 mg/dL — ABNORMAL LOW (ref 8–23)
BUN: <5 mg/dL — ABNORMAL LOW (ref 8–23)
BUN: <5 mg/dL — ABNORMAL LOW (ref 8–23)
CO2: 24 mmol/L (ref 22–32)
CO2: 27 mmol/L (ref 22–32)
CO2: 25 mmol/L (ref 22–32)
Calcium: 8 mg/dL — ABNORMAL LOW (ref 8.9–10.3)
Calcium: 7.9 mg/dL — ABNORMAL LOW (ref 8.9–10.3)
Calcium: 7.8 mg/dL — ABNORMAL LOW (ref 8.9–10.3)
Chloride: 106 mmol/L (ref 98–111)
Chloride: 110 mmol/L (ref 98–111)
Chloride: 107 mmol/L (ref 98–111)
Creatinine, Ser: 0.89 mg/dL (ref 0.44–1.00)
Creatinine, Ser: 0.82 mg/dL (ref 0.44–1.00)
Creatinine, Ser: 0.86 mg/dL (ref 0.44–1.00)
GFR, Estimated: >60 mL/min (ref >60)
GFR, Estimated: >60 mL/min (ref >60)
GFR, Estimated: >60 mL/min (ref >60)
Glucose, Bld: 167 mg/dL — ABNORMAL HIGH (ref 70–99)
Glucose, Bld: 161 mg/dL — ABNORMAL HIGH (ref 70–99)
Glucose, Bld: 159 mg/dL — ABNORMAL HIGH (ref 70–99)
Potassium: 3.3 mmol/L — ABNORMAL LOW (ref 3.5–5.1)
Potassium: 3.2 mmol/L — ABNORMAL LOW (ref 3.5–5.1)
Potassium: 3.1 mmol/L — ABNORMAL LOW (ref 3.5–5.1)
Sodium: 141 mmol/L (ref 135–145)
Sodium: 143 mmol/L (ref 135–145)
Sodium: 144 mmol/L (ref 135–145)

## 2020-07-30 LAB — GLUCOSE, CAPILLARY: Glucose-Capillary: 144 mg/dL — ABNORMAL HIGH (ref 70–99)

## 2020-07-30 MED ORDER — TRAMADOL HCL 50 MG PO TABS
50.0000 mg | ORAL_TABLET | Freq: Four times a day (QID) | ORAL | Status: DC | PRN
  Administered 2020-07-30: 50 mg via ORAL
  Filled 2020-07-30: qty 1

## 2020-07-30 MED ORDER — LOPERAMIDE HCL 1 MG/7.5ML PO SUSP
2.0000 mg | ORAL | Status: DC | PRN
  Administered 2020-07-30 – 2020-08-02 (×4): 2 mg via ORAL
  Filled 2020-07-30 (×7): qty 15

## 2020-07-30 MED ORDER — POTASSIUM CHLORIDE 10 MEQ/100ML IV SOLN
10.0000 meq | INTRAVENOUS | Status: AC
  Administered 2020-07-30 (×4): 10 meq via INTRAVENOUS
  Filled 2020-07-30 (×4): qty 100

## 2020-07-30 NOTE — Progress Notes (Signed)
[facility]  PROGRESS NOTE  Stephanie Acevedo RAQ:[id number] DOB: 1946/09/02 DOA: 07/28/2020 PCP: Kermit Balo, DO   Brief HPI:   74 year old female with medical history of dementia, hyperlipidemia was brought to ED after she had nausea vomiting which started on Friday.  In the ED on EKG she was noted to have V. tach consistent with torsade de pointes.  Patient received potassium and magnesium in the ED.  She was found to have profound hypokalemia with potassium less than 2 on initial blood work.  Magnesium was 2.3.  She was also started on IV amiodarone.  Subjective   Patient seen and examined, no further episodes of NSVT.  Potassium still low this morning.  Magnesium was 2.2 yesterday.  Patient still having loose bowel movements.   Assessment/Plan:    1. Ventricular tachycardia-likely from electrolyte abnormalities, patient had severe hypokalemia, potassium is being replaced.  This morning potassium was 2.9.  Magnesium was 2.3.  Patient was initially started on amiodarone infusion which has been discontinued per cardiology.  Patient started on low-dose Toprol-XL.  Echocardiogram has been ordered.  We will follow results, further ischemic evaluation based on echo findings.  Goal to keep potassium level more than 4 and magnesium level more than 2. 2. Diarrhea-likely associated with sigmoid diverticulitis, patient is on IV Zosyn.  Will obtain stool for C. difficile PCR, GI pathogen panel.  Continue contact precautions. 3. Hypokalemia-potassium is 3.1 this morning, will start KCl 10 mEq IV x4 doses.   Check BMP in am. 4. Sigmoid diverticulitis-seen on CT abdomen/pelvis, will start Zosyn per pharmacy consultation.  Once patient is stable she can be  discharged on Augmentin p.o. twice a day for 5 more days. 5. Dementia-no behavior disturbance, will monitor. 6. Hyperlipidemia-continue rosuvastatin 5 mg daily.   COVID-19 Labs  No results for input(s): DDIMER, FERRITIN, LDH, CRP in the last 72  hours.  Lab Results  Component Value Date   SARSCOV2NAA NEGATIVE 07/28/2020   SARSCOV2NAA Detected (A) 09/25/2019     Scheduled medications:   . Chlorhexidine Gluconate Cloth  6 each Topical Daily  . enoxaparin (LOVENOX) injection  30 mg Subcutaneous Q24H  . metoprolol succinate  12.5 mg Oral Daily  . rosuvastatin  5 mg Oral QHS     CBG: Recent Labs  Lab 07/28/20 0921 07/30/20 0624  GLUCAP 192* 144*    SpO2: 95 % O2 Flow Rate (L/min): 4 L/min    CBC: Recent Labs  Lab 07/28/20 0929 07/29/20 0102  WBC 11.0* 11.5*  HGB 11.7* 9.6*  HCT 34.5* 28.8*  MCV 88.5 88.3  PLT 262 196    Basic Metabolic Panel: Recent Labs  Lab 07/28/20 1135 07/28/20 1654 07/29/20 0059 07/29/20 0836 07/29/20 1517 07/30/20 0048 07/30/20 0428 07/30/20 0632  NA  --    < > 141 141  --  141 143 144  K  --    < > 2.6* 2.9*  --  3.3* 3.2* 3.1*  CL  --    < > 106 107  --  106 110 107  CO2  --    < > 29 23  --  24 27 25   GLUCOSE  --    < > 169* 194*  --  167* 161* 159*  BUN  --    < > 8 6*  --  5* <5* <5*  CREATININE  --    < > 1.09* 1.12*  --  0.89 0.82 0.86  CALCIUM  --    < >  8.1* 7.9*  --  8.0* 7.9* 7.8*  MG 2.2  --  2.6*  --  2.2  --   --   --   PHOS  --   --  3.1  --   --   --   --   --    < > = values in this interval not displayed.     Liver Function Tests: Recent Labs  Lab 07/28/20 0929  AST 25  ALT 23  ALKPHOS 51  BILITOT 0.6  PROT 6.9  ALBUMIN 3.6     Antibiotics: Anti-infectives (From admission, onward)   Start     Dose/Rate Route Frequency Ordered Stop   07/29/20 1300  piperacillin-tazobactam (ZOSYN) IVPB 3.375 g        3.375 g 12.5 mL/hr over 240 Minutes Intravenous Every 8 hours 07/29/20 1045     07/28/20 2100  piperacillin-tazobactam (ZOSYN) IVPB 3.375 g  Status:  Discontinued        3.375 g 12.5 mL/hr over 240 Minutes Intravenous Every 8 hours 07/28/20 1344 07/28/20 1538   07/28/20 1345  piperacillin-tazobactam (ZOSYN) IVPB 3.375 g        3.375 g 100  mL/hr over 30 Minutes Intravenous  Once 07/28/20 1344 07/28/20 1515       DVT prophylaxis: Lovenox  Code Status: Full code  Family Communication: No family at bedside   Consultants:  Cardiology  PCCM  Procedures:      Objective   Vitals:   07/30/20 0900 07/30/20 1000 07/30/20 1100 07/30/20 1200  BP: (!) 91/58 101/71 (!) 131/57 (!) 129/98  Pulse: (!) 58 68 66 74  Resp: 16 19 18  (!) 23  Temp:      TempSrc:      SpO2: 100% 99% 100% 95%  Weight:      Height:        Intake/Output Summary (Last 24 hours) at 07/30/2020 1341 Last data filed at 07/30/2020 1230 Gross per 24 hour  Intake 1164.18 ml  Output 1716 ml  Net -551.82 ml    11/14 1901 - 11/16 0700 In: 3478.1 [P.O.:780; I.V.:477.6] Out: 2126 [Urine:2125]  Filed Weights   07/28/20 0908  Weight: 45.8 kg    Physical Examination:    General-appears in no acute distress  Heart-S1-S2, regular, no murmur auscultated  Lungs-clear to auscultation bilaterally, no wheezing or crackles auscultated  Abdomen-soft, nontender, no organomegaly  Extremities-no edema in the lower extremities  Neuro-alert, oriented x3, no focal deficit noted   Status is: Inpatient  Dispo: The patient is from: Home              Anticipated d/c is to: Home              Anticipated d/c date is: 07/30/2020              Patient currently not medically stable for discharge  Barrier to discharge-ongoing management for diarrhea, sigmoid diverticulitis, hypokalemia     Data Reviewed:   Recent Results (from the past 240 hour(s))  Respiratory Panel by RT PCR (Flu A&B, Covid) - Nasopharyngeal Swab     Status: None   Collection Time: 07/28/20 11:38 AM   Specimen: Nasopharyngeal Swab  Result Value Ref Range Status   SARS Coronavirus 2 by RT PCR NEGATIVE NEGATIVE Final    Comment: (NOTE) SARS-CoV-2 target nucleic acids are NOT DETECTED.  The SARS-CoV-2 RNA is generally detectable in upper respiratoy specimens during the acute  phase of infection. The lowest concentration of SARS-CoV-2  viral copies this assay can detect is 131 copies/mL. A negative result does not preclude SARS-Cov-2 infection and should not be used as the sole basis for treatment or other patient management decisions. A negative result may occur with  improper specimen collection/handling, submission of specimen other than nasopharyngeal swab, presence of viral mutation(s) within the areas targeted by this assay, and inadequate number of viral copies (<131 copies/mL). A negative result must be combined with clinical observations, patient history, and epidemiological information. The expected result is Negative.  Fact Sheet for Patients:  https://www.moore.com/  Fact Sheet for Healthcare Providers:  https://www.young.biz/  This test is no t yet approved or cleared by the [place] FDA and  has been authorized for detection and/or diagnosis of SARS-CoV-2 by FDA under an Emergency Use Authorization (EUA). This EUA will remain  in effect (meaning this test can be used) for the duration of the COVID-19 declaration under Section 564(b)(1) of the Act, 21 U.S.C. section 360bbb-3(b)(1), unless the authorization is terminated or revoked sooner.     Influenza A by PCR NEGATIVE NEGATIVE Final   Influenza B by PCR NEGATIVE NEGATIVE Final    Comment: (NOTE) The Xpert Xpress SARS-CoV-2/FLU/RSV assay is intended as an aid in  the diagnosis of influenza from Nasopharyngeal swab specimens and  should not be used as a sole basis for treatment. Nasal washings and  aspirates are unacceptable for Xpert Xpress SARS-CoV-2/FLU/RSV  testing.  Fact Sheet for Patients: https://www.moore.com/  Fact Sheet for Healthcare Providers: https://www.young.biz/  This test is not yet approved or cleared by the [place] FDA and  has been authorized for detection and/or diagnosis of  SARS-CoV-2 by  FDA under an Emergency Use Authorization (EUA). This EUA will remain  in effect (meaning this test can be used) for the duration of the  Covid-19 declaration under Section 564(b)(1) of the Act, 21  U.S.C. section 360bbb-3(b)(1), unless the authorization is  terminated or revoked. Performed at [facility], [address] [place], [place] [address]   Urine culture     Status: None   Collection Time: 07/28/20 12:36 PM   Specimen: Urine, Random  Result Value Ref Range Status   Specimen Description URINE, RANDOM  Final   Special Requests NONE  Final   Culture   Final    NO GROWTH Performed at [facility], [address], [place], [place] [address]    Report Status 07/29/2020 FINAL  Final  Blood culture (routine x 2)     Status: None (Preliminary result)   Collection Time: 07/28/20  1:37 PM   Specimen: BLOOD  Result Value Ref Range Status   Specimen Description BLOOD RIGHT ANTECUBITAL  Final   Special Requests   Final    AEROBIC BOTTLE ONLY Blood Culture results may not be optimal due to an inadequate volume of blood received in culture bottles   Culture   Final    NO GROWTH < 24 HOURS Performed at [facility], [address], [place], [place] [address]    Report Status PENDING  Incomplete  Blood culture (routine x 2)     Status: None (Preliminary result)   Collection Time: 07/28/20  2:32 PM   Specimen: BLOOD  Result Value Ref Range Status   Specimen Description BLOOD SITE NOT SPECIFIED  Final   Special Requests   Final    BOTTLES DRAWN AEROBIC ONLY Blood Culture results may not be optimal due to an inadequate volume of blood received  in culture bottles   Culture   Final    NO GROWTH < 24 HOURS Performed at [facility], [address] [place], [place] [address]    Report Status PENDING  Incomplete  MRSA PCR Screening     Status: None   Collection Time: 07/28/20  6:25 PM   Specimen: Nasopharyngeal  Result Value Ref Range Status    MRSA by PCR NEGATIVE NEGATIVE Final    Comment:        The GeneXpert MRSA Assay (FDA approved for NASAL specimens only), is one component of a comprehensive MRSA colonization surveillance program. It is not intended to diagnose MRSA infection nor to guide or monitor treatment for MRSA infections. Performed at [facility], [address] [place], [place] [address]     No results for input(s): LIPASE, AMYLASE in the last 168 hours. No results for input(s): AMMONIA in the last 168 hours.  Cardiac Enzymes: No results for input(s): CKTOTAL, CKMB, CKMBINDEX, TROPONINI in the last 168 hours. BNP (last 3 results) No results for input(s): BNP in the last 8760 hours.  ProBNP (last 3 results) No results for input(s): PROBNP in the last 8760 hours.  Studies:  ECHOCARDIOGRAM COMPLETE  Result Date: 07/29/2020    ECHOCARDIOGRAM REPORT   [facility] Date of Exam: 07/29/2020 Medical Rec #:  [id number]    Height:       66.0 in Accession #:    [id number]   Weight:       101.0 lb Date of Birth:  02-04-1946    BSA:          1.496 m Patient Age:    74 years     BP:           141/77 mmHg Patient Gender: F            HR:           78 bpm. [facility] Procedure: 2D Echo Indications:    Ventricular Tachycardia I47.2  History:        Patient has no prior history of Echocardiogram examinations.                 Risk Factors:Non-Smoker, Diabetes and Dyslipidemia. Torsades de                 pointes , Dementia without behavioral disturbance.  Sonographer:    Jeryl ColumbiaJohanna Elliott Referring Phys: [id number] SUNIT TOLIA IMPRESSIONS  1. Left ventricular ejection fraction, by estimation, is 50 to 55%. The left ventricle has low normal function. The left ventricle has no regional wall motion abnormalities. Left ventricular diastolic parameters are consistent with Grade II diastolic dysfunction (pseudonormalization).  2. Right ventricular systolic function is normal. The right ventricular size  is normal.  3. The mitral valve is normal in structure. Moderate mitral valve regurgitation. No evidence of mitral stenosis.  4. The aortic valve is normal in structure. Aortic valve regurgitation is not visualized. No aortic stenosis is present.  5. The inferior vena cava is normal in size with greater than 50% respiratory variability, suggesting right atrial pressure of 3 mmHg. FINDINGS  Left Ventricle: Left ventricular ejection fraction, by estimation, is 50 to 55%. The left ventricle has low normal function. The left ventricle has no regional wall motion abnormalities. The left ventricular internal cavity size was normal in size. There is no left ventricular hypertrophy. Left ventricular diastolic parameters are consistent with Grade II diastolic dysfunction (  pseudonormalization). Right Ventricle: The right ventricular size is normal. No increase in right ventricular wall thickness. Right ventricular systolic function is normal. Left Atrium: Left atrial size was normal in size. Right Atrium: Right atrial size was normal in size. Pericardium: There is no evidence of pericardial effusion. Mitral Valve: The mitral valve is normal in structure. Moderate mitral valve regurgitation. No evidence of mitral valve stenosis. Tricuspid Valve: The tricuspid valve is normal in structure. Tricuspid valve regurgitation is mild . No evidence of tricuspid stenosis. Aortic Valve: The aortic valve is normal in structure. Aortic valve regurgitation is not visualized. No aortic stenosis is present. Pulmonic Valve: The pulmonic valve was normal in structure. Pulmonic valve regurgitation is not visualized. No evidence of pulmonic stenosis. Aorta: The aortic root is normal in size and structure. Venous: The inferior vena cava is normal in size with greater than 50% respiratory variability, suggesting right atrial pressure of 3 mmHg. IAS/Shunts: No atrial level shunt detected by color flow Doppler.  LEFT VENTRICLE PLAX 2D LVIDd:          4.50 cm  Diastology LVIDs:         3.30 cm  LV e' medial:    5.00 cm/s LV PW:         1.00 cm  LV E/e' medial:  21.0 LV IVS:        0.90 cm  LV e' lateral:   10.80 cm/s LVOT diam:     1.50 cm  LV E/e' lateral: 9.7 LVOT Area:     1.77 cm  RIGHT VENTRICLE RV S prime:     12.70 cm/s TAPSE (M-mode): 2.0 cm LEFT ATRIUM             Index       RIGHT ATRIUM           Index LA diam:        2.50 cm 1.67 cm/m  RA Area:     10.20 cm LA Vol (A2C):   28.8 ml 19.25 ml/m RA Volume:   20.90 ml  13.97 ml/m LA Vol (A4C):   22.8 ml 15.24 ml/m LA Biplane Vol: 26.9 ml 17.98 ml/m   AORTA Ao Root diam: 2.50 cm MITRAL VALVE                TRICUSPID VALVE MV Area (PHT): 3.37 cm     TR Peak grad:   18.3 mmHg MV Decel Time: 225 msec     TR Vmax:        214.00 cm/s MR Peak grad: 106.1 mmHg MR Mean grad: 73.0 mmHg     SHUNTS MR Vmax:      515.00 cm/s   Systemic Diam: 1.50 cm MR Vmean:     406.0 cm/s MV E velocity: 105.00 cm/s MV A velocity: 69.00 cm/s MV E/A ratio:  1.52 Donato Schultz MD Electronically signed by Donato Schultz MD Signature Date/Time: 07/29/2020/3:11:14 PM    Final     Meredeth Ide   [facility] If 7PM-7AM, please contact night-coverage at [email or web address], Office  [phone]   07/30/2020, 1:41 PM  LOS: 2 days

## 2020-07-30 NOTE — Progress Notes (Signed)
Progress Note  Patient Name: Stephanie Acevedo Date of Encounter: 07/30/2020  Attending physician: Meredeth Ide, MD Primary care provider: Kermit Balo, DO Primary Cardiologist:  Consultant:Dagny Fiorentino Cristela Blue, PA-C  Subjective: Rosabell Geyer is a 74 y.o. female who was seen and examined at bedside at approximately 07:30 AM No events overnight.  Review of telemetry reveals no recurrence of NSVT. Patient maintaining sinus rhythm.  Patent denies chest pain or shortness of breath at rest or with effort related activities. Denies paroxysmal nocturnal dyspnea, orthopnea, leg swelling.  Patient remains hypokalemic with potassium 3.2 this morning. Magnesium level 2.2 this morning.  Patient is awake and alert, eating breakfast this morning. No family present at bedside.  Case discussed and reviewed with her nurse.  Objective: Vital Signs in the last 24 hours: Temp:  [98.1 F (36.7 C)-98.5 F (36.9 C)] 98.1 F (36.7 C) (11/16 0700) Pulse Rate:  [56-83] 83 (11/16 0800) Resp:  [16-32] 22 (11/16 0800) BP: (101-158)/(20-92) 101/72 (11/16 0800) SpO2:  [93 %-100 %] 96 % (11/16 0800)  Intake/Output:  Intake/Output Summary (Last 24 hours) at 07/30/2020 1036 Last data filed at 07/30/2020 0842 Gross per 24 hour  Intake 996.08 ml  Output 1711 ml  Net -714.92 ml    Net IO Since Admission: 4,416.82 mL [07/30/20 1036]  Weights:  Filed Weights   07/28/20 0908  Weight: 45.8 kg    Telemetry: Personally reviewed.  Sinus rhythm. No recurrence of NSVT.   Physical examination: PHYSICAL EXAM: Vitals with BMI 07/30/2020 07/30/2020 07/30/2020  Height - - -  Weight - - -  BMI - - -  Systolic 101 120 088  Diastolic 72 62 59  Pulse 83 72 56    CONSTITUTIONAL: Appears older than stated age, frail, hemodynamically stable, no acute distress. SKIN: Skin is warm and dry. No rash noted. No cyanosis. No pallor. No jaundice HEAD: Normocephalic and atraumatic.  EYES: No scleral icterus MOUTH/THROAT:  Moist oral membranes.  NECK: No JVD present. No thyromegaly noted.  Soft right carotid bruit LYMPHATIC: No visible cervical adenopathy.  CHEST Normal respiratory effort. No intercostal retractions  LUNGS: Clear to auscultation bilaterally. No stridor. No wheezes. No rales.  CARDIOVASCULAR: Regular rate and rhythm with occasional extrasystoles, positive S1-S2, soft holosystolic murmur heard at the apex, no gallops or rubs. ABDOMINAL: Cachectic, nonobese, soft, mildly distended, nontender, positive bowel sounds, no apparent ascites.  EXTREMITIES: No peripheral edema  HEMATOLOGIC: No significant bruising NEUROLOGIC: Oriented to person, place, and time. Nonfocal. Normal muscle tone.  PSYCHIATRIC: Normal mood and affect. Normal behavior. Cooperative  Lab Results: Hematology Recent Labs  Lab 07/28/20 0929 07/29/20 0102  WBC 11.0* 11.5*  RBC 3.90 3.26*  HGB 11.7* 9.6*  HCT 34.5* 28.8*  MCV 88.5 88.3  MCH 30.0 29.4  MCHC 33.9 33.3  RDW 13.2 13.5  PLT 262 196    Chemistry Recent Labs  Lab 07/28/20 0929 07/28/20 1654 07/30/20 0048 07/30/20 0428 07/30/20 0632  NA 140   < > 141 143 144  K <2.0*   < > 3.3* 3.2* 3.1*  CL 98   < > 106 110 107  CO2 28   < > 24 27 25   GLUCOSE 198*   < > 167* 161* 159*  BUN 8   < > 5* <5* <5*  CREATININE 0.92   < > 0.89 0.82 0.86  CALCIUM 9.1   < > 8.0* 7.9* 7.8*  PROT 6.9  --   --   --   --  ALBUMIN 3.6  --   --   --   --   AST 25  --   --   --   --   ALT 23  --   --   --   --   ALKPHOS 51  --   --   --   --   BILITOT 0.6  --   --   --   --   GFRNONAA >60   < > >60 >60 >60  ANIONGAP 14   < > 11 6 12    < > = values in this interval not displayed.     Cardiac Enzymes: Cardiac Panel (last 3 results) Recent Labs    07/28/20 1135 07/28/20 1432  TROPONINIHS 14 17    BNP (last 3 results) No results for input(s): BNP in the last 8760 hours.  ProBNP (last 3 results) No results for input(s): PROBNP in the last 8760 hours.   DDimer No  results for input(s): DDIMER in the last 168 hours.   Hemoglobin A1c:  Lab Results  Component Value Date   HGBA1C 6.5 (H) 03/25/2020   MPG 140 03/25/2020    TSH  Recent Labs    11/27/19 1540  TSH 1.17    Lipid Panel     Component Value Date/Time   CHOL 233 (H) 03/25/2020 0846   TRIG 202 (H) 03/25/2020 0846   HDL 64 03/25/2020 0846   CHOLHDL 3.6 03/25/2020 0846   LDLCALC 135 (H) 03/25/2020 0846    Imaging: CT ABDOMEN PELVIS WO CONTRAST  Result Date: 07/28/2020 CLINICAL DATA:  Abdominal distension.  Dry heaves.  Disorientation. EXAM: CT ABDOMEN AND PELVIS WITHOUT CONTRAST TECHNIQUE: Multidetector CT imaging of the abdomen and pelvis was performed following the standard protocol without IV contrast. COMPARISON:  None. FINDINGS: Despite efforts by the technologist and patient, motion artifact is present on today's exam and could not be eliminated. This reduces exam sensitivity and specificity. Patient arm positioning by her sides also reduces signal to noise ratio. Lower chest: Descending thoracic aortic atherosclerosis. Hepatobiliary: Unremarkable Pancreas: Unremarkable Spleen: Unremarkable Adrenals/Urinary Tract: Foley catheter in the urinary bladder. No hydronephrosis, hydroureter, or definite urinary tract calculi. Adrenal glands unremarkable. Stomach/Bowel: Sigmoid colon diverticulosis with some minimal hazy stranding adjacent to the sigmoid colon on image 71 of series 5 which could reflect early low-grade acute diverticulitis. No extraluminal gas or abscess. No dilated bowel. Vascular/Lymphatic: Unremarkable Reproductive: Unremarkable Other: No supplemental non-categorized findings. Musculoskeletal: Unremarkable IMPRESSION: 1. Sigmoid colon diverticulosis with some minimal hazy stranding adjacent to the sigmoid colon which could reflect early low-grade acute diverticulitis. No extraluminal gas or abscess. 2. Aortic atherosclerosis. Aortic Atherosclerosis (ICD10-I70.0). Electronically  Signed   By: Gaylyn RongWalter  Liebkemann M.D.   On: 07/28/2020 13:28   CT Head Wo Contrast  Result Date: 07/28/2020 CLINICAL DATA:  Mental status change. EXAM: CT HEAD WITHOUT CONTRAST TECHNIQUE: Contiguous axial images were obtained from the base of the skull through the vertex without intravenous contrast. COMPARISON:  01/03/2020 FINDINGS: Brain: There is no evidence of an acute infarct, intracranial hemorrhage, mass, midline shift, or extra-axial fluid collection. Cerebral atrophy is unchanged with preferential involvement of the mesial temporal lobes. Hypodensities in the cerebral white matter bilaterally are unchanged and nonspecific but compatible with mild chronic small vessel ischemic disease. Vascular: Calcified atherosclerosis at the skull base. No hyperdense vessel. Skull: No fracture suspicious osseous lesion. Sinuses/Orbits: Visualized paranasal sinuses and mastoid air cells are clear. Unremarkable orbits. Other: None. IMPRESSION: 1. No evidence of acute  intracranial abnormality. 2. 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 Electronically Signed   By: Sebastian Ache M.D.   On: 07/28/2020 13:26   DG Chest Portable 1 View  Result Date: 07/28/2020 CLINICAL DATA:  Altered mental status. EXAM: PORTABLE CHEST 1 VIEW COMPARISON:  None. FINDINGS: Cardiomediastinal silhouette is normal. Mediastinal contours appear intact. There is no evidence of focal airspace consolidation, pleural effusion or pneumothorax. Osseous structures are without acute abnormality. Soft tissues are grossly normal. IMPRESSION: No active disease. Electronically Signed   By: Ted Mcalpine M.D.   On: 07/28/2020 12:03   ECHOCARDIOGRAM COMPLETE  Result Date: 07/29/2020    ECHOCARDIOGRAM REPORT   Patient Name:   [facility] Date of Exam: 07/29/2020 Medical Rec #:  [id number]    Height:       66.0 in Accession #:    [id number]   Weight:       101.0 lb Date of Birth:  1946/08/15    BSA:          1.496 m Patient  Age:    74 years     BP:           141/77 mmHg Patient Gender: F            HR:           78 bpm. [facility] Procedure: 2D Echo Indications:    Ventricular Tachycardia I47.2  History:        Patient has no prior history of Echocardiogram examinations.                 Risk Factors:Non-Smoker, Diabetes and Dyslipidemia. Torsades de                 pointes , Dementia without behavioral disturbance.  Sonographer:    Jeryl Columbia Referring Phys: [id number] SUNIT TOLIA IMPRESSIONS  1. Left ventricular ejection fraction, by estimation, is 50 to 55%. The left ventricle has low normal function. The left ventricle has no regional wall motion abnormalities. Left ventricular diastolic parameters are consistent with Grade II diastolic dysfunction (pseudonormalization).  2. Right ventricular systolic function is normal. The right ventricular size is normal.  3. The mitral valve is normal in structure. Moderate mitral valve regurgitation. No evidence of mitral stenosis.  4. The aortic valve is normal in structure. Aortic valve regurgitation is not visualized. No aortic stenosis is present.  5. The inferior vena cava is normal in size with greater than 50% respiratory variability, suggesting right atrial pressure of 3 mmHg. FINDINGS  Left Ventricle: Left ventricular ejection fraction, by estimation, is 50 to 55%. The left ventricle has low normal function. The left ventricle has no regional wall motion abnormalities. The left ventricular internal cavity size was normal in size. There is no left ventricular hypertrophy. Left ventricular diastolic parameters are consistent with Grade II diastolic dysfunction (pseudonormalization). Right Ventricle: The right ventricular size is normal. No increase in right ventricular wall thickness. Right ventricular systolic function is normal. Left Atrium: Left atrial size was normal in size. Right Atrium: Right atrial size was normal in size. Pericardium: There is no evidence of  pericardial effusion. Mitral Valve: The mitral valve is normal in structure. Moderate mitral valve regurgitation. No evidence of mitral valve stenosis. Tricuspid Valve: The tricuspid valve is normal in structure. Tricuspid valve regurgitation is mild . No evidence of tricuspid stenosis. Aortic Valve: The aortic valve is normal in structure. Aortic valve regurgitation is not visualized. No aortic stenosis  is present. Pulmonic Valve: The pulmonic valve was normal in structure. Pulmonic valve regurgitation is not visualized. No evidence of pulmonic stenosis. Aorta: The aortic root is normal in size and structure. Venous: The inferior vena cava is normal in size with greater than 50% respiratory variability, suggesting right atrial pressure of 3 mmHg. IAS/Shunts: No atrial level shunt detected by color flow Doppler.  LEFT VENTRICLE PLAX 2D LVIDd:         4.50 cm  Diastology LVIDs:         3.30 cm  LV e' medial:    5.00 cm/s LV PW:         1.00 cm  LV E/e' medial:  21.0 LV IVS:        0.90 cm  LV e' lateral:   10.80 cm/s LVOT diam:     1.50 cm  LV E/e' lateral: 9.7 LVOT Area:     1.77 cm  RIGHT VENTRICLE RV S prime:     12.70 cm/s TAPSE (M-mode): 2.0 cm LEFT ATRIUM             Index       RIGHT ATRIUM           Index LA diam:        2.50 cm 1.67 cm/m  RA Area:     10.20 cm LA Vol (A2C):   28.8 ml 19.25 ml/m RA Volume:   20.90 ml  13.97 ml/m LA Vol (A4C):   22.8 ml 15.24 ml/m LA Biplane Vol: 26.9 ml 17.98 ml/m   AORTA Ao Root diam: 2.50 cm MITRAL VALVE                TRICUSPID VALVE MV Area (PHT): 3.37 cm     TR Peak grad:   18.3 mmHg MV Decel Time: 225 msec     TR Vmax:        214.00 cm/s MR Peak grad: 106.1 mmHg MR Mean grad: 73.0 mmHg     SHUNTS MR Vmax:      515.00 cm/s   Systemic Diam: 1.50 cm MR Vmean:     406.0 cm/s MV E velocity: 105.00 cm/s MV A velocity: 69.00 cm/s MV E/A ratio:  1.52 Donato Schultz MD Electronically signed by Donato Schultz MD Signature Date/Time: 07/29/2020/3:11:14 PM    Final     Cardiac  database: EKG: 07/28/2020 9:02 AM: Sinus bradycardia, 57 bpm, normal axis, incomplete right bundle branch block, prolonged QT, possible U wave.  07/28/2020 11:42 AM: Wide-complex tachycardia, 210 bpm, suggestive of torsades de pointe.  Echocardiogram: 07/29/2020:  1. Left ventricular ejection fraction, by estimation, is 50 to 55%. The left ventricle has low normal function. The left ventricle has no regional wall motion abnormalities. Left ventricular diastolic parameters are consistent with Grade II diastolic dysfunction (pseudonormalization).  2. Right ventricular systolic function is normal. The right ventricular size is normal.  3. The mitral valve is normal in structure. Moderate mitral valve regurgitation. No evidence of mitral stenosis.  4. The aortic valve is normal in structure. Aortic valve regurgitation is not visualized. No aortic stenosis is present.  5. The inferior vena cava is normal in size with greater than 50% respiratory variability, suggesting right atrial pressure of 3 mmHg.   Scheduled Meds: . Chlorhexidine Gluconate Cloth  6 each Topical Daily  . enoxaparin (LOVENOX) injection  30 mg Subcutaneous Q24H  . metoprolol succinate  12.5 mg Oral Daily  . rosuvastatin  5 mg Oral QHS  Continuous Infusions: . lactated ringers Stopped (07/29/20 0913)  . piperacillin-tazobactam (ZOSYN)  IV 12.5 mL/hr at 07/30/20 0700  . potassium chloride 10 mEq (07/30/20 0956)    PRN Meds: acetaminophen, loperamide HCl, traMADol   IMPRESSION & RECOMMENDATIONS: Sera Hitsman is a 74 y.o. female whose past medical history and cardiac risk factors include: Hyperlipidemia, dementia, advanced age, postmenopausal female.  Ventricular tachycardia: Patient presents having episodes of nonsustained ventricular tachycardia and torsades de pointe most likely secondary to electrolyte disturbances, specifically hypokalemia. With electrolyte replacement as well as Toprol XL  patient is now  maintaining sinus rhythm with occasional ectopic ventricular beats. Discontinued amiodarone yesterday, despite this patient has had no episodes of nonsustained ventricular tachycardia.  She continues to deny active chest pain or dyspnea. Echocardiogram revealed low normal left ventricular ejection fraction on 50-55% and Grade II diastolic dysfunction, otherwise normal without significant valvular abnormalities. As patient's heart structure and function are relatively normal, patient is stable, and given her age, she could undergo further ischemic workup on an outpatient basis if she chooses, particularly in view of hypokalemia as reversible etiology of ventricular tachycardia. Will discuss with patient and her husband regarding goals of care and if they would prefer further ischemic work up inpatient or outpatient. From previous discussions patient prefers not to undergo cardiac catheterization or stress testing during this hospitalization.  Continue Toprol-XL  Patient remains hypokalemic, electrolyte replacements per primary team goal potassium of 4 and magnesium level of 2. Continue telemetry. Further ischemic workup likely as an outpatient, depending on patient preference.   Hypokalemia: Continues to get parenteral supplements.  Managed by primary team.  Anemia: Management per primary team.   Intractable nausea and vomiting on admission: No recurrence of vomiting and improvement of nausea. Currently managed by primary team.  Hyperlipidemia: Continue rosuvastatin at home dose of 5 mg daily.   Dementia: Patient awake in alert on exam, with some mild confusion. Will defer management of primary team.  Patient's questions and concerns were addressed to her satisfaction. She voices understanding of the instructions provided during this encounter.   This note was created using a voice recognition software as a result there may be grammatical errors inadvertently enclosed that do not reflect the nature  of this encounter. Every attempt is made to correct such errors.  Total time spent: 30 minutes.  Plan of care discussed with both the patient and her nurse.   Rayford Halsted, PA-C 07/30/2020, 12:13 PM Office: [phone]

## 2020-07-31 ENCOUNTER — Encounter (HOSPITAL_COMMUNITY): Payer: Self-pay | Admitting: Pulmonary Disease

## 2020-07-31 ENCOUNTER — Telehealth: Payer: Self-pay | Admitting: Cardiology

## 2020-07-31 DIAGNOSIS — R197 Diarrhea, unspecified: Secondary | ICD-10-CM

## 2020-07-31 DIAGNOSIS — E782 Mixed hyperlipidemia: Secondary | ICD-10-CM

## 2020-07-31 LAB — GLUCOSE, CAPILLARY
Glucose-Capillary: 114 mg/dL — ABNORMAL HIGH (ref 70–99)
Glucose-Capillary: 167 mg/dL — ABNORMAL HIGH (ref 70–99)
Glucose-Capillary: 149 mg/dL — ABNORMAL HIGH (ref 70–99)
Glucose-Capillary: 142 mg/dL — ABNORMAL HIGH (ref 70–99)

## 2020-07-31 LAB — BASIC METABOLIC PANEL
Anion gap: 7 (ref 5–15)
BUN: 6 mg/dL — ABNORMAL LOW (ref 8–23)
CO2: 26 mmol/L (ref 22–32)
Calcium: 8 mg/dL — ABNORMAL LOW (ref 8.9–10.3)
Chloride: 105 mmol/L (ref 98–111)
Creatinine, Ser: 0.87 mg/dL (ref 0.44–1.00)
GFR, Estimated: >60 mL/min (ref >60)
Glucose, Bld: 159 mg/dL — ABNORMAL HIGH (ref 70–99)
Potassium: 3.5 mmol/L (ref 3.5–5.1)
Sodium: 138 mmol/L (ref 135–145)

## 2020-07-31 LAB — C DIFFICILE QUICK SCREEN W PCR REFLEX
C Diff antigen: NEGATIVE
C Diff interpretation: NOT DETECTED
C Diff toxin: NEGATIVE

## 2020-07-31 LAB — GASTROINTESTINAL PANEL BY PCR, STOOL (REPLACES STOOL CULTURE)

## 2020-07-31 MED ORDER — SODIUM CHLORIDE 0.9 % IV SOLN: INTRAVENOUS | Status: AC

## 2020-07-31 MED ORDER — SODIUM CHLORIDE 0.9 % IV SOLN: INTRAVENOUS | Status: DC | PRN

## 2020-07-31 MED ORDER — SODIUM CHLORIDE 0.9 % IV SOLN
INTRAVENOUS | Status: DC | PRN
  Administered 2020-07-31: 250 mL via INTRAVENOUS

## 2020-07-31 NOTE — Telephone Encounter (Signed)
Patient needs a TOC per ST she has NP hospital f/u on 12/10 . Thank you

## 2020-07-31 NOTE — Progress Notes (Signed)
[facility]  PROGRESS NOTE  Stephanie Acevedo ZSW:[id number] DOB: April 10, 1946 DOA: 07/28/2020 PCP: Kermit Balo, DO   Brief HPI:   74 year old female with medical history of dementia, hyperlipidemia was brought to ED after she had nausea vomiting which started on Friday.  In the ED on EKG she was noted to have V. tach consistent with torsade de pointes.  Patient received potassium and magnesium in the ED.  She was found to have profound hypokalemia with potassium less than 2 on initial blood work.  Magnesium was 2.3.  She was also started on IV amiodarone.  Subjective   No acute issues or events overnight, diarrhea appears to be resolving, less frequent watery and in amount.  Otherwise denies chest pain, shortness of breath, headache, fevers, chills.   Assessment/Plan:    Torsades/Ventricular tachycardia in the setting of severe hypokalemia - HypoK provoked given diarrheal illness as below -Cardiology following, transition from amiodarone drip to metoprolol -Echocardiogram pending -Potassium repletion as below  Diarrhea, in the setting of sigmoid diverticulitis, does not meet sepsis criteria -Continue IV Zosyn.  -Stool panel pending, preliminary negative, C. difficile appears to be negative as well -Consider discharge in the next 24 to 48 hours if patient is able to keep up with p.o. intake given profound amount of GI losses -continue IV fluid support for the next 24 hours.  Hypokalemia - Continue to follow - currently WNL  Lab Results  Component Value Date   K 3.5 07/31/2020   Dementia-no behavior disturbance, will monitor.  Hyperlipidemia-continue rosuvastatin 5 mg daily.  Scheduled medications:   . Chlorhexidine Gluconate Cloth  6 each Topical Daily  . enoxaparin (LOVENOX) injection  30 mg Subcutaneous Q24H  . metoprolol succinate  12.5 mg Oral Daily  . rosuvastatin  5 mg Oral QHS     CBG: Recent Labs  Lab 07/28/20 0921 07/30/20 0624 07/31/20 0623  GLUCAP  192* 144* 114*    SpO2: 98 % O2 Flow Rate (L/min): 2 L/min    CBC: Recent Labs  Lab 07/28/20 0929 07/29/20 0102  WBC 11.0* 11.5*  HGB 11.7* 9.6*  HCT 34.5* 28.8*  MCV 88.5 88.3  PLT 262 196    Basic Metabolic Panel: Recent Labs  Lab 07/28/20 1135 07/28/20 1654 07/29/20 0059 07/29/20 0059 07/29/20 0836 07/29/20 0836 07/29/20 1517 07/30/20 0048 07/30/20 0428 07/30/20 0632 07/30/20 1606 07/31/20 0257  NA  --    < > 141   < > 141  --   --  141 143 144  --  138  K  --    < > 2.6*   < > 2.9*   < >  --  3.3* 3.2* 3.1* 3.8 3.5  CL  --    < > 106   < > 107  --   --  106 110 107  --  105  CO2  --    < > 29   < > 23  --   --  24 27 25   --  26  GLUCOSE  --    < > 169*   < > 194*  --   --  167* 161* 159*  --  159*  BUN  --    < > 8   < > 6*  --   --  5* <5* <5*  --  6*  CREATININE  --    < > 1.09*   < > 1.12*  --   --  0.89 0.82 0.86  --  0.87  CALCIUM  --    < > 8.1*   < > 7.9*  --   --  8.0* 7.9* 7.8*  --  8.0*  MG 2.2  --  2.6*  --   --   --  2.2  --   --   --   --   --   PHOS  --   --  3.1  --   --   --   --   --   --   --   --   --    < > = values in this interval not displayed.     Liver Function Tests: Recent Labs  Lab 07/28/20 0929  AST 25  ALT 23  ALKPHOS 51  BILITOT 0.6  PROT 6.9  ALBUMIN 3.6     Antibiotics: Anti-infectives (From admission, onward)   Start     Dose/Rate Route Frequency Ordered Stop   07/29/20 1300  piperacillin-tazobactam (ZOSYN) IVPB 3.375 g        3.375 g 12.5 mL/hr over 240 Minutes Intravenous Every 8 hours 07/29/20 1045     07/28/20 2100  piperacillin-tazobactam (ZOSYN) IVPB 3.375 g  Status:  Discontinued        3.375 g 12.5 mL/hr over 240 Minutes Intravenous Every 8 hours 07/28/20 1344 07/28/20 1538   07/28/20 1345  piperacillin-tazobactam (ZOSYN) IVPB 3.375 g        3.375 g 100 mL/hr over 30 Minutes Intravenous  Once 07/28/20 1344 07/28/20 1515       DVT prophylaxis: Lovenox  Code Status: Full code  Family  Communication: No family at bedside   Consultants:  Cardiology  PCCM  Procedures:      Objective   Vitals:   07/31/20 0100 07/31/20 0200 07/31/20 0300 07/31/20 0400  BP: (!) 107/52 (!) 102/59  (!) 112/59  Pulse: 64 77 60 (!) 56  Resp: 15 19 15 19   Temp:    98.1 F (36.7 C)  TempSrc:    Oral  SpO2: 93% 92% 93% 98%  Weight:      Height:        Intake/Output Summary (Last 24 hours) at 07/31/2020 0805 Last data filed at 07/31/2020 [id number] Gross per 24 hour  Intake 1248.39 ml  Output 1225 ml  Net 23.39 ml    11/15 1901 - 11/17 0700 In: 1372.6 [P.O.:720] Out: 2161 [Urine:2160]  Filed Weights   07/28/20 0908  Weight: 45.8 kg    Physical Examination:    General-appears in no acute distress  Heart-S1-S2, regular, no murmur auscultated  Lungs-clear to auscultation bilaterally, no wheezing or crackles auscultated  Abdomen-soft, nontender, no organomegaly  Extremities-no edema in the lower extremities  Neuro-alert, oriented x3, no focal deficit noted   Status is: Inpatient  Dispo: The patient is from: Home              Anticipated d/c is to: Home              Anticipated d/c date is: 07/31/2020              Patient currently not medically stable for discharge  Barrier to discharge-ongoing management for diarrhea, sigmoid diverticulitis, hypokalemia -patient unable to take adequate p.o. to offset GI losses.     Data Reviewed:   Recent Results (from the past 240 hour(s))  Respiratory Panel by RT PCR (Flu A&B, Covid) - Nasopharyngeal Swab     Status: None   Collection Time: 07/28/20 11:38 AM  Specimen: Nasopharyngeal Swab  Result Value Ref Range Status   SARS Coronavirus 2 by RT PCR NEGATIVE NEGATIVE Final    Comment: (NOTE) SARS-CoV-2 target nucleic acids are NOT DETECTED.  The SARS-CoV-2 RNA is generally detectable in upper respiratoy specimens during the acute phase of infection. The lowest concentration of SARS-CoV-2 viral copies this assay  can detect is 131 copies/mL. A negative result does not preclude SARS-Cov-2 infection and should not be used as the sole basis for treatment or other patient management decisions. A negative result may occur with  improper specimen collection/handling, submission of specimen other than nasopharyngeal swab, presence of viral mutation(s) within the areas targeted by this assay, and inadequate number of viral copies (<131 copies/mL). A negative result must be combined with clinical observations, patient history, and epidemiological information. The expected result is Negative.  Fact Sheet for Patients:  https://www.moore.com/  Fact Sheet for Healthcare Providers:  https://www.young.biz/  This test is no t yet approved or cleared by the [place] FDA and  has been authorized for detection and/or diagnosis of SARS-CoV-2 by FDA under an Emergency Use Authorization (EUA). This EUA will remain  in effect (meaning this test can be used) for the duration of the COVID-19 declaration under Section 564(b)(1) of the Act, 21 U.S.C. section 360bbb-3(b)(1), unless the authorization is terminated or revoked sooner.     Influenza A by PCR NEGATIVE NEGATIVE Final   Influenza B by PCR NEGATIVE NEGATIVE Final    Comment: (NOTE) The Xpert Xpress SARS-CoV-2/FLU/RSV assay is intended as an aid in  the diagnosis of influenza from Nasopharyngeal swab specimens and  should not be used as a sole basis for treatment. Nasal washings and  aspirates are unacceptable for Xpert Xpress SARS-CoV-2/FLU/RSV  testing.  Fact Sheet for Patients: https://www.moore.com/  Fact Sheet for Healthcare Providers: https://www.young.biz/  This test is not yet approved or cleared by the [place] FDA and  has been authorized for detection and/or diagnosis of SARS-CoV-2 by  FDA under an Emergency Use Authorization (EUA). This EUA will remain   in effect (meaning this test can be used) for the duration of the  Covid-19 declaration under Section 564(b)(1) of the Act, 21  U.S.C. section 360bbb-3(b)(1), unless the authorization is  terminated or revoked. Performed at [facility], [address], [place], [place] [address]   Urine culture     Status: None   Collection Time: 07/28/20 12:36 PM   Specimen: Urine, Random  Result Value Ref Range Status   Specimen Description URINE, RANDOM  Final   Special Requests NONE  Final   Culture   Final    NO GROWTH Performed at [facility], [address] [place], [place] [address]    Report Status 07/29/2020 FINAL  Final  Blood culture (routine x 2)     Status: None (Preliminary result)   Collection Time: 07/28/20  1:37 PM   Specimen: BLOOD  Result Value Ref Range Status   Specimen Description BLOOD RIGHT ANTECUBITAL  Final   Special Requests   Final    AEROBIC BOTTLE ONLY Blood Culture results may not be optimal due to an inadequate volume of blood received in culture bottles   Culture   Final    NO GROWTH 2 DAYS Performed at [facility], [address] [place], [place] [address]    Report Status PENDING  Incomplete  Blood culture (routine x 2)     Status: None (Preliminary result)   Collection  Time: 07/28/20  2:32 PM   Specimen: BLOOD  Result Value Ref Range Status   Specimen Description BLOOD SITE NOT SPECIFIED  Final   Special Requests   Final    BOTTLES DRAWN AEROBIC ONLY Blood Culture results may not be optimal due to an inadequate volume of blood received in culture bottles   Culture   Final    NO GROWTH 2 DAYS Performed at [facility], [address] [place], [place] [address]    Report Status PENDING  Incomplete  MRSA PCR Screening     Status: None   Collection Time: 07/28/20  6:25 PM   Specimen: Nasopharyngeal  Result Value Ref Range Status   MRSA by PCR NEGATIVE NEGATIVE Final    Comment:        The GeneXpert MRSA Assay  (FDA approved for NASAL specimens only), is one component of a comprehensive MRSA colonization surveillance program. It is not intended to diagnose MRSA infection nor to guide or monitor treatment for MRSA infections. Performed at [facility], [address] [place], [place] [address]   C Difficile Quick Screen w PCR reflex     Status: None   Collection Time: 07/30/20  6:48 PM   Specimen: STOOL  Result Value Ref Range Status   C Diff antigen NEGATIVE NEGATIVE Final   C Diff toxin NEGATIVE NEGATIVE Final   C Diff interpretation No C. difficile detected.  Final    Comment: Performed at [facility], [address], [place], [place] [address]    No results for input(s): LIPASE, AMYLASE in the last 168 hours. No results for input(s): AMMONIA in the last 168 hours.  Cardiac Enzymes: No results for input(s): CKTOTAL, CKMB, CKMBINDEX, TROPONINI in the last 168 hours. BNP (last 3 results) No results for input(s): BNP in the last 8760 hours.  ProBNP (last 3 results) No results for input(s): PROBNP in the last 8760 hours.  Studies:  ECHOCARDIOGRAM COMPLETE  Result Date: 07/29/2020    ECHOCARDIOGRAM REPORT   Patient Name:   [facility] Date of Exam: 07/29/2020 Medical Rec #:  [id number]    Height:       66.0 in Accession #:    [id number]   Weight:       101.0 lb Date of Birth:  09-08-1946    BSA:          1.496 m Patient Age:    74 years     BP:           141/77 mmHg Patient Gender: F            HR:           78 bpm. [facility]am [facility] Procedure: 2D Echo Indications:    Ventricular Tachycardia I47.2  History:        Patient has no prior history of Echocardiogram examinations.                 Risk Factors:Non-Smoker, Diabetes and Dyslipidemia. Torsades de                 pointes , Dementia without behavioral disturbance.  Sonographer:    Jeryl Columbia Referring Phys: [id number] SUNIT TOLIA IMPRESSIONS  1. Left ventricular ejection fraction, by estimation, is 50 to 55%.  The left ventricle has low normal function. The left ventricle has no regional wall motion abnormalities. Left ventricular diastolic parameters are consistent with Grade II diastolic dysfunction (pseudonormalization).  2. Right  ventricular systolic function is normal. The right ventricular size is normal.  3. The mitral valve is normal in structure. Moderate mitral valve regurgitation. No evidence of mitral stenosis.  4. The aortic valve is normal in structure. Aortic valve regurgitation is not visualized. No aortic stenosis is present.  5. The inferior vena cava is normal in size with greater than 50% respiratory variability, suggesting right atrial pressure of 3 mmHg. FINDINGS  Left Ventricle: Left ventricular ejection fraction, by estimation, is 50 to 55%. The left ventricle has low normal function. The left ventricle has no regional wall motion abnormalities. The left ventricular internal cavity size was normal in size. There is no left ventricular hypertrophy. Left ventricular diastolic parameters are consistent with Grade II diastolic dysfunction (pseudonormalization). Right Ventricle: The right ventricular size is normal. No increase in right ventricular wall thickness. Right ventricular systolic function is normal. Left Atrium: Left atrial size was normal in size. Right Atrium: Right atrial size was normal in size. Pericardium: There is no evidence of pericardial effusion. Mitral Valve: The mitral valve is normal in structure. Moderate mitral valve regurgitation. No evidence of mitral valve stenosis. Tricuspid Valve: The tricuspid valve is normal in structure. Tricuspid valve regurgitation is mild . No evidence of tricuspid stenosis. Aortic Valve: The aortic valve is normal in structure. Aortic valve regurgitation is not visualized. No aortic stenosis is present. Pulmonic Valve: The pulmonic valve was normal in structure. Pulmonic valve regurgitation is not visualized. No evidence of pulmonic stenosis.  Aorta: The aortic root is normal in size and structure. Venous: The inferior vena cava is normal in size with greater than 50% respiratory variability, suggesting right atrial pressure of 3 mmHg. IAS/Shunts: No atrial level shunt detected by color flow Doppler.  LEFT VENTRICLE PLAX 2D LVIDd:         4.50 cm  Diastology LVIDs:         3.30 cm  LV e' medial:    5.00 cm/s LV PW:         1.00 cm  LV E/e' medial:  21.0 LV IVS:        0.90 cm  LV e' lateral:   10.80 cm/s LVOT diam:     1.50 cm  LV E/e' lateral: 9.7 LVOT Area:     1.77 cm  RIGHT VENTRICLE RV S prime:     12.70 cm/s TAPSE (M-mode): 2.0 cm LEFT ATRIUM             Index       RIGHT ATRIUM           Index LA diam:        2.50 cm 1.67 cm/m  RA Area:     10.20 cm LA Vol (A2C):   28.8 ml 19.25 ml/m RA Volume:   20.90 ml  13.97 ml/m LA Vol (A4C):   22.8 ml 15.24 ml/m LA Biplane Vol: 26.9 ml 17.98 ml/m   AORTA Ao Root diam: 2.50 cm MITRAL VALVE                TRICUSPID VALVE MV Area (PHT): 3.37 cm     TR Peak grad:   18.3 mmHg MV Decel Time: 225 msec     TR Vmax:        214.00 cm/s MR Peak grad: 106.1 mmHg MR Mean grad: 73.0 mmHg     SHUNTS MR Vmax:      515.00 cm/s   Systemic Diam: 1.50 cm MR Vmean:  406.0 cm/s MV E velocity: 105.00 cm/s MV A velocity: 69.00 cm/s MV E/A ratio:  1.52 Donato Schultz MD Electronically signed by Donato Schultz MD Signature Date/Time: 07/29/2020/3:11:14 PM    Final     Azucena Fallen DO   Pager: Secure chat  07/31/2020, 8:05 AM  LOS: 3 days

## 2020-07-31 NOTE — Evaluation (Signed)
Physical Therapy Evaluation Patient Details Name: Stephanie Acevedo MRN: [id number] DOB: October 15, 1945 Today's Date: 07/31/2020   History of Present Illness  Pt is a 74 y.o. F with significant PMH of dementia who presents with torsades de point in setting of severe hypokalemia.  Clinical Impression  Pt has history of dementia but pt husband reports she is independent with ADL's/mobility and denies history of falls. Pt ambulating 60 feet with no assistive device at a min guard assist level, SpO2 96% on RA, HR stable in 80's. Pt with balance deficits and antalgic gait pattern noted; pt husband reports this is baseline due to "right leg issues," (did not have further details). Recommended use of cane vs walker at home for increased stability. Pt husband reports they have DME already. Don't anticipate need for PT follow up.     Follow Up Recommendations No PT follow up;Supervision/Assistance - 24 hour    Equipment Recommendations  None recommended by PT (has DME)   Recommendations for Other Services       Precautions / Restrictions Precautions Precautions: Fall Restrictions Weight Bearing Restrictions: No      Mobility  Bed Mobility Overal bed mobility: Modified Independent                  Transfers Overall transfer level: Needs assistance Equipment used: None Transfers: Sit to/from Stand Sit to Stand: Min guard         General transfer comment: Min guard for safety for transitions from [facility] and edge of bed  Ambulation/Gait Ambulation/Gait assistance: Min guard Gait Distance (Feet): 60 Feet Assistive device: None Gait Pattern/deviations: Step-through pattern;Antalgic;Decreased stance time - right;Narrow base of support Gait velocity: decreased Gait velocity interpretation: <1.8 ft/sec, indicate of risk for recurrent falls General Gait Details: Pt with mildly unsteady gait, reaching for external support, antalgic gait pattern  Stairs            Wheelchair  Mobility    Modified Rankin (Stroke Patients Only)       Balance Overall balance assessment: Needs assistance Sitting-balance support: Feet supported Sitting balance-Leahy Scale: Good     Standing balance support: No upper extremity supported;During functional activity Standing balance-Leahy Scale: Poor Standing balance comment: reaching for single UE support                             Pertinent Vitals/Pain Pain Assessment: No/denies pain    Home Living Family/patient expects to be discharged to:: Private residence Living Arrangements: Spouse/significant other Available Help at Discharge: Family Type of Home: House Home Access: Stairs to enter   Secretary/administrator of Steps:  (1) Home Layout: One level Home Equipment: Environmental consultant - 2 wheels;Walker - 4 wheels;Cane - single point      Prior Function Level of Independence: Needs assistance   Gait / Transfers Assistance Needed: Independent, no history of falls  ADL's / Homemaking Assistance Needed: independent ADL's, assist IADL's        Hand Dominance        Extremity/Trunk Assessment   Upper Extremity Assessment Upper Extremity Assessment: Overall WFL for tasks assessed    Lower Extremity Assessment Lower Extremity Assessment: Overall WFL for tasks assessed       Communication   Communication: No difficulties  Cognition Arousal/Alertness: Awake/alert Behavior During Therapy: WFL for tasks assessed/performed Overall Cognitive Status: History of cognitive impairments - at baseline  General Comments: Pt oriented to self, follows commands      General Comments      Exercises     Assessment/Plan    PT Assessment Patient needs continued PT services  PT Problem List Decreased strength;Decreased activity tolerance;Decreased balance;Decreased mobility;Decreased cognition       PT Treatment Interventions DME instruction;Gait training;Functional  mobility training;Therapeutic activities;Therapeutic exercise;Balance training;Stair training;Patient/family education    PT Goals (Current goals can be found in the Care Plan section)  Acute Rehab PT Goals Patient Stated Goal: did not state PT Goal Formulation: With patient/family Time For Goal Achievement: 08/14/20 Potential to Achieve Goals: Fair    Frequency Min 3X/week   Barriers to discharge        Co-evaluation               AM-PAC PT "6 Clicks" Mobility  Outcome Measure Help needed turning from your back to your side while in a flat bed without using bedrails?: None Help needed moving from lying on your back to sitting on the side of a flat bed without using bedrails?: None Help needed moving to and from a bed to a chair (including a wheelchair)?: A Little Help needed standing up from a chair using your arms (e.g., wheelchair or bedside chair)?: A Little Help needed to walk in hospital room?: A Little Help needed climbing 3-5 steps with a railing? : A Little 6 Click Score: 20    End of Session   Activity Tolerance: Patient tolerated treatment well Patient left: in bed;with call bell/phone within reach;with bed alarm set Nurse Communication: Mobility status PT Visit Diagnosis: Unsteadiness on feet (R26.81);Muscle weakness (generalized) (M62.81)    Time: 4174-0814 PT Time Calculation (min) (ACUTE ONLY): 17 min   Charges:   PT Evaluation $PT Eval Moderate Complexity: 1 Mod          Lillia Pauls, PT, DPT Acute Rehabilitation Services Pager [phone] Office [phone]   Norval Morton 07/31/2020, 12:41 PM

## 2020-07-31 NOTE — Telephone Encounter (Signed)
Called patient, spoke with patient husband/female at home and they are not sure when patient will be discharged.

## 2020-07-31 NOTE — Telephone Encounter (Signed)
Pt is still currently admitted in the hospital. AD/S

## 2020-08-01 LAB — BASIC METABOLIC PANEL
Anion gap: 6 (ref 5–15)
BUN: 6 mg/dL — ABNORMAL LOW (ref 8–23)
CO2: 28 mmol/L (ref 22–32)
Calcium: 8 mg/dL — ABNORMAL LOW (ref 8.9–10.3)
Chloride: 105 mmol/L (ref 98–111)
Creatinine, Ser: 0.96 mg/dL (ref 0.44–1.00)
GFR, Estimated: >60 mL/min (ref >60)
Glucose, Bld: 125 mg/dL — ABNORMAL HIGH (ref 70–99)
Potassium: 2.9 mmol/L — ABNORMAL LOW (ref 3.5–5.1)
Sodium: 139 mmol/L (ref 135–145)

## 2020-08-01 LAB — GLUCOSE, CAPILLARY
Glucose-Capillary: 129 mg/dL — ABNORMAL HIGH (ref 70–99)
Glucose-Capillary: 153 mg/dL — ABNORMAL HIGH (ref 70–99)

## 2020-08-01 LAB — CBC
HCT: 27.5 % — ABNORMAL LOW (ref 36.0–46.0)
Hemoglobin: 9.1 g/dL — ABNORMAL LOW (ref 12.0–15.0)
MCH: 29.4 pg (ref 26.0–34.0)
MCHC: 33.1 g/dL (ref 30.0–36.0)
MCV: 88.7 fL (ref 80.0–100.0)
Platelets: 196 10*3/uL (ref 150–400)
RBC: 3.1 MIL/uL — ABNORMAL LOW (ref 3.87–5.11)
RDW: 12.7 % (ref 11.5–15.5)
WBC: 8.5 10*3/uL (ref 4.0–10.5)
nRBC: 0 % (ref 0.0–0.2)

## 2020-08-01 MED ORDER — POTASSIUM CHLORIDE 10 MEQ/100ML IV SOLN
10.0000 meq | INTRAVENOUS | Status: AC
  Administered 2020-08-01 (×4): 10 meq via INTRAVENOUS
  Filled 2020-08-01 (×3): qty 100

## 2020-08-01 MED ORDER — POTASSIUM CHLORIDE CRYS ER 10 MEQ PO TBCR
30.0000 meq | EXTENDED_RELEASE_TABLET | Freq: Two times a day (BID) | ORAL | Status: AC
  Administered 2020-08-01 (×2): 30 meq via ORAL
  Filled 2020-08-01 (×2): qty 3

## 2020-08-01 NOTE — Progress Notes (Signed)
Message sent to pharmacy requesting doses of the loperamide 2mg .

## 2020-08-01 NOTE — Care Management Important Message (Signed)
Important Message  Patient Details  Name: Stephanie Acevedo MRN: [id number] Date of Birth: Apr 19, 1946   Medicare Important Message Given:  Yes     Renie Ora 08/01/2020, 11:08 AM

## 2020-08-01 NOTE — Progress Notes (Signed)
Pharmacy Antibiotic Note  Stephanie Acevedo is a 74 y.o. female on day # 4 Zosyn for sigmoid diverticulitis.  Renal function stable, no dosage changes expected.  Plan: Continue Zosyn 3.375 gm IV q8h (each over 4 hours) Noted likely transition to Augmentin at discharge. Will follow renal function, final culture data, antibiotic plans.  Height: 5\' 6"  (167.6 cm) Weight: 49.3 kg (108 lb 11.2 oz) IBW/kg (Calculated) : 59.3  Temp (24hrs), Avg:98.5 F (36.9 C), Min:97.9 F (36.6 C), Max:99 F (37.2 C)  Recent Labs  Lab 07/28/20 0929 07/28/20 1135 07/28/20 1432 07/28/20 1654 07/29/20 0102 07/29/20 0836 07/30/20 0048 07/30/20 0428 07/30/20 0632 07/31/20 0257 08/01/20 0333  WBC 11.0*  --   --   --  11.5*  --   --   --   --   --  8.5  CREATININE 0.92  --   --    < >  --    < > 0.89 0.82 0.86 0.87 0.96  LATICACIDVEN  --  2.6* 3.3*  --   --   --   --   --   --   --   --    < > = values in this interval not displayed.    Estimated Creatinine Clearance: 40.6 mL/min (by C-G formula based on SCr of 0.96 mg/dL).    No Known Allergies  Antimicrobials this admission: Zosyn 11/14 x 1> continued 11/15 >>  Dose adjustments this admission:  n/a  Microbiology results:  11/16 C diff: negative  11/16 GI panel: negative  11/14 blood x 2: no growth x 4 days to date  11/14 urine: negative  11/14 MRSA PCR: negative  Thank you for allowing pharmacy to be a part of this patient's care.  12/14, Dennie Fetters Phone: [phone] 08/01/2020 12:48 PM

## 2020-08-01 NOTE — Progress Notes (Signed)
[facility]  PROGRESS NOTE  Fareeda Downard ZOX:[id number] DOB: [birth year] DOA: 07/28/2020 PCP: Kermit Balo, DO   Brief HPI:   74 year old female with medical history of dementia, hyperlipidemia was brought to ED after she had nausea vomiting which started on Friday.  In the ED on EKG she was noted to have V. tach consistent with torsade de pointes.  Patient received potassium and magnesium in the ED.  She was found to have profound hypokalemia with potassium less than 2 on initial blood work.  Magnesium was 2.3.  She was also started on IV amiodarone.  Subjective   No acute issues or events overnight, diarrhea appears to be resolving, less frequent watery and in amount.  Otherwise denies chest pain, shortness of breath, headache, fevers, chills.   Assessment/Plan:    Torsades/Ventricular tachycardia in the setting of severe hypokalemia - HypoK provoked given diarrheal illness as below -Cardiology following, continue metoprolol -Echocardiogram shows EF 50 to 55% with low normal function no wall abnormalities with grade 2 diastolic dysfunction -Potassium repletion ongoing as below  Diarrhea, in the setting of sigmoid diverticulitis, does not meet sepsis criteria -Continue IV Zosyn -likely transition to Augmentin at discharge.  -Stool infectious panel grossly negative: C. difficile panel negative as well -Diarrhea ongoing, encourage increased p.o. intake.  Hypokalemia -Patient continues to have profound hypokalemia in the setting of above, continue repletion Lab Results  Component Value Date   K 2.9 (L) 08/01/2020   Dementia-no behavior disturbance, will monitor.  Hyperlipidemia-continue rosuvastatin 5 mg daily.  Scheduled medications:   . Chlorhexidine Gluconate Cloth  6 each Topical Daily  . enoxaparin (LOVENOX) injection  30 mg Subcutaneous Q24H  . metoprolol succinate  12.5 mg Oral Daily  . potassium chloride  30 mEq Oral BID  . rosuvastatin  5 mg Oral QHS      CBG: Recent Labs  Lab 07/31/20 0623 07/31/20 1143 07/31/20 1619 07/31/20 2119 08/01/20 0623  GLUCAP 114* 167* 149* 142* 129*   CBC: Recent Labs  Lab 07/28/20 0929 07/29/20 0102 08/01/20 0333  WBC 11.0* 11.5* 8.5  HGB 11.7* 9.6* 9.1*  HCT 34.5* 28.8* 27.5*  MCV 88.5 88.3 88.7  PLT 262 196 196    Basic Metabolic Panel: Recent Labs  Lab 07/28/20 1135 07/28/20 1654 07/29/20 0059 07/29/20 0836 07/29/20 1517 07/30/20 0048 07/30/20 0048 07/30/20 0428 07/30/20 0632 07/30/20 1606 07/31/20 0257 08/01/20 0333  NA  --    < > 141   < >  --  141  --  143 144  --  138 139  K  --    < > 2.6*   < >  --  3.3*   < > 3.2* 3.1* 3.8 3.5 2.9*  CL  --    < > 106   < >  --  106  --  110 107  --  105 105  CO2  --    < > 29   < >  --  24  --  27 25  --  26 28  GLUCOSE  --    < > 169*   < >  --  167*  --  161* 159*  --  159* 125*  BUN  --    < > 8   < >  --  5*  --  <5* <5*  --  6* 6*  CREATININE  --    < > 1.09*   < >  --  0.89  --  0.82  0.86  --  0.87 0.96  CALCIUM  --    < > 8.1*   < >  --  8.0*  --  7.9* 7.8*  --  8.0* 8.0*  MG 2.2  --  2.6*  --  2.2  --   --   --   --   --   --   --   PHOS  --   --  3.1  --   --   --   --   --   --   --   --   --    < > = values in this interval not displayed.     Liver Function Tests: Recent Labs  Lab 07/28/20 0929  AST 25  ALT 23  ALKPHOS 51  BILITOT 0.6  PROT 6.9  ALBUMIN 3.6     Antibiotics: Anti-infectives (From admission, onward)   Start     Dose/Rate Route Frequency Ordered Stop   07/29/20 1300  piperacillin-tazobactam (ZOSYN) IVPB 3.375 g        3.375 g 12.5 mL/hr over 240 Minutes Intravenous Every 8 hours 07/29/20 1045     07/28/20 2100  piperacillin-tazobactam (ZOSYN) IVPB 3.375 g  Status:  Discontinued        3.375 g 12.5 mL/hr over 240 Minutes Intravenous Every 8 hours 07/28/20 1344 07/28/20 1538   07/28/20 1345  piperacillin-tazobactam (ZOSYN) IVPB 3.375 g        3.375 g 100 mL/hr over 30 Minutes Intravenous   Once 07/28/20 1344 07/28/20 1515       DVT prophylaxis: Lovenox  Code Status: Full code  Family Communication: No family at bedside   Consultants:  Cardiology  PCCM    Objective   Vitals:   07/31/20 1620 07/31/20 1946 08/01/20 0031 08/01/20 0450  BP: 139/70 126/61 119/62 123/66  Pulse: 72 75 71 74  Resp: Temp: 99 F (37.2 C) 99 F (37.2 C) 98.2 F (36.8 C) 98.4 F (36.9 C)  TempSrc: Oral Oral Oral Oral  SpO2: 92% 91% 94% 90%  Weight:   49.3 kg   Height:        Intake/Output Summary (Last 24 hours) at 08/01/2020 0734 Last data filed at 08/01/2020 0300 Gross per 24 hour  Intake 733.14 ml  Output 3 ml  Net 730.14 ml    11/16 1901 - 11/18 0700 In: 783.2 [P.O.:477; I.V.:256.1] Out: 448 [Urine:446]  Filed Weights   07/28/20 0908 08/01/20 0031  Weight: 45.8 kg 49.3 kg    Physical Examination:    General-appears in no acute distress  Heart-S1-S2, regular, no murmur auscultated  Lungs-clear to auscultation bilaterally, no wheezing or crackles auscultated  Abdomen-soft, nontender, no organomegaly  Extremities-no edema in the lower extremities  Neuro-alert, oriented x3, no focal deficit noted   Status is: Inpatient  Dispo: The patient is from: Home              Anticipated d/c is to: Home              Anticipated d/c date is: 08/02/2020              Patient currently not medically stable for discharge  Barrier to discharge-ongoing management for diarrhea, sigmoid diverticulitis, hypokalemia -patient unable to take in adequate p.o. to offset GI losses/hypokalemia.     Data Reviewed:   Recent Results (from the past 240 hour(s))  Respiratory Panel by RT PCR (Flu A&B, Covid) - Nasopharyngeal Swab  Status: None   Collection Time: 07/28/20 11:38 AM   Specimen: Nasopharyngeal Swab  Result Value Ref Range Status   SARS Coronavirus 2 by RT PCR NEGATIVE NEGATIVE Final    Comment: (NOTE) SARS-CoV-2 target nucleic acids are NOT  DETECTED.  The SARS-CoV-2 RNA is generally detectable in upper respiratoy specimens during the acute phase of infection. The lowest concentration of SARS-CoV-2 viral copies this assay can detect is 131 copies/mL. A negative result does not preclude SARS-Cov-2 infection and should not be used as the sole basis for treatment or other patient management decisions. A negative result may occur with  improper specimen collection/handling, submission of specimen other than nasopharyngeal swab, presence of viral mutation(s) within the areas targeted by this assay, and inadequate number of viral copies (<131 copies/mL). A negative result must be combined with clinical observations, patient history, and epidemiological information. The expected result is Negative.  Fact Sheet for Patients:  https://www.moore.com/https://www.fda.gov/media/142436/download  Fact Sheet for Healthcare Providers:  https://www.young.biz/https://www.fda.gov/media/142435/download  This test is no t yet approved or cleared by the [place] FDA and  has been authorized for detection and/or diagnosis of SARS-CoV-2 by FDA under an Emergency Use Authorization (EUA). This EUA will remain  in effect (meaning this test can be used) for the duration of the COVID-19 declaration under Section 564(b)(1) of the Act, 21 U.S.C. section 360bbb-3(b)(1), unless the authorization is terminated or revoked sooner.     Influenza A by PCR NEGATIVE NEGATIVE Final   Influenza B by PCR NEGATIVE NEGATIVE Final    Comment: (NOTE) The Xpert Xpress SARS-CoV-2/FLU/RSV assay is intended as an aid in  the diagnosis of influenza from Nasopharyngeal swab specimens and  should not be used as a sole basis for treatment. Nasal washings and  aspirates are unacceptable for Xpert Xpress SARS-CoV-2/FLU/RSV  testing.  Fact Sheet for Patients: https://www.moore.com/https://www.fda.gov/media/142436/download  Fact Sheet for Healthcare Providers: https://www.young.biz/https://www.fda.gov/media/142435/download  This test is not yet  approved or cleared by the [place] FDA and  has been authorized for detection and/or diagnosis of SARS-CoV-2 by  FDA under an Emergency Use Authorization (EUA). This EUA will remain  in effect (meaning this test can be used) for the duration of the  Covid-19 declaration under Section 564(b)(1) of the Act, 21  U.S.C. section 360bbb-3(b)(1), unless the authorization is  terminated or revoked. Performed at [facility], [address] [place], [place] [address]   Urine culture     Status: None   Collection Time: 07/28/20 12:36 PM   Specimen: Urine, Random  Result Value Ref Range Status   Specimen Description URINE, RANDOM  Final   Special Requests NONE  Final   Culture   Final    NO GROWTH Performed at [facility], [address] [place], [place] [address]    Report Status 07/29/2020 FINAL  Final  Blood culture (routine x 2)     Status: None (Preliminary result)   Collection Time: 07/28/20  1:37 PM   Specimen: BLOOD  Result Value Ref Range Status   Specimen Description BLOOD RIGHT ANTECUBITAL  Final   Special Requests   Final    AEROBIC BOTTLE ONLY Blood Culture results may not be optimal due to an inadequate volume of blood received in culture bottles   Culture   Final    NO GROWTH 3 DAYS Performed at [facility], [address] [place], [place] [address]    Report Status PENDING  Incomplete  Blood culture (routine x 2)  Status: None (Preliminary result)   Collection Time: 07/28/20  2:32 PM   Specimen: BLOOD  Result Value Ref Range Status   Specimen Description BLOOD SITE NOT SPECIFIED  Final   Special Requests   Final    BOTTLES DRAWN AEROBIC ONLY Blood Culture results may not be optimal due to an inadequate volume of blood received in culture bottles   Culture   Final    NO GROWTH 3 DAYS Performed at [facility], [address], [place], [place] [address]    Report Status PENDING  Incomplete  MRSA PCR Screening     Status: None    Collection Time: 07/28/20  6:25 PM   Specimen: Nasopharyngeal  Result Value Ref Range Status   MRSA by PCR NEGATIVE NEGATIVE Final    Comment:        The GeneXpert MRSA Assay (FDA approved for NASAL specimens only), is one component of a comprehensive MRSA colonization surveillance program. It is not intended to diagnose MRSA infection nor to guide or monitor treatment for MRSA infections. Performed at [facility], [address], [place], [place] [address]   C Difficile Quick Screen w PCR reflex     Status: None   Collection Time: 07/30/20  6:48 PM   Specimen: STOOL  Result Value Ref Range Status   C Diff antigen NEGATIVE NEGATIVE Final   C Diff toxin NEGATIVE NEGATIVE Final   C Diff interpretation No C. difficile detected.  Final    Comment: Performed at [facility], [address], [place], [place] [address]  Gastrointestinal Panel by PCR , Stool     Status: None   Collection Time: 07/30/20  6:48 PM   Specimen: Stool  Result Value Ref Range Status   Campylobacter species NOT DETECTED NOT DETECTED Final   Plesimonas shigelloides NOT DETECTED NOT DETECTED Final   Salmonella species NOT DETECTED NOT DETECTED Final   Yersinia enterocolitica NOT DETECTED NOT DETECTED Final   Vibrio species NOT DETECTED NOT DETECTED Final   Vibrio cholerae NOT DETECTED NOT DETECTED Final   Enteroaggregative E coli (EAEC) NOT DETECTED NOT DETECTED Final   Enteropathogenic E coli (EPEC) NOT DETECTED NOT DETECTED Final   Enterotoxigenic E coli (ETEC) NOT DETECTED NOT DETECTED Final   Shiga like toxin producing E coli (STEC) NOT DETECTED NOT DETECTED Final   Shigella/Enteroinvasive E coli (EIEC) NOT DETECTED NOT DETECTED Final   Cryptosporidium NOT DETECTED NOT DETECTED Final   Cyclospora cayetanensis NOT DETECTED NOT DETECTED Final   Entamoeba histolytica NOT DETECTED NOT DETECTED Final   Giardia lamblia NOT DETECTED NOT DETECTED Final   Adenovirus F40/41 NOT DETECTED NOT DETECTED  Final   Astrovirus NOT DETECTED NOT DETECTED Final   Norovirus GI/GII NOT DETECTED NOT DETECTED Final   Rotavirus A NOT DETECTED NOT DETECTED Final   Sapovirus (I, II, IV, and V) NOT DETECTED NOT DETECTED Final    Comment: Performed at [facility], [address] [place], [place] [address]    No results for input(s): LIPASE, AMYLASE in the last 168 hours. No results for input(s): AMMONIA in the last 168 hours.  Cardiac Enzymes: No results for input(s): CKTOTAL, CKMB, CKMBINDEX, TROPONINI in the last 168 hours. BNP (last 3 results) No results for input(s): BNP in the last 8760 hours.  ProBNP (last 3 results) No results for input(s): PROBNP in the last 8760 hours.  Studies:  No results found.  Azucena Fallen DO   Pager: Secure  chat  08/01/2020, 7:34 AM  LOS: 4 days

## 2020-08-01 NOTE — Progress Notes (Signed)
Loperamide received from pharmacy, first dose administered. Aim is to decrease the amount of bowel movements patient has overnight not just for comfort, but to aid in preventing rapid drop in potassium level again. Patient and spouse educated on use and purpose of loperamide, all questions answered.

## 2020-08-01 NOTE — Plan of Care (Signed)
Patient on room air overnight and continent/incontinent of urine.   No BM overnight.  Normal bowel sounds.  Needs met during hourly rounding

## 2020-08-01 NOTE — Progress Notes (Signed)
As patient had no orders or need for insulin coverage, MD consulted and ACHS CBG checks are now discontinued.

## 2020-08-01 NOTE — Progress Notes (Signed)
Patient alert and oriented to person only. She does not reorient well but she is calm, cooperative and pleasant. Bilateral lung sounds clear but diminished throughout, HR regular. Bowel sounds active all quads and one soft BM incontinent episode this morning for which pericare was provided and linen changed. While cleaning patient, she was noted to have a nonblancheable area to her sacrum; mepilex dressing applied. Potassium dropped significantly overnight, so replacing via IV as well as PO to see if maintenance can be achieved. MD stated that we will monitor this overnight to ensure it does not drop again. This was communicated to her spouse who agreed with POC. Patient requires assist x 1 with ambulation due to being a high fall risk and unsteady on her feet; otherwise, she is able to feed seld without difficulty and reposition herself in bed without assistance. Patient denies pain and exhibits no s/s thereof.Call light in reach, spouse at bedside, will provide frequent verbal contacts to ensure needs are met.

## 2020-08-02 DIAGNOSIS — E860 Dehydration: Secondary | ICD-10-CM

## 2020-08-02 LAB — BASIC METABOLIC PANEL
Anion gap: 9 (ref 5–15)
BUN: 8 mg/dL (ref 8–23)
CO2: 25 mmol/L (ref 22–32)
Calcium: 8.5 mg/dL — ABNORMAL LOW (ref 8.9–10.3)
Chloride: 107 mmol/L (ref 98–111)
Creatinine, Ser: 0.92 mg/dL (ref 0.44–1.00)
GFR, Estimated: >60 mL/min (ref >60)
Glucose, Bld: 134 mg/dL — ABNORMAL HIGH (ref 70–99)
Potassium: 4 mmol/L (ref 3.5–5.1)
Sodium: 141 mmol/L (ref 135–145)

## 2020-08-02 LAB — CULTURE, BLOOD (ROUTINE X 2)
Culture: NO GROWTH
Culture: NO GROWTH

## 2020-08-02 LAB — CBC
HCT: 30.9 % — ABNORMAL LOW (ref 36.0–46.0)
Hemoglobin: 10.3 g/dL — ABNORMAL LOW (ref 12.0–15.0)
MCH: 29.3 pg (ref 26.0–34.0)
MCHC: 33.3 g/dL (ref 30.0–36.0)
MCV: 88 fL (ref 80.0–100.0)
Platelets: 221 10*3/uL (ref 150–400)
RBC: 3.51 MIL/uL — ABNORMAL LOW (ref 3.87–5.11)
RDW: 12.5 % (ref 11.5–15.5)
WBC: 8.6 10*3/uL (ref 4.0–10.5)
nRBC: 0 % (ref 0.0–0.2)

## 2020-08-02 MED ORDER — METOPROLOL SUCCINATE ER 25 MG PO TB24
12.5000 mg | ORAL_TABLET | Freq: Every day | ORAL | 0 refills | Status: DC
Start: 2020-08-02 — End: 2020-11-21

## 2020-08-02 MED ORDER — POTASSIUM CHLORIDE 10 MEQ/100ML IV SOLN
INTRAVENOUS | Status: AC
  Filled 2020-08-02: qty 100

## 2020-08-02 MED ORDER — LOPERAMIDE HCL 1 MG/7.5ML PO SUSP: 2.0000 mg | ORAL | 0 refills | Status: DC | PRN

## 2020-08-02 NOTE — Discharge Summary (Signed)
Physician Discharge Summary  [name] ZOX:[id number] DOB: [birth year] DOA: 07/28/2020  PCP: Kermit Balo, DO  Admit date: 07/28/2020 Discharge date: 08/02/2020  Admitted From: Home Disposition: Home  Recommendations for Outpatient Follow-up:  1. Follow up with PCP in 1-2 weeks 2. Please obtain BMP/CBC in one week  Discharge Condition: Stable CODE STATUS: Full Diet recommendation: As tolerated  Brief/Interim Summary: 74 year old female with medical history of dementia, hyperlipidemia was brought to ED after she had nausea vomiting which started on Friday.  In the ED on EKG she was noted to have V. tach consistent with torsade de pointes.  Patient received potassium and magnesium in the ED.  She was found to have profound hypokalemia with potassium less than 2 on initial blood work.  Magnesium was 2.3.  She was also started on IV amiodarone.  Patient admitted as above with acute V. tach with torsades in the setting of profound hypokalemia, patient noted to have profound diarrhea likely causing hypokalemia which is now resolved.  Patient's C. difficile and GI infection panel has remained negative, thus antibiotics have been discontinued and patient continues to improve which is supportive care.  Patient remains without fever, leukocytosis or other signs of clinical infection.  Lengthy discussion with patient and husband today at bedside, recommend close follow-up with PCP next week for repeat CBC and BMP to follow anemia which was 1 of husband's main concerns which appears to be stable from baseline as well as to follow potassium given her recent stay.  Discharge Diagnoses:  Active Problems:   Torsades de pointes (HCC)   NSVT (nonsustained ventricular tachycardia) (HCC)   Mixed hyperlipidemia    Discharge Instructions  Discharge Instructions    Call MD for:  extreme fatigue   Complete by: As directed    Call MD for:  persistant dizziness or light-headedness   Complete by: As  directed    Diet - low sodium heart healthy   Complete by: As directed    Increase activity slowly   Complete by: As directed      Allergies as of 08/02/2020   No Known Allergies     Medication List    TAKE these medications   donepezil 10 MG tablet Commonly known as: Aricept Take 1 tablet (10 mg total) by mouth at bedtime.   loperamide HCl 1 MG/7.5ML suspension Commonly known as: IMODIUM Take 15 mLs (2 mg total) by mouth every 4 (four) hours as needed for diarrhea or loose stools.   metoprolol succinate 25 MG 24 hr tablet Commonly known as: TOPROL-XL Take 0.5 tablets (12.5 mg total) by mouth daily.   multivitamin with minerals Tabs tablet Take 1 tablet by mouth daily.   PREVAGEN EXTRA STRENGTH PO Take 1 tablet by mouth daily.   rosuvastatin 5 MG tablet Commonly known as: Crestor Take 1 tablet (5 mg total) by mouth daily. What changed: when to take this       Follow-up Information    Tolia, Sunit, DO. Go on 08/23/2020.   Specialties: Cardiology, Radiology, Vascular Surgery Why: 10:30am  Contact information: [address] [place] [address] [phone]              No Known Allergies  Consultations: None  Procedures/Studies: CT ABDOMEN PELVIS WO CONTRAST  Result Date: 07/28/2020 CLINICAL DATA:  Abdominal distension.  Dry heaves.  Disorientation. EXAM: CT ABDOMEN AND PELVIS WITHOUT CONTRAST TECHNIQUE: Multidetector CT imaging of the abdomen and pelvis was performed following the standard protocol without IV  contrast. COMPARISON:  None. FINDINGS: Despite efforts by the technologist and patient, motion artifact is present on today's exam and could not be eliminated. This reduces exam sensitivity and specificity. Patient arm positioning by her sides also reduces signal to noise ratio. Lower chest: Descending thoracic aortic atherosclerosis. Hepatobiliary: Unremarkable Pancreas: Unremarkable Spleen: Unremarkable Adrenals/Urinary Tract: Foley  catheter in the urinary bladder. No hydronephrosis, hydroureter, or definite urinary tract calculi. Adrenal glands unremarkable. Stomach/Bowel: Sigmoid colon diverticulosis with some minimal hazy stranding adjacent to the sigmoid colon on image 71 of series 5 which could reflect early low-grade acute diverticulitis. No extraluminal gas or abscess. No dilated bowel. Vascular/Lymphatic: Unremarkable Reproductive: Unremarkable Other: No supplemental non-categorized findings. Musculoskeletal: Unremarkable IMPRESSION: 1. Sigmoid colon diverticulosis with some minimal hazy stranding adjacent to the sigmoid colon which could reflect early low-grade acute diverticulitis. No extraluminal gas or abscess. 2. Aortic atherosclerosis. Aortic Atherosclerosis (ICD10-I70.0). Electronically Signed   By: Gaylyn Rong M.D.   On: 07/28/2020 13:28   CT Head Wo Contrast  Result Date: 07/28/2020 CLINICAL DATA:  Mental status change. EXAM: CT HEAD WITHOUT CONTRAST TECHNIQUE: Contiguous axial images were obtained from the base of the skull through the vertex without intravenous contrast. COMPARISON:  01/03/2020 FINDINGS: Brain: There is no evidence of an acute infarct, intracranial hemorrhage, mass, midline shift, or extra-axial fluid collection. Cerebral atrophy is unchanged with preferential involvement of the mesial temporal lobes. Hypodensities in the cerebral white matter bilaterally are unchanged and nonspecific but compatible with mild chronic small vessel ischemic disease. Vascular: Calcified atherosclerosis at the skull base. No hyperdense vessel. Skull: No fracture suspicious osseous lesion. Sinuses/Orbits: Visualized paranasal sinuses and mastoid air cells are clear. Unremarkable orbits. Other: None. IMPRESSION: 1. No evidence of acute intracranial abnormality. 2. 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 Electronically Signed   By: Sebastian Ache M.D.   On: 07/28/2020 13:26   DG Chest Portable 1  View  Result Date: 07/28/2020 CLINICAL DATA:  Altered mental status. EXAM: PORTABLE CHEST 1 VIEW COMPARISON:  None. FINDINGS: Cardiomediastinal silhouette is normal. Mediastinal contours appear intact. There is no evidence of focal airspace consolidation, pleural effusion or pneumothorax. Osseous structures are without acute abnormality. Soft tissues are grossly normal. IMPRESSION: No active disease. Electronically Signed   By: Ted Mcalpine M.D.   On: 07/28/2020 12:03   ECHOCARDIOGRAM COMPLETE  Result Date: 07/29/2020    ECHOCARDIOGRAM REPORT   Patient Name:   Stephanie Acevedo Date of Exam: 07/29/2020 Medical Rec #:  [id number]    Height:       66.0 in Accession #:    [id number]   Weight:       101.0 lb Date of Birth:  March 30, 1946    BSA:          1.496 m Patient Age:    74 years     BP:           141/77 mmHg Patient Gender: F            HR:           78 bpm. [facility] Procedure: 2D Echo Indications:    Ventricular Tachycardia I47.2  History:        Patient has no prior history of Echocardiogram examinations.                 Risk Factors:Non-Smoker, Diabetes and Dyslipidemia. Torsades de                 pointes ,  Dementia without behavioral disturbance.  Sonographer:    Jeryl Columbia Referring Phys: [id number] SUNIT TOLIA IMPRESSIONS  1. Left ventricular ejection fraction, by estimation, is 50 to 55%. The left ventricle has low normal function. The left ventricle has no regional wall motion abnormalities. Left ventricular diastolic parameters are consistent with Grade II diastolic dysfunction (pseudonormalization).  2. Right ventricular systolic function is normal. The right ventricular size is normal.  3. The mitral valve is normal in structure. Moderate mitral valve regurgitation. No evidence of mitral stenosis.  4. The aortic valve is normal in structure. Aortic valve regurgitation is not visualized. No aortic stenosis is present.  5. The inferior vena cava is normal in size with greater  than 50% respiratory variability, suggesting right atrial pressure of 3 mmHg. FINDINGS  Left Ventricle: Left ventricular ejection fraction, by estimation, is 50 to 55%. The left ventricle has low normal function. The left ventricle has no regional wall motion abnormalities. The left ventricular internal cavity size was normal in size. There is no left ventricular hypertrophy. Left ventricular diastolic parameters are consistent with Grade II diastolic dysfunction (pseudonormalization). Right Ventricle: The right ventricular size is normal. No increase in right ventricular wall thickness. Right ventricular systolic function is normal. Left Atrium: Left atrial size was normal in size. Right Atrium: Right atrial size was normal in size. Pericardium: There is no evidence of pericardial effusion. Mitral Valve: The mitral valve is normal in structure. Moderate mitral valve regurgitation. No evidence of mitral valve stenosis. Tricuspid Valve: The tricuspid valve is normal in structure. Tricuspid valve regurgitation is mild . No evidence of tricuspid stenosis. Aortic Valve: The aortic valve is normal in structure. Aortic valve regurgitation is not visualized. No aortic stenosis is present. Pulmonic Valve: The pulmonic valve was normal in structure. Pulmonic valve regurgitation is not visualized. No evidence of pulmonic stenosis. Aorta: The aortic root is normal in size and structure. Venous: The inferior vena cava is normal in size with greater than 50% respiratory variability, suggesting right atrial pressure of 3 mmHg. IAS/Shunts: No atrial level shunt detected by color flow Doppler.  LEFT VENTRICLE PLAX 2D LVIDd:         4.50 cm  Diastology LVIDs:         3.30 cm  LV e' medial:    5.00 cm/s LV PW:         1.00 cm  LV E/e' medial:  21.0 LV IVS:        0.90 cm  LV e' lateral:   10.80 cm/s LVOT diam:     1.50 cm  LV E/e' lateral: 9.7 LVOT Area:     1.77 cm  RIGHT VENTRICLE RV S prime:     12.70 cm/s TAPSE (M-mode): 2.0 cm  LEFT ATRIUM             Index       RIGHT ATRIUM           Index LA diam:        2.50 cm 1.67 cm/m  RA Area:     10.20 cm LA Vol (A2C):   28.8 ml 19.25 ml/m RA Volume:   20.90 ml  13.97 ml/m LA Vol (A4C):   22.8 ml 15.24 ml/m LA Biplane Vol: 26.9 ml 17.98 ml/m   AORTA Ao Root diam: 2.50 cm MITRAL VALVE                TRICUSPID VALVE MV Area (PHT): 3.37 cm     TR Peak  grad:   18.3 mmHg MV Decel Time: 225 msec     TR Vmax:        214.00 cm/s MR Peak grad: 106.1 mmHg MR Mean grad: 73.0 mmHg     SHUNTS MR Vmax:      515.00 cm/s   Systemic Diam: 1.50 cm MR Vmean:     406.0 cm/s MV E velocity: 105.00 cm/s MV A velocity: 69.00 cm/s MV E/A ratio:  1.52 Donato Schultz MD Electronically signed by Donato Schultz MD Signature Date/Time: 07/29/2020/3:11:14 PM    Final       Subjective: No acute issues or events overnight, diarrhea continues to improve, decreased amount, frequency, and liquidy.  She otherwise denies chest pain, shortness of breath, nausea, vomiting, constipation, headache, fevers, chills.   Discharge Exam: Vitals:   08/01/20 2001 08/02/20 0356  BP: 128/72 118/60  Pulse: 66 62  Resp: 18 17  Temp: 98.2 F (36.8 C) 98.5 F (36.9 C)  SpO2: 97% 95%   Vitals:   08/01/20 1651 08/01/20 2001 08/02/20 0306 08/02/20 0356  BP: 122/69 128/72  118/60  Pulse: 60 66  62  Resp: Temp: 98.4 F (36.9 C) 98.2 F (36.8 C)  98.5 F (36.9 C)  TempSrc: Oral Oral  Oral  SpO2: 96% 97%  95%  Weight:   48.6 kg   Height:        General: Pt is alert, awake, not in acute distress Cardiovascular: RRR, S1/S2 +, no rubs, no gallops Respiratory: CTA bilaterally, no wheezing, no rhonchi Abdominal: Soft, NT, ND, bowel sounds + Extremities: no edema, no cyanosis    The results of significant diagnostics from this hospitalization (including imaging, microbiology, ancillary and laboratory) are listed below for reference.     Microbiology: Recent Results (from the past 240 hour(s))  Respiratory  Panel by RT PCR (Flu A&B, Covid) - Nasopharyngeal Swab     Status: None   Collection Time: 07/28/20 11:38 AM   Specimen: Nasopharyngeal Swab  Result Value Ref Range Status   SARS Coronavirus 2 by RT PCR NEGATIVE NEGATIVE Final    Comment: (NOTE) SARS-CoV-2 target nucleic acids are NOT DETECTED.  The SARS-CoV-2 RNA is generally detectable in upper respiratoy specimens during the acute phase of infection. The lowest concentration of SARS-CoV-2 viral copies this assay can detect is 131 copies/mL. A negative result does not preclude SARS-Cov-2 infection and should not be used as the sole basis for treatment or other patient management decisions. A negative result may occur with  improper specimen collection/handling, submission of specimen other than nasopharyngeal swab, presence of viral mutation(s) within the areas targeted by this assay, and inadequate number of viral copies (<131 copies/mL). A negative result must be combined with clinical observations, patient history, and epidemiological information. The expected result is Negative.  Fact Sheet for Patients:  https://www.moore.com/  Fact Sheet for Healthcare Providers:  https://www.young.biz/  This test is no t yet approved or cleared by the [place] FDA and  has been authorized for detection and/or diagnosis of SARS-CoV-2 by FDA under an Emergency Use Authorization (EUA). This EUA will remain  in effect (meaning this test can be used) for the duration of the COVID-19 declaration under Section 564(b)(1) of the Act, 21 U.S.C. section 360bbb-3(b)(1), unless the authorization is terminated or revoked sooner.     Influenza A by PCR NEGATIVE NEGATIVE Final   Influenza B by PCR NEGATIVE NEGATIVE Final    Comment: (NOTE) The Xpert Xpress SARS-CoV-2/FLU/RSV assay  is intended as an aid in  the diagnosis of influenza from Nasopharyngeal swab specimens and  should not be used as a sole basis  for treatment. Nasal washings and  aspirates are unacceptable for Xpert Xpress SARS-CoV-2/FLU/RSV  testing.  Fact Sheet for Patients: https://www.moore.com/https://www.fda.gov/media/142436/download  Fact Sheet for Healthcare Providers: https://www.young.biz/https://www.fda.gov/media/142435/download  This test is not yet approved or cleared by the [place] FDA and  has been authorized for detection and/or diagnosis of SARS-CoV-2 by  FDA under an Emergency Use Authorization (EUA). This EUA will remain  in effect (meaning this test can be used) for the duration of the  Covid-19 declaration under Section 564(b)(1) of the Act, 21  U.S.C. section 360bbb-3(b)(1), unless the authorization is  terminated or revoked. Performed at [facility], [address] [place], [place] [address]   Urine culture     Status: None   Collection Time: 07/28/20 12:36 PM   Specimen: Urine, Random  Result Value Ref Range Status   Specimen Description URINE, RANDOM  Final   Special Requests NONE  Final   Culture   Final    NO GROWTH Performed at [facility], [address] [place], [place] [address]    Report Status 07/29/2020 FINAL  Final  Blood culture (routine x 2)     Status: None (Preliminary result)   Collection Time: 07/28/20  1:37 PM   Specimen: BLOOD  Result Value Ref Range Status   Specimen Description BLOOD RIGHT ANTECUBITAL  Final   Special Requests   Final    AEROBIC BOTTLE ONLY Blood Culture results may not be optimal due to an inadequate volume of blood received in culture bottles   Culture   Final    NO GROWTH 4 DAYS Performed at [facility], [address] [place], [place] [address]    Report Status PENDING  Incomplete  Blood culture (routine x 2)     Status: None (Preliminary result)   Collection Time: 07/28/20  2:32 PM   Specimen: BLOOD  Result Value Ref Range Status   Specimen Description BLOOD SITE NOT SPECIFIED  Final   Special Requests   Final    BOTTLES DRAWN AEROBIC ONLY Blood Culture  results may not be optimal due to an inadequate volume of blood received in culture bottles   Culture   Final    NO GROWTH 4 DAYS Performed at [facility], [address] [place], [place] [address]    Report Status PENDING  Incomplete  MRSA PCR Screening     Status: None   Collection Time: 07/28/20  6:25 PM   Specimen: Nasopharyngeal  Result Value Ref Range Status   MRSA by PCR NEGATIVE NEGATIVE Final    Comment:        The GeneXpert MRSA Assay (FDA approved for NASAL specimens only), is one component of a comprehensive MRSA colonization surveillance program. It is not intended to diagnose MRSA infection nor to guide or monitor treatment for MRSA infections. Performed at [facility], [address] [place], [place] [address]   C Difficile Quick Screen w PCR reflex     Status: None   Collection Time: 07/30/20  6:48 PM   Specimen: STOOL  Result Value Ref Range Status   C Diff antigen NEGATIVE NEGATIVE Final   C Diff toxin NEGATIVE NEGATIVE Final   C Diff interpretation No C. difficile detected.  Final    Comment: Performed at [facility], [address] [place], [place] [address]  Gastrointestinal Panel by PCR , Stool     Status: None   Collection Time: 07/30/20  6:48 PM   Specimen: Stool  Result Value Ref Range Status   Campylobacter species NOT DETECTED NOT DETECTED Final   Plesimonas shigelloides NOT DETECTED NOT DETECTED Final   Salmonella species NOT DETECTED NOT DETECTED Final   Yersinia enterocolitica NOT DETECTED NOT DETECTED Final   Vibrio species NOT DETECTED NOT DETECTED Final   Vibrio cholerae NOT DETECTED NOT DETECTED Final   Enteroaggregative E coli (EAEC) NOT DETECTED NOT DETECTED Final   Enteropathogenic E coli (EPEC) NOT DETECTED NOT DETECTED Final   Enterotoxigenic E coli (ETEC) NOT DETECTED NOT DETECTED Final   Shiga like toxin producing E coli (STEC) NOT DETECTED NOT DETECTED Final   Shigella/Enteroinvasive E coli (EIEC) NOT  DETECTED NOT DETECTED Final   Cryptosporidium NOT DETECTED NOT DETECTED Final   Cyclospora cayetanensis NOT DETECTED NOT DETECTED Final   Entamoeba histolytica NOT DETECTED NOT DETECTED Final   Giardia lamblia NOT DETECTED NOT DETECTED Final   Adenovirus F40/41 NOT DETECTED NOT DETECTED Final   Astrovirus NOT DETECTED NOT DETECTED Final   Norovirus GI/GII NOT DETECTED NOT DETECTED Final   Rotavirus A NOT DETECTED NOT DETECTED Final   Sapovirus (I, II, IV, and V) NOT DETECTED NOT DETECTED Final    Comment: Performed at [facility], [address] [place], [place] [address]     Labs: BNP (last 3 results) No results for input(s): BNP in the last 8760 hours. Basic Metabolic Panel: Recent Labs  Lab 07/28/20 1135 07/28/20 1654 07/29/20 0059 07/29/20 0836 07/29/20 1517 07/30/20 0048 07/30/20 0428 07/30/20 0428 07/30/20 8295 07/30/20 1606 07/31/20 0257 08/01/20 0333 08/02/20 0121  NA  --    < > 141   < >  --    < > 143  --  144  --  138 139 141  K  --    < > 2.6*   < >  --    < > 3.2*   < > 3.1* 3.8 3.5 2.9* 4.0  CL  --    < > 106   < >  --    < > 110  --  107  --  105 105 107  CO2  --    < > 29   < >  --    < > 27  --  25  --  GLUCOSE  --    < > 169*   < >  --    < > 161*  --  159*  --  159* 125* 134*  BUN  --    < > 8   < >  --    < > <5*  --  <5*  --  6* 6* 8  CREATININE  --    < > 1.09*   < >  --    < > 0.82  --  0.86  --  0.87 0.96 0.92  CALCIUM  --    < > 8.1*   < >  --    < > 7.9*  --  7.8*  --  8.0* 8.0* 8.5*  MG 2.2  --  2.6*  --  2.2  --   --   --   --   --   --   --   --   PHOS  --   --  3.1  --   --   --   --   --   --   --   --   --   --    < > =  values in this interval not displayed.   Liver Function Tests: Recent Labs  Lab 07/28/20 0929  AST 25  ALT 23  ALKPHOS 51  BILITOT 0.6  PROT 6.9  ALBUMIN 3.6   No results for input(s): LIPASE, AMYLASE in the last 168 hours. No results for input(s): AMMONIA in the last 168 hours. CBC: Recent  Labs  Lab 07/28/20 0929 07/29/20 0102 08/01/20 0333 08/02/20 0121  WBC 11.0* 11.5* 8.5 8.6  HGB 11.7* 9.6* 9.1* 10.3*  HCT 34.5* 28.8* 27.5* 30.9*  MCV 88.5 88.3 88.7 88.0  PLT 262 196 196 221   Cardiac Enzymes: No results for input(s): CKTOTAL, CKMB, CKMBINDEX, TROPONINI in the last 168 hours. BNP: Invalid input(s): POCBNP CBG: Recent Labs  Lab 07/31/20 1143 07/31/20 1619 07/31/20 2119 08/01/20 0623 08/01/20 1132  GLUCAP 167* 149* 142* 129* 153*   D-Dimer No results for input(s): DDIMER in the last 72 hours. Hgb A1c No results for input(s): HGBA1C in the last 72 hours. Lipid Profile No results for input(s): CHOL, HDL, LDLCALC, TRIG, CHOLHDL, LDLDIRECT in the last 72 hours. Thyroid function studies No results for input(s): TSH, T4TOTAL, T3FREE, THYROIDAB in the last 72 hours.  Invalid input(s): FREET3 Anemia work up No results for input(s): VITAMINB12, FOLATE, FERRITIN, TIBC, IRON, RETICCTPCT in the last 72 hours. Urinalysis    Component Value Date/Time   COLORURINE YELLOW 07/28/2020 1236   APPEARANCEUR CLEAR 07/28/2020 1236   LABSPEC 1.011 07/28/2020 1236   PHURINE 7.0 07/28/2020 1236   GLUCOSEU NEGATIVE 07/28/2020 1236   HGBUR NEGATIVE 07/28/2020 1236   BILIRUBINUR NEGATIVE 07/28/2020 1236   KETONESUR 20 (A) 07/28/2020 1236   PROTEINUR 30 (A) 07/28/2020 1236   NITRITE NEGATIVE 07/28/2020 1236   LEUKOCYTESUR NEGATIVE 07/28/2020 1236   Sepsis Labs Invalid input(s): PROCALCITONIN,  WBC,  LACTICIDVEN Microbiology Recent Results (from the past 240 hour(s))  Respiratory Panel by RT PCR (Flu A&B, Covid) - Nasopharyngeal Swab     Status: None   Collection Time: 07/28/20 11:38 AM   Specimen: Nasopharyngeal Swab  Result Value Ref Range Status   SARS Coronavirus 2 by RT PCR NEGATIVE NEGATIVE Final    Comment: (NOTE) SARS-CoV-2 target nucleic acids are NOT DETECTED.  The SARS-CoV-2 RNA is generally detectable in upper respiratoy specimens during the acute phase  of infection. The lowest concentration of SARS-CoV-2 viral copies this assay can detect is 131 copies/mL. A negative result does not preclude SARS-Cov-2 infection and should not be used as the sole basis for treatment or other patient management decisions. A negative result may occur with  improper specimen collection/handling, submission of specimen other than nasopharyngeal swab, presence of viral mutation(s) within the areas targeted by this assay, and inadequate number of viral copies (<131 copies/mL). A negative result must be combined with clinical observations, patient history, and epidemiological information. The expected result is Negative.  Fact Sheet for Patients:  https://www.moore.com/  Fact Sheet for Healthcare Providers:  https://www.young.biz/  This test is no t yet approved or cleared by the [place] FDA and  has been authorized for detection and/or diagnosis of SARS-CoV-2 by FDA under an Emergency Use Authorization (EUA). This EUA will remain  in effect (meaning this test can be used) for the duration of the COVID-19 declaration under Section 564(b)(1) of the Act, 21 U.S.C. section 360bbb-3(b)(1), unless the authorization is terminated or revoked sooner.     Influenza A by PCR NEGATIVE NEGATIVE Final   Influenza B by PCR NEGATIVE NEGATIVE Final    Comment: (  NOTE) The Xpert Xpress SARS-CoV-2/FLU/RSV assay is intended as an aid in  the diagnosis of influenza from Nasopharyngeal swab specimens and  should not be used as a sole basis for treatment. Nasal washings and  aspirates are unacceptable for Xpert Xpress SARS-CoV-2/FLU/RSV  testing.  Fact Sheet for Patients: https://www.moore.com/  Fact Sheet for Healthcare Providers: https://www.young.biz/  This test is not yet approved or cleared by the [place] FDA and  has been authorized for detection and/or diagnosis of  SARS-CoV-2 by  FDA under an Emergency Use Authorization (EUA). This EUA will remain  in effect (meaning this test can be used) for the duration of the  Covid-19 declaration under Section 564(b)(1) of the Act, 21  U.S.C. section 360bbb-3(b)(1), unless the authorization is  terminated or revoked. Performed at [facility], [address] [place], [place] [address]   Urine culture     Status: None   Collection Time: 07/28/20 12:36 PM   Specimen: Urine, Random  Result Value Ref Range Status   Specimen Description URINE, RANDOM  Final   Special Requests NONE  Final   Culture   Final    NO GROWTH Performed at [facility], [address] [place], [place] [address]    Report Status 07/29/2020 FINAL  Final  Blood culture (routine x 2)     Status: None (Preliminary result)   Collection Time: 07/28/20  1:37 PM   Specimen: BLOOD  Result Value Ref Range Status   Specimen Description BLOOD RIGHT ANTECUBITAL  Final   Special Requests   Final    AEROBIC BOTTLE ONLY Blood Culture results may not be optimal due to an inadequate volume of blood received in culture bottles   Culture   Final    NO GROWTH 4 DAYS Performed at [facility], [address] [place], [place] [address]    Report Status PENDING  Incomplete  Blood culture (routine x 2)     Status: None (Preliminary result)   Collection Time: 07/28/20  2:32 PM   Specimen: BLOOD  Result Value Ref Range Status   Specimen Description BLOOD SITE NOT SPECIFIED  Final   Special Requests   Final    BOTTLES DRAWN AEROBIC ONLY Blood Culture results may not be optimal due to an inadequate volume of blood received in culture bottles   Culture   Final    NO GROWTH 4 DAYS Performed at [facility], [address] [place], [place] [address]    Report Status PENDING  Incomplete  MRSA PCR Screening     Status: None   Collection Time: 07/28/20  6:25 PM   Specimen: Nasopharyngeal  Result Value Ref Range Status   MRSA by  PCR NEGATIVE NEGATIVE Final    Comment:        The GeneXpert MRSA Assay (FDA approved for NASAL specimens only), is one component of a comprehensive MRSA colonization surveillance program. It is not intended to diagnose MRSA infection nor to guide or monitor treatment for MRSA infections. Performed at [facility], [address], [place], [place] [address]   C Difficile Quick Screen w PCR reflex     Status: None   Collection Time: 07/30/20  6:48 PM   Specimen: STOOL  Result Value Ref Range Status   C Diff antigen NEGATIVE NEGATIVE Final   C Diff toxin NEGATIVE NEGATIVE Final   C Diff interpretation No C. difficile detected.  Final    Comment: Performed  at [facility], [address] [place], [place] [address]  Gastrointestinal Panel by PCR , Stool     Status: None   Collection Time: 07/30/20  6:48 PM   Specimen: Stool  Result Value Ref Range Status   Campylobacter species NOT DETECTED NOT DETECTED Final   Plesimonas shigelloides NOT DETECTED NOT DETECTED Final   Salmonella species NOT DETECTED NOT DETECTED Final   Yersinia enterocolitica NOT DETECTED NOT DETECTED Final   Vibrio species NOT DETECTED NOT DETECTED Final   Vibrio cholerae NOT DETECTED NOT DETECTED Final   Enteroaggregative E coli (EAEC) NOT DETECTED NOT DETECTED Final   Enteropathogenic E coli (EPEC) NOT DETECTED NOT DETECTED Final   Enterotoxigenic E coli (ETEC) NOT DETECTED NOT DETECTED Final   Shiga like toxin producing E coli (STEC) NOT DETECTED NOT DETECTED Final   Shigella/Enteroinvasive E coli (EIEC) NOT DETECTED NOT DETECTED Final   Cryptosporidium NOT DETECTED NOT DETECTED Final   Cyclospora cayetanensis NOT DETECTED NOT DETECTED Final   Entamoeba histolytica NOT DETECTED NOT DETECTED Final   Giardia lamblia NOT DETECTED NOT DETECTED Final   Adenovirus F40/41 NOT DETECTED NOT DETECTED Final   Astrovirus NOT DETECTED NOT DETECTED Final   Norovirus GI/GII NOT DETECTED NOT DETECTED Final    Rotavirus A NOT DETECTED NOT DETECTED Final   Sapovirus (I, II, IV, and V) NOT DETECTED NOT DETECTED Final    Comment: Performed at [facility], [address] [place], [place] [address]     Time coordinating discharge: Over 30 minutes  SIGNED:   Azucena Fallen, DO [facility] 08/02/2020, 7:59 AM Pager   If 7PM-7AM, please contact night-coverage [email or web address]

## 2020-08-02 NOTE — Discharge Instructions (Signed)
Dehydration, Adult Dehydration is condition in which there is not enough water or other fluids in the body. This happens when a person loses more fluids than he or she takes in. Important body parts cannot work right without the right amount of fluids. Any loss of fluids from the body can cause dehydration. Dehydration can be mild, worse, or very bad. It should be treated right away to keep it from getting very bad. What are the causes? This condition may be caused by:  Conditions that cause loss of water or other fluids, such as: ? Watery poop (diarrhea). ? Vomiting. ? Sweating a lot. ? Peeing (urinating) a lot.  Not drinking enough fluids, especially when you: ? Are ill. ? Are doing things that take a lot of energy to do.  Other illnesses and conditions, such as fever or infection.  Certain medicines, such as medicines that take extra fluid out of the body (diuretics).  Lack of safe drinking water.  Not being able to get enough water and food. What increases the risk? The following factors may make you more likely to develop this condition:  Having a long-term (chronic) illness that has not been treated the right way, such as: ? Diabetes. ? Heart disease. ? Kidney disease.  Being 65 years of age or older.  Having a disability.  Living in a place that is high above the ground or sea (high in altitude). The thinner, dried air causes more fluid loss.  Doing exercises that put stress on your body for a long time. What are the signs or symptoms? Symptoms of dehydration depend on how bad it is. Mild or worse dehydration  Thirst.  Dry lips or dry mouth.  Feeling dizzy or light-headed, especially when you stand up from sitting.  Muscle cramps.  Your body making: ? Dark pee (urine). Pee may be the color of tea. ? Less pee than normal. ? Less tears than normal.  Headache. Very bad dehydration  Changes in skin. Skin may: ? Be cold to the touch (clammy). ? Be blotchy  or pale. ? Not go back to normal right after you lightly pinch it and let it go.  Little or no tears, pee, or sweat.  Changes in vital signs, such as: ? Fast breathing. ? Low blood pressure. ? Weak pulse. ? Pulse that is more than 100 beats a minute when you are sitting still.  Other changes, such as: ? Feeling very thirsty. ? Eyes that look hollow (sunken). ? Cold hands and feet. ? Being mixed up (confused). ? Being very tired (lethargic) or having trouble waking from sleep. ? Short-term weight loss. ? Loss of consciousness. How is this treated? Treatment for this condition depends on how bad it is. Treatment should start right away. Do not wait until your condition gets very bad. Very bad dehydration is an emergency. You will need to go to a hospital.  Mild or worse dehydration can be treated at home. You may be asked to: ? Drink more fluids. ? Drink an oral rehydration solution (ORS). This drink helps get the right amounts of fluids and salts and minerals in the blood (electrolytes).  Very bad dehydration can be treated: ? With fluids through an IV tube. ? By getting normal levels of salts and minerals in your blood. This is often done by giving salts and minerals through a tube. The tube is passed through your nose and into your stomach. ? By treating the root cause. Follow these instructions at   home: Oral rehydration solution If told by your doctor, drink an ORS:  Make an ORS. Use instructions on the package.  Start by drinking small amounts, about  cup (120 mL) every 5-10 minutes.  Slowly drink more until you have had the amount that your doctor said to have. Eating and drinking         Drink enough clear fluid to keep your pee pale yellow. If you were told to drink an ORS, finish the ORS first. Then, start slowly drinking other clear fluids. Drink fluids such as: ? Water. Do not drink only water. Doing that can make the salt (sodium) level in your body get too  low. ? Water from ice chips you suck on. ? Fruit juice that you have added water to (diluted). ? Low-calorie sports drinks.  Eat foods that have the right amounts of salts and minerals, such as: ? Bananas. ? Oranges. ? Potatoes. ? Tomatoes. ? Spinach.  Do not drink alcohol.  Avoid: ? Drinks that have a lot of sugar. These include:  High-calorie sports drinks.  Fruit juice that you did not add water to.  Soda.  Caffeine. ? Foods that are greasy or have a lot of fat or sugar. General instructions  Take over-the-counter and prescription medicines only as told by your doctor.  Do not take salt tablets. Doing that can make the salt level in your body get too high.  Return to your normal activities as told by your doctor. Ask your doctor what activities are safe for you.  Keep all follow-up visits as told by your doctor. This is important. Contact a doctor if:  You have pain in your belly (abdomen) and the pain: ? Gets worse. ? Stays in one place.  You have a rash.  You have a stiff neck.  You get angry or annoyed (irritable) more easily than normal.  You are more tired or have a harder time waking than normal.  You feel: ? Weak or dizzy. ? Very thirsty. Get help right away if you have:  Any symptoms of very bad dehydration.  Symptoms of vomiting, such as: ? You cannot eat or drink without vomiting. ? Your vomiting gets worse or does not go away. ? Your vomit has blood or green stuff in it.  Symptoms that get worse with treatment.  A fever.  A very bad headache.  Problems with peeing or pooping (having a bowel movement), such as: ? Watery poop that gets worse or does not go away. ? Blood in your poop (stool). This may cause poop to look black and tarry. ? Not peeing in 6-8 hours. ? Peeing only a small amount of very dark pee in 6-8 hours.  Trouble breathing. These symptoms may be an emergency. Do not wait to see if the symptoms will go away. Get  medical help right away. Call your local emergency services (911 in the [place] Do not drive yourself to the hospital. Summary  Dehydration is a condition in which there is not enough water or other fluids in the body. This happens when a person loses more fluids than he or she takes in.  Treatment for this condition depends on how bad it is. Treatment should be started right away. Do not wait until your condition gets very bad.  Drink enough clear fluid to keep your pee pale yellow. If you were told to drink an oral rehydration solution (ORS), finish the ORS first. Then, start slowly drinking other clear fluids.  Take over-the-counter and prescription medicines only as told by your doctor.  Get help right away if you have any symptoms of very bad dehydration. This information is not intended to replace advice given to you by your health care provider. Make sure you discuss any questions you have with your health care provider. Document Revised: 04/13/2019 Document Reviewed: 04/13/2019 Elsevier Patient Education  2020 [employer]   Dehydration, Adult Dehydration is condition in which there is not enough water or other fluids in the body. This happens when a person loses more fluids than he or she takes in. Important body parts cannot work right without the right amount of fluids. Any loss of fluids from the body can cause dehydration. Dehydration can be mild, worse, or very bad. It should be treated right away to keep it from getting very bad. What are the causes? This condition may be caused by:  Conditions that cause loss of water or other fluids, such as: ? Watery poop (diarrhea). ? Vomiting. ? Sweating a lot. ? Peeing (urinating) a lot.  Not drinking enough fluids, especially when you: ? Are ill. ? Are doing things that take a lot of energy to do.  Other illnesses and conditions, such as fever or infection.  Certain medicines, such as medicines that take extra fluid out of  the body (diuretics).  Lack of safe drinking water.  Not being able to get enough water and food. What increases the risk? The following factors may make you more likely to develop this condition:  Having a long-term (chronic) illness that has not been treated the right way, such as: ? Diabetes. ? Heart disease. ? Kidney disease.  Being 76 years of age or older.  Having a disability.  Living in a place that is high above the ground or sea (high in altitude). The thinner, dried air causes more fluid loss.  Doing exercises that put stress on your body for a long time. What are the signs or symptoms? Symptoms of dehydration depend on how bad it is. Mild or worse dehydration  Thirst.  Dry lips or dry mouth.  Feeling dizzy or light-headed, especially when you stand up from sitting.  Muscle cramps.  Your body making: ? Dark pee (urine). Pee may be the color of tea. ? Less pee than normal. ? Less tears than normal.  Headache. Very bad dehydration  Changes in skin. Skin may: ? Be cold to the touch (clammy). ? Be blotchy or pale. ? Not go back to normal right after you lightly pinch it and let it go.  Little or no tears, pee, or sweat.  Changes in vital signs, such as: ? Fast breathing. ? Low blood pressure. ? Weak pulse. ? Pulse that is more than 100 beats a minute when you are sitting still.  Other changes, such as: ? Feeling very thirsty. ? Eyes that look hollow (sunken). ? Cold hands and feet. ? Being mixed up (confused). ? Being very tired (lethargic) or having trouble waking from sleep. ? Short-term weight loss. ? Loss of consciousness. How is this treated? Treatment for this condition depends on how bad it is. Treatment should start right away. Do not wait until your condition gets very bad. Very bad dehydration is an emergency. You will need to go to a hospital.  Mild or worse dehydration can be treated at home. You may be asked to: ? Drink more  fluids. ? Drink an oral rehydration solution (ORS). This drink helps get the right  amounts of fluids and salts and minerals in the blood (electrolytes).  Very bad dehydration can be treated: ? With fluids through an IV tube. ? By getting normal levels of salts and minerals in your blood. This is often done by giving salts and minerals through a tube. The tube is passed through your nose and into your stomach. ? By treating the root cause. Follow these instructions at home: Oral rehydration solution If told by your doctor, drink an ORS:  Make an ORS. Use instructions on the package.  Start by drinking small amounts, about  cup (120 mL) every 5-10 minutes.  Slowly drink more until you have had the amount that your doctor said to have. Eating and drinking         Drink enough clear fluid to keep your pee pale yellow. If you were told to drink an ORS, finish the ORS first. Then, start slowly drinking other clear fluids. Drink fluids such as: ? Water. Do not drink only water. Doing that can make the salt (sodium) level in your body get too low. ? Water from ice chips you suck on. ? Fruit juice that you have added water to (diluted). ? Low-calorie sports drinks.  Eat foods that have the right amounts of salts and minerals, such as: ? Bananas. ? Oranges. ? Potatoes. ? Tomatoes. ? Spinach.  Do not drink alcohol.  Avoid: ? Drinks that have a lot of sugar. These include:  High-calorie sports drinks.  Fruit juice that you did not add water to.  Soda.  Caffeine. ? Foods that are greasy or have a lot of fat or sugar. General instructions  Take over-the-counter and prescription medicines only as told by your doctor.  Do not take salt tablets. Doing that can make the salt level in your body get too high.  Return to your normal activities as told by your doctor. Ask your doctor what activities are safe for you.  Keep all follow-up visits as told by your doctor. This is  important. Contact a doctor if:  You have pain in your belly (abdomen) and the pain: ? Gets worse. ? Stays in one place.  You have a rash.  You have a stiff neck.  You get angry or annoyed (irritable) more easily than normal.  You are more tired or have a harder time waking than normal.  You feel: ? Weak or dizzy. ? Very thirsty. Get help right away if you have:  Any symptoms of very bad dehydration.  Symptoms of vomiting, such as: ? You cannot eat or drink without vomiting. ? Your vomiting gets worse or does not go away. ? Your vomit has blood or green stuff in it.  Symptoms that get worse with treatment.  A fever.  A very bad headache.  Problems with peeing or pooping (having a bowel movement), such as: ? Watery poop that gets worse or does not go away. ? Blood in your poop (stool). This may cause poop to look black and tarry. ? Not peeing in 6-8 hours. ? Peeing only a small amount of very dark pee in 6-8 hours.  Trouble breathing. These symptoms may be an emergency. Do not wait to see if the symptoms will go away. Get medical help right away. Call your local emergency services (911 in the [place] Do not drive yourself to the hospital. Summary  Dehydration is a condition in which there is not enough water or other fluids in the body. This happens when a person  loses more fluids than he or she takes in.  Treatment for this condition depends on how bad it is. Treatment should be started right away. Do not wait until your condition gets very bad.  Drink enough clear fluid to keep your pee pale yellow. If you were told to drink an oral rehydration solution (ORS), finish the ORS first. Then, start slowly drinking other clear fluids.  Take over-the-counter and prescription medicines only as told by your doctor.  Get help right away if you have any symptoms of very bad dehydration. This information is not intended to replace advice given to you by your health care  provider. Make sure you discuss any questions you have with your health care provider. Document Revised: 04/13/2019 Document Reviewed: 04/13/2019 Elsevier Patient Education  2020 [employer]   Dehydration, Elderly  Dehydration is condition in which there is not enough water or other fluids in the body. This happens when a person loses more fluids than he or she takes in. Important body parts cannot work right without the right amount of fluids. Any loss of fluids from the body can cause dehydration. People 73 years of age or older have a higher risk of dehydration than younger adults. This is because in older age, the body:  Is less able to keep the right amount of water.  Does not respond to temperature changes as well.  Does not get a sense of thirst as easily or quickly. Dehydration can be mild, worse, or very bad. It should be treated right away to keep it from getting very bad. What are the causes? This condition may be caused by:  Conditions that cause loss of water or other fluids, such as: ? Watery poop (diarrhea). ? Vomiting. ? Sweating a lot. ? Peeing (urinating) a lot.  Not drinking enough fluids, especially when you: ? Are ill. ? Are doing things that take a lot of energy to do.  Other illnesses and conditions, such as fever or infection.  Certain medicines, such as medicines that take extra fluid out of the body (diuretics).  Lack of safe drinking water.  Not being able to get enough water and food. What increases the risk? The following factors may make you more likely to develop this condition:  Having a long-term (chronic) illness that has not been treated the right way, such as: ? An illness that may cause you to pee more, such as diabetes. ? Kidney, heart, or lung disease. ? A condition such as dementia. This affects:  The brain and nervous system.  Thinking.  Feelings.  Being 73 years of age or older.  Having a disability.  Living in a place  that is high above the ground or sea (high in altitude). The thinner, drier air causes more fluid loss. What are the signs or symptoms? Symptoms of dehydration depend on how bad it is. Mild or worse dehydration  Thirst.  Dry lips or dry mouth.  Feeling dizzy or light-headed, especially when you stand up from sitting.  Muscle cramps.  Your body making: ? Dark pee (urine). Pee may be the color of tea. ? Less pee than normal. ? Less tears than normal.  Headache. Very bad dehydration  Changes in skin. Skin may: ? Be cold to the touch (clammy). ? Be blotchy or pale. ? Not go back to normal right after you lightly pinch it and let it go.  Little or no tears, pee, or sweat.  Changes in vital signs, such as: ?  Fast breathing. ? Low blood pressure. ? Weak pulse. ? Pulse that is more than 100 beats a minute when you are sitting still.  Other changes, such as: ? Feeling very thirsty. ? Eyes that look hollow (sunken). ? Cold hands and feet. ? Being mixed up (confused). ? Being very tired (lethargic) or having trouble waking from sleep. ? Short-term weight loss. ? Loss of consciousness. How is this treated? Treatment for this condition depends on how bad it is. Treatment should start right away. Do not wait until your condition gets very bad. Very bad dehydration is an emergency. You will need to go to a hospital.  Mild or worse dehydration can be treated at home. You may be asked to: ? Drink more fluids. ? Drink an oral rehydration solution (ORS). This drink helps get the right amounts of fluids and salts and minerals in the blood (electrolytes).  Very bad dehydration can be treated: ? With fluids through an IV tube. ? By getting normal levels of salts and minerals in your blood. This is often done by giving salts and minerals through a tube. The tube is passed through your nose and into your stomach. ? By treating the root cause. Follow these instructions at home: Oral  rehydration solution If told by your doctor, drink an ORS:  Make an ORS. Use instructions on the package.  Start by drinking small amounts, about  cup (120 mL) every 5-10 minutes.  Slowly drink more until you have had the amount that your doctor said to have. Eating and drinking         Drink enough clear fluid to keep your pee pale yellow. If you were told to drink an ORS, finish the ORS first. Then, start slowly drinking other clear fluids. Drink fluids such as: ? Water. Do not drink only water. Doing that can make the salt (sodium) level in your body get too low. ? Water from ice chips you suck on. ? Fruit juice that you have added water to (diluted). ? Low-calorie sports drinks.  Eat foods that have the right amounts of salts and minerals, such as: ? Bananas. ? Oranges. ? Potatoes. ? Tomatoes. ? Spinach.  Do not drink alcohol.  Avoid: ? Drinks that have a lot of sugar. These include:  High-calorie sports drinks.  Fruit juice that you did not add water to.  Soda.  Caffeine. ? Foods that are greasy or have a lot of fat or sugar. General instructions  Take over-the-counter and prescription medicines only as told by your doctor.  Do not take salt tablets. Doing that can make the salt level in your body get too high.  Return to your normal activities as told by your doctor. Ask your doctor what activities are safe for you.  Keep all follow-up visits as told by your doctor. This is important. Contact a doctor if:  You have pain in your belly (abdomen) and the pain: ? Gets worse. ? Stays in one place.  You have a rash.  You have a stiff neck.  You get angry or annoyed (irritable) more easily than normal.  You are more tired or have a harder time waking than normal.  You feel: ? Weak or dizzy. ? Very thirsty. Get help right away if you have:  Any symptoms of very bad dehydration.  A fever.  A very bad headache.  Symptoms of vomiting, such  as: ? Your vomiting gets worse or does not go away. ? Your vomit has  blood or green stuff in it. ? You cannot eat or drink without vomiting.  Problems with peeing or pooping (having a bowel movement), such as: ? Watery poop that gets worse or does not go away. ? Blood in your poop (stool). This may cause poop to look black and tarry. ? Not peeing in 6-8 hours. ? Peeing only a small amount of very dark pee in 6-8 hours.  Trouble breathing.  Symptoms that get worse with treatment. These symptoms may be an emergency. Do not wait to see if the symptoms will go away. Get medical help right away. Call your local emergency services (911 in the [place] Do not drive yourself to the hospital. Summary  Dehydration is a condition in which there is not enough water or other fluids in the body. This happens when a person loses more fluids than he or she takes in.  Treatment for this condition depends on how bad it is. Treatment should be started right away. Do not wait until your condition gets very bad.  Drink enough clear fluid to keep your pee pale yellow. If you were told to drink an oral rehydration solution (ORS), finish the ORS first. Then, start slowly drinking other clear fluids.  Take over-the-counter and prescription medicines only as told by your doctor.  Get help right away if you have any symptoms of very bad dehydration. This information is not intended to replace advice given to you by your health care provider. Make sure you discuss any questions you have with your health care provider. Document Revised: 04/13/2019 Document Reviewed: 04/13/2019 Elsevier Patient Education  2020 [employer].

## 2020-08-02 NOTE — Progress Notes (Signed)
Patient alert and oriented to person only, which is her baseline. Lungs remain clear throughout, resps even and unlabored. Bowel sounds hyperactive in all quads. Patient has had two BMs today, second one quite small. One dose of PRN loperamide given; patient and family stated it seems to help. Patient is more steady on her feet today and has not had difficulty getting to [facility] for toileting needs. IV site to left FA noted to be slightly pink and indurated proximally this morning but no warmth or tenderness in the area. Catheter removed intact since patient is planning to go home today as well. Patient denies discomfort at the area. She did receive of potassium via this PIV yesterday. Instructed spouse on what to look for and what to do should area get worse. Discharge instructions given, reviewed upcomoing lab and physician appointments with spouse as well as new prescriptions needing to be picked up from [facility] on [address]; he voiced understanding. Patient will likely leave after eating lunch, per spouse.

## 2020-08-05 ENCOUNTER — Telehealth: Payer: Self-pay

## 2020-08-05 ENCOUNTER — Encounter: Payer: Self-pay | Admitting: Internal Medicine

## 2020-08-05 ENCOUNTER — Other Ambulatory Visit: Payer: Self-pay

## 2020-08-05 ENCOUNTER — Ambulatory Visit (INDEPENDENT_AMBULATORY_CARE_PROVIDER_SITE_OTHER): Payer: Medicare Other | Admitting: Internal Medicine

## 2020-08-05 VITALS — BP 140/90 | HR 58 | Temp 97.8°F | Ht 66.0 in | Wt 104.2 lb

## 2020-08-05 DIAGNOSIS — F0390 Unspecified dementia without behavioral disturbance: Secondary | ICD-10-CM

## 2020-08-05 DIAGNOSIS — I4721 Torsades de pointes: Secondary | ICD-10-CM

## 2020-08-05 DIAGNOSIS — E1142 Type 2 diabetes mellitus with diabetic polyneuropathy: Secondary | ICD-10-CM

## 2020-08-05 DIAGNOSIS — E1169 Type 2 diabetes mellitus with other specified complication: Secondary | ICD-10-CM

## 2020-08-05 DIAGNOSIS — I472 Ventricular tachycardia: Secondary | ICD-10-CM

## 2020-08-05 DIAGNOSIS — I4729 Other ventricular tachycardia: Secondary | ICD-10-CM

## 2020-08-05 DIAGNOSIS — E876 Hypokalemia: Secondary | ICD-10-CM

## 2020-08-05 DIAGNOSIS — R197 Diarrhea, unspecified: Secondary | ICD-10-CM | POA: Diagnosis not present

## 2020-08-05 DIAGNOSIS — E785 Hyperlipidemia, unspecified: Secondary | ICD-10-CM | POA: Diagnosis not present

## 2020-08-05 DIAGNOSIS — R110 Nausea: Secondary | ICD-10-CM

## 2020-08-05 DIAGNOSIS — R634 Abnormal weight loss: Secondary | ICD-10-CM

## 2020-08-05 HISTORY — DX: Hypomagnesemia: E83.42

## 2020-08-05 MED ORDER — ONDANSETRON HCL 4 MG PO TABS: 4.0000 mg | ORAL_TABLET | Freq: Three times a day (TID) | ORAL | 0 refills | Status: DC | PRN

## 2020-08-05 MED ORDER — ROSUVASTATIN CALCIUM 5 MG PO TABS: 5.0000 mg | ORAL_TABLET | Freq: Every day | ORAL | 3 refills | Status: DC

## 2020-08-05 NOTE — Telephone Encounter (Signed)
Location of hospitalization: [facility] Reason for hospitalization: Disoriented, fatigued Date of discharge: Friday, 08/02/2020 Date of first communication with patient: today Person contacting patient: Me Current symptoms: none Do you understand why you were in the Hospital: Yes Questions regarding discharge instructions: None Where were you discharged to: Home Medications reviewed: Yes Allergies reviewed: Yes Dietary changes reviewed: No dietary restricitions Referals reviewed: NA Activities of Daily Living: Able to with mild limitations Any transportation issues/concerns: None Any patient concerns: None Confirmed importance & date/time of Follow up appt: Yes Confirmed with patient if condition begins to worsen call. Pt was given the office number and encouraged to call back with questions or concerns: Yes

## 2020-08-05 NOTE — Progress Notes (Signed)
Location:  [facility] clinic Provider: Reynald Woods L. Renato Gails, D.O., C.M.D.  Code Status: No CPR or intubation, defibrillation and ACLS meds ok  Goals of Care:  Advanced Directives 08/05/2020  Does Patient Have a Medical Advance Directive? Yes  Type of Estate agent of [place];Living will  Does patient want to make changes to medical advance directive? No - Patient declined  Copy of Healthcare Power of Attorney in Chart? Yes - validated most recent copy scanned in chart (See row information)  Would patient like information on creating a medical advance directive? -     Chief Complaint  Patient presents with  . Hospitalization Follow-up    Discharge date 08/02/2020  . Acute Visit    Patient has not been taking medications for 2 days because of the possible side effects which is diarrhea. Husband would like potassium level check today because it was low in the hospital.      HPI: Patient is a 74 y.o. female seen today for hospital follow-up s/p admission from  Last sun am, when [facility] got up, she was unstable on her feet and was not making sense.  He took her to the ED.  She had a heart episode while she was being evaluated.  Her potassium was critically low.  They worked all week to get it up to 4.  She'd been having a lot of diarrhea.  He thinks that had been going on a couple of weeks, but he's not sure.  He wanted the hospital to keep her long to see if the K was staying normal.    Mr. Koone stopped all her meds last night and thought he'd let me decide what was causing the problem.  This am, she's gagging like she wants to vomit.    She was in [place] with torsade de pointes in the ED. K was 2.   Mag was 2.3.    She ate well at the hospital.  She was eating a good breakfast and dinner at home also until this morning.  He has to force her to eat.  She'd been down to 100 lbs before the hospital.    She had two doses of imodium yesterday.  She does not tell him when she uses  the bathroom.  She does not think she had loose stool this morning.    She's now at 104.2 lbs.  Had been 107 lbs in July.  Past Medical History:  Diagnosis Date  . Dementia (HCC)   . Diabetes mellitus without complication (HCC)   . Hyperlipidemia   . Hypokalemia   . Torsades de pointes (HCC)   . Ventricular tachycardia, non-sustained (HCC)     History reviewed. No pertinent surgical history.  No Known Allergies  Outpatient Encounter Medications as of 08/05/2020  Medication Sig  . Apoaequorin (PREVAGEN EXTRA STRENGTH PO) Take 1 tablet by mouth daily.   Marland Kitchen donepezil (ARICEPT) 10 MG tablet Take 1 tablet (10 mg total) by mouth at bedtime.  Marland Kitchen loperamide HCl (IMODIUM) 1 MG/7.5ML suspension Take 15 mLs (2 mg total) by mouth every 4 (four) hours as needed for diarrhea or loose stools.  . metoprolol succinate (TOPROL-XL) 25 MG 24 hr tablet Take 0.5 tablets (12.5 mg total) by mouth daily.  . Multiple Vitamin (MULTIVITAMIN WITH MINERALS) TABS tablet Take 1 tablet by mouth daily.  . rosuvastatin (CRESTOR) 5 MG tablet Take 1 tablet (5 mg total) by mouth daily.   No facility-administered encounter medications on file as of 08/05/2020.  Review of Systems:  Review of Systems  Constitutional: Positive for malaise/fatigue and weight loss. Negative for chills and fever.  HENT: Negative for congestion and sore throat.   Eyes: Negative for blurred vision.  Respiratory: Negative for cough and shortness of breath.   Cardiovascular: Negative for chest pain, palpitations and leg swelling.  Gastrointestinal: Positive for diarrhea and nausea. Negative for abdominal pain, blood in stool, constipation, melena and vomiting.  Genitourinary: Negative for dysuria.  Musculoskeletal: Negative for falls and joint pain.  Neurological: Negative for dizziness and loss of consciousness.  Endo/Heme/Allergies: Bruises/bleeds easily.  Psychiatric/Behavioral: Positive for memory loss.       Wandering around the  house more, but does not try to leave, sometimes happens at night at which point she goes into the bathroom    Health Maintenance  Topic Date Due  . OPHTHALMOLOGY EXAM  Never done  . INFLUENZA VACCINE  Never done  . FOOT EXAM  03/15/2021 (Originally 06/04/1956)  . MAMMOGRAM  04/04/2021 (Originally 06/04/1997)  . DEXA SCAN  04/04/2021 (Originally 06/04/2012)  . COLONOSCOPY  04/04/2021 (Originally 06/04/1997)  . TETANUS/TDAP  04/04/2021 (Originally 06/04/1966)  . HEMOGLOBIN A1C  09/25/2020  . URINE MICROALBUMIN  01/21/2021  . PNA vac Low Risk Adult (2 of 2 - PPSV23) 04/04/2021  . COVID-19 Vaccine  Completed  . Hepatitis C Screening  Completed    Physical Exam: Vitals:   08/05/20 0858  BP: 140/90  Pulse: (!) 58  Temp: 97.8 F (36.6 C)  TempSrc: Temporal  SpO2: 95%  Weight: 104 lb 3.2 oz (47.3 kg)  Height: 5\' 6"  (1.676 m)   Body mass index is 16.82 kg/m. Physical Exam Vitals reviewed.  Constitutional:      Appearance: Normal appearance.  HENT:     Head: Normocephalic and atraumatic.  Eyes:     Extraocular Movements: Extraocular movements intact.     Conjunctiva/sclera: Conjunctivae normal.     Pupils: Pupils are equal, round, and reactive to light.  Cardiovascular:     Rate and Rhythm: Normal rate and regular rhythm.     Pulses: Normal pulses.     Heart sounds: Normal heart sounds.  Pulmonary:     Effort: Pulmonary effort is normal.     Breath sounds: Normal breath sounds. No wheezing, rhonchi or rales.  Abdominal:     General: Bowel sounds are normal. There is no distension.     Palpations: Abdomen is soft.     Tenderness: There is no abdominal tenderness. There is no guarding or rebound.  Musculoskeletal:        General: Normal range of motion.     Right lower leg: No edema.     Left lower leg: No edema.  Neurological:     Mental Status: She is alert.     Comments: Frail, cachectic female, but able to get up out of chair w/o use of arms, not oriented, could not  find restroom on her own  Psychiatric:        Mood and Affect: Mood normal.     Labs reviewed: Basic Metabolic Panel: Recent Labs    11/27/19 1540 12/11/19 1144 07/28/20 1135 07/28/20 1654 07/29/20 0059 07/29/20 0836 07/29/20 1517 07/30/20 0048 07/31/20 0257 08/01/20 0333 08/02/20 0121  NA 142   < >  --    < > 141   < >  --    < > 138 139 141  K 3.4*   < >  --    < > 2.6*   < >  --    < >  3.5 2.9* 4.0  CL 104   < >  --    < > 106   < >  --    < > 105 105 107  CO2 26   < >  --    < > 29   < >  --    < > 26 28 25   GLUCOSE 140*   < >  --    < > 169*   < >  --    < > 159* 125* 134*  BUN 17   < >  --    < > 8   < >  --    < > 6* 6* 8  CREATININE 0.79   < >  --    < > 1.09*   < >  --    < > 0.87 0.96 0.92  CALCIUM 9.7   < >  --    < > 8.1*   < >  --    < > 8.0* 8.0* 8.5*  MG  --   --  2.2  --  2.6*  --  2.2  --   --   --   --   PHOS  --   --   --   --  3.1  --   --   --   --   --   --   TSH 1.17  --   --   --   --   --   --   --   --   --   --    < > = values in this interval not displayed.   Liver Function Tests: Recent Labs    11/27/19 1540 07/28/20 0929  AST 17 25  ALT 11 23  ALKPHOS  --  51  BILITOT 0.3 0.6  PROT 7.3 6.9  ALBUMIN  --  3.6   No results for input(s): LIPASE, AMYLASE in the last 8760 hours. No results for input(s): AMMONIA in the last 8760 hours. CBC: Recent Labs    11/27/19 1540 07/28/20 0929 07/29/20 0102 08/01/20 0333 08/02/20 0121  WBC 9.4   < > 11.5* 8.5 8.6  NEUTROABS 6,401  --   --   --   --   HGB 13.1   < > 9.6* 9.1* 10.3*  HCT 38.3   < > 28.8* 27.5* 30.9*  MCV 87.0   < > 88.3 88.7 88.0  PLT 332   < > 196 196 221   < > = values in this interval not displayed.   Lipid Panel: Recent Labs    03/25/20 0846  CHOL 233*  HDL 64  LDLCALC 135*  TRIG 202*  CHOLHDL 3.6   Lab Results  Component Value Date   HGBA1C 6.5 (H) 03/25/2020    Procedures since last visit: CT ABDOMEN PELVIS WO CONTRAST  Result Date: 07/28/2020 CLINICAL  DATA:  Abdominal distension.  Dry heaves.  Disorientation. EXAM: CT ABDOMEN AND PELVIS WITHOUT CONTRAST TECHNIQUE: Multidetector CT imaging of the abdomen and pelvis was performed following the standard protocol without IV contrast. COMPARISON:  None. FINDINGS: Despite efforts by the technologist and patient, motion artifact is present on today's exam and could not be eliminated. This reduces exam sensitivity and specificity. Patient arm positioning by her sides also reduces signal to noise ratio. Lower chest: Descending thoracic aortic atherosclerosis. Hepatobiliary: Unremarkable Pancreas: Unremarkable Spleen: Unremarkable Adrenals/Urinary Tract: Foley catheter in the urinary bladder. No hydronephrosis, hydroureter, or definite urinary tract  calculi. Adrenal glands unremarkable. Stomach/Bowel: Sigmoid colon diverticulosis with some minimal hazy stranding adjacent to the sigmoid colon on image 71 of series 5 which could reflect early low-grade acute diverticulitis. No extraluminal gas or abscess. No dilated bowel. Vascular/Lymphatic: Unremarkable Reproductive: Unremarkable Other: No supplemental non-categorized findings. Musculoskeletal: Unremarkable IMPRESSION: 1. Sigmoid colon diverticulosis with some minimal hazy stranding adjacent to the sigmoid colon which could reflect early low-grade acute diverticulitis. No extraluminal gas or abscess. 2. Aortic atherosclerosis. Aortic Atherosclerosis (ICD10-I70.0). Electronically Signed   By: Gaylyn Rong M.D.   On: 07/28/2020 13:28   CT Head Wo Contrast  Result Date: 07/28/2020 CLINICAL DATA:  Mental status change. EXAM: CT HEAD WITHOUT CONTRAST TECHNIQUE: Contiguous axial images were obtained from the base of the skull through the vertex without intravenous contrast. COMPARISON:  01/03/2020 FINDINGS: Brain: There is no evidence of an acute infarct, intracranial hemorrhage, mass, midline shift, or extra-axial fluid collection. Cerebral atrophy is unchanged with  preferential involvement of the mesial temporal lobes. Hypodensities in the cerebral white matter bilaterally are unchanged and nonspecific but compatible with mild chronic small vessel ischemic disease. Vascular: Calcified atherosclerosis at the skull base. No hyperdense vessel. Skull: No fracture suspicious osseous lesion. Sinuses/Orbits: Visualized paranasal sinuses and mastoid air cells are clear. Unremarkable orbits. Other: None. IMPRESSION: 1. No evidence of acute intracranial abnormality. 2. 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 Electronically Signed   By: Sebastian Ache M.D.   On: 07/28/2020 13:26   DG Chest Portable 1 View  Result Date: 07/28/2020 CLINICAL DATA:  Altered mental status. EXAM: PORTABLE CHEST 1 VIEW COMPARISON:  None. FINDINGS: Cardiomediastinal silhouette is normal. Mediastinal contours appear intact. There is no evidence of focal airspace consolidation, pleural effusion or pneumothorax. Osseous structures are without acute abnormality. Soft tissues are grossly normal. IMPRESSION: No active disease. Electronically Signed   By: Ted Mcalpine M.D.   On: 07/28/2020 12:03   ECHOCARDIOGRAM COMPLETE  Result Date: 07/29/2020    ECHOCARDIOGRAM REPORT   Patient Name:   [facility] Date of Exam: 07/29/2020 Medical Rec #:  [id number]    Height:       66.0 in Accession #:    [id number]   Weight:       101.0 lb Date of Birth:  1946-05-22    BSA:          1.496 m Patient Age:    74 years     BP:           141/77 mmHg Patient Gender: F            HR:           78 bpm. [facility] Procedure: 2D Echo Indications:    Ventricular Tachycardia I47.2  History:        Patient has no prior history of Echocardiogram examinations.                 Risk Factors:Non-Smoker, Diabetes and Dyslipidemia. Torsades de                 pointes , Dementia without behavioral disturbance.  Sonographer:    Jeryl Columbia Referring Phys: [id number] SUNIT TOLIA IMPRESSIONS  1. Left  ventricular ejection fraction, by estimation, is 50 to 55%. The left ventricle has low normal function. The left ventricle has no regional wall motion abnormalities. Left ventricular diastolic parameters are consistent with Grade II diastolic dysfunction (pseudonormalization).  2. Right ventricular systolic function is normal. The right ventricular  size is normal.  3. The mitral valve is normal in structure. Moderate mitral valve regurgitation. No evidence of mitral stenosis.  4. The aortic valve is normal in structure. Aortic valve regurgitation is not visualized. No aortic stenosis is present.  5. The inferior vena cava is normal in size with greater than 50% respiratory variability, suggesting right atrial pressure of 3 mmHg. FINDINGS  Left Ventricle: Left ventricular ejection fraction, by estimation, is 50 to 55%. The left ventricle has low normal function. The left ventricle has no regional wall motion abnormalities. The left ventricular internal cavity size was normal in size. There is no left ventricular hypertrophy. Left ventricular diastolic parameters are consistent with Grade II diastolic dysfunction (pseudonormalization). Right Ventricle: The right ventricular size is normal. No increase in right ventricular wall thickness. Right ventricular systolic function is normal. Left Atrium: Left atrial size was normal in size. Right Atrium: Right atrial size was normal in size. Pericardium: There is no evidence of pericardial effusion. Mitral Valve: The mitral valve is normal in structure. Moderate mitral valve regurgitation. No evidence of mitral valve stenosis. Tricuspid Valve: The tricuspid valve is normal in structure. Tricuspid valve regurgitation is mild . No evidence of tricuspid stenosis. Aortic Valve: The aortic valve is normal in structure. Aortic valve regurgitation is not visualized. No aortic stenosis is present. Pulmonic Valve: The pulmonic valve was normal in structure. Pulmonic valve  regurgitation is not visualized. No evidence of pulmonic stenosis. Aorta: The aortic root is normal in size and structure. Venous: The inferior vena cava is normal in size with greater than 50% respiratory variability, suggesting right atrial pressure of 3 mmHg. IAS/Shunts: No atrial level shunt detected by color flow Doppler.  LEFT VENTRICLE PLAX 2D LVIDd:         4.50 cm  Diastology LVIDs:         3.30 cm  LV e' medial:    5.00 cm/s LV PW:         1.00 cm  LV E/e' medial:  21.0 LV IVS:        0.90 cm  LV e' lateral:   10.80 cm/s LVOT diam:     1.50 cm  LV E/e' lateral: 9.7 LVOT Area:     1.77 cm  RIGHT VENTRICLE RV S prime:     12.70 cm/s TAPSE (M-mode): 2.0 cm LEFT ATRIUM             Index       RIGHT ATRIUM           Index LA diam:        2.50 cm 1.67 cm/m  RA Area:     10.20 cm LA Vol (A2C):   28.8 ml 19.25 ml/m RA Volume:   20.90 ml  13.97 ml/m LA Vol (A4C):   22.8 ml 15.24 ml/m LA Biplane Vol: 26.9 ml 17.98 ml/m   AORTA Ao Root diam: 2.50 cm MITRAL VALVE                TRICUSPID VALVE MV Area (PHT): 3.37 cm     TR Peak grad:   18.3 mmHg MV Decel Time: 225 msec     TR Vmax:        214.00 cm/s MR Peak grad: 106.1 mmHg MR Mean grad: 73.0 mmHg     SHUNTS MR Vmax:      515.00 cm/s   Systemic Diam: 1.50 cm MR Vmean:     406.0 cm/s MV E velocity: 105.00 cm/s MV  A velocity: 69.00 cm/s MV E/A ratio:  1.52 Donato SchultzMark Skains MD Electronically signed by Donato SchultzMark Skains MD Signature Date/Time: 07/29/2020/3:11:14 PM    Final     Assessment/Plan 1. Diarrhea, unspecified type -suspect due to aricept, possibly prevagen -stop those (does not appear to be benefiting and already held at hospital) -cont to monitor and may use imodium -asked Mr. Luan PullingRich to monitor bms and track them  2. Torsades de pointes (HCC) - as in #3 - f/u electrolytes - Basic metabolic panel - Magnesium  3. NSVT (nonsustained ventricular tachycardia) (HCC) - in context of severe hypokalemia and hypomagnesemia--none since -emphasized importance  of low dose beta blocker to keep this under control - Basic metabolic panel - Magnesium  4. Hyperlipidemia associated with type 2 diabetes mellitus (HCC) - rosuvastatin (CRESTOR) 5 MG tablet; Take 1 tablet (5 mg total) by mouth daily. HOLD RIGHT NOW Dispense: 90 tablet; Refill: 3 - Lipid panel  5. Controlled type 2 diabetes mellitus with diabetic polyneuropathy, without long-term current use of insulin (HCC) - CBGs mid 100s  -advised for Mr. To check her only if intake is poor to avoid sticking her more than necessary - Hemoglobin A1c  6. Dementia without behavioral disturbance, unspecified dementia type (HCC) -progressing with more wandering and now hallucinations and delusion that she's a child--nothing scary or stressful for her -Mr. Is to let me know if he needs more assistance at home with her -she's struggling to eat and he's having to offer a lot of encouragement to her  7. Weight loss -continues to lose weight--is up a little after fluids and intake at hospital, but still down from July and has never been a big eater -again, stop aricept  8. Hypokalemia - recheck lab - Basic metabolic panel  9. Hypomagnesemia -recheck lab  10. Nausea - ? Due to aricept and or prevagen -ondansetron (ZOFRAN) 4 MG tablet; Take 1 tablet (4 mg total) by mouth every 8 (eight) hours as needed for nausea or vomiting.  Dispense: 30 tablet; Refill: 0  Labs/tests ordered:  Labs ordered for today instead, but keep lab appt pre next visit in case she needs them repeated if loose stools continue and they're still abnormal  Next appt:  08/19/2020  Chanze Teagle L. Kalan Yeley, D.O. Geriatrics [facility] [address] [address] [place], [place] [address] Cell Phone (Mon-Fri 8am-5pm):  [phone] On Call:  [phone] & follow prompts after 5pm & weekends Office Phone:  [phone] Office Fax:  [phone]

## 2020-08-05 NOTE — Telephone Encounter (Signed)
Prior authorization request received from [facility] for Ondansetron HCL 4 mg tablet. Prior authorization started through Cover my meds.

## 2020-08-05 NOTE — Patient Instructions (Addendum)
Stop aricept and prevagen.   Hold crestor for now. Cont imodium for diarrhea. Continue bananas and oranges for potassium Continue some gatorade as tolerated. I've sent some zofran for nausea.

## 2020-08-06 ENCOUNTER — Other Ambulatory Visit: Payer: Self-pay | Admitting: Internal Medicine

## 2020-08-06 DIAGNOSIS — E876 Hypokalemia: Secondary | ICD-10-CM

## 2020-08-06 LAB — BASIC METABOLIC PANEL
BUN: 14 mg/dL (ref 7–25)
CO2: 26 mmol/L (ref 20–32)
Calcium: 9.4 mg/dL (ref 8.6–10.4)
Chloride: 104 mmol/L (ref 98–110)
Creat: 0.79 mg/dL (ref 0.60–0.93)
Glucose, Bld: 118 mg/dL — ABNORMAL HIGH (ref 65–99)
Potassium: 4.1 mmol/L (ref 3.5–5.3)
Sodium: 141 mmol/L (ref 135–146)

## 2020-08-06 LAB — LIPID PANEL
Cholesterol: 198 mg/dL (ref <200)
HDL: 64 mg/dL (ref >=50)
LDL Cholesterol (Calc): 111 mg/dL (calc) — ABNORMAL HIGH
Non-HDL Cholesterol (Calc): 134 mg/dL (calc) — ABNORMAL HIGH (ref <130)
Total CHOL/HDL Ratio: 3.1 (calc) (ref <5.0)
Triglycerides: 122 mg/dL (ref <150)

## 2020-08-06 LAB — HEMOGLOBIN A1C
Hgb A1c MFr Bld: 6.5 % of total Hgb — ABNORMAL HIGH (ref <5.7)
Mean Plasma Glucose: 140 (calc)
eAG (mmol/L): 7.7 (calc)

## 2020-08-06 LAB — MAGNESIUM: Magnesium: 2.4 mg/dL (ref 1.5–2.5)

## 2020-08-06 NOTE — Progress Notes (Signed)
Kidney function looks better with more hydration recently Potassium and magnesium are still normal which is great.   Sugar average is stable at 6.5 which is good. Cholesterol has gotten a lot better with crestor. If her bowels and stomach settle down (diarrhea and nausea resolve), then restart the crestor.

## 2020-08-19 ENCOUNTER — Other Ambulatory Visit: Payer: Medicare Other

## 2020-08-19 ENCOUNTER — Other Ambulatory Visit: Payer: Self-pay

## 2020-08-19 DIAGNOSIS — E876 Hypokalemia: Secondary | ICD-10-CM | POA: Diagnosis not present

## 2020-08-20 LAB — BASIC METABOLIC PANEL
BUN: 23 mg/dL (ref 7–25)
CO2: 24 mmol/L (ref 20–32)
Calcium: 9.5 mg/dL (ref 8.6–10.4)
Chloride: 107 mmol/L (ref 98–110)
Creat: 0.85 mg/dL (ref 0.60–0.93)
Glucose, Bld: 139 mg/dL — ABNORMAL HIGH (ref 65–99)
Potassium: 4 mmol/L (ref 3.5–5.3)
Sodium: 141 mmol/L (ref 135–146)

## 2020-08-20 LAB — MAGNESIUM: Magnesium: 2.2 mg/dL (ref 1.5–2.5)

## 2020-08-20 NOTE — Progress Notes (Signed)
Electrolytes look good now.  Continue things the same way.

## 2020-08-22 ENCOUNTER — Other Ambulatory Visit: Payer: Self-pay

## 2020-08-22 ENCOUNTER — Ambulatory Visit (INDEPENDENT_AMBULATORY_CARE_PROVIDER_SITE_OTHER): Payer: Medicare Other | Admitting: Internal Medicine

## 2020-08-22 ENCOUNTER — Encounter: Payer: Self-pay | Admitting: Internal Medicine

## 2020-08-22 VITALS — BP 118/58 | HR 58 | Temp 97.8°F | Ht 66.0 in | Wt 105.2 lb

## 2020-08-22 DIAGNOSIS — E785 Hyperlipidemia, unspecified: Secondary | ICD-10-CM

## 2020-08-22 DIAGNOSIS — I4721 Torsades de pointes: Secondary | ICD-10-CM

## 2020-08-22 DIAGNOSIS — I4729 Other ventricular tachycardia: Secondary | ICD-10-CM

## 2020-08-22 DIAGNOSIS — R636 Underweight: Secondary | ICD-10-CM

## 2020-08-22 DIAGNOSIS — E1169 Type 2 diabetes mellitus with other specified complication: Secondary | ICD-10-CM | POA: Diagnosis not present

## 2020-08-22 DIAGNOSIS — F0390 Unspecified dementia without behavioral disturbance: Secondary | ICD-10-CM

## 2020-08-22 DIAGNOSIS — Z681 Body mass index (BMI) 19 or less, adult: Secondary | ICD-10-CM | POA: Diagnosis not present

## 2020-08-22 DIAGNOSIS — I472 Ventricular tachycardia: Secondary | ICD-10-CM

## 2020-08-22 DIAGNOSIS — E1142 Type 2 diabetes mellitus with diabetic polyneuropathy: Secondary | ICD-10-CM

## 2020-08-22 MED ORDER — ROSUVASTATIN CALCIUM 5 MG PO TABS: 5.0000 mg | ORAL_TABLET | Freq: Every day | ORAL | 3 refills | Status: DC

## 2020-08-22 NOTE — Progress Notes (Signed)
Location:  [facility] clinic Provider:  Reeva Davern L. Renato Gails, D.O., C.M.D.  Goals of Care:  Advanced Directives 08/22/2020  Does Patient Have a Medical Advance Directive? Yes  Type of Estate agent of [place];Living will  Does patient want to make changes to medical advance directive? No - Patient declined  Copy of Healthcare Power of Attorney in Chart? Yes - validated most recent copy scanned in chart (See row information)  Would patient like information on creating a medical advance directive? -   Chief Complaint  Patient presents with  . Quality Metric Gaps    HPI: Patient is a 74 y.o. female seen today for medical management of chronic diseases.    She has a cardiology appt tomorrow due to the nonsustained VT.    I suggested we restart the crestor therapy now that she is eating and no longer having nausea, vomiting and electrolytes normalized.  They've opted not to pursue her diabetic eye exam.  She is seeing well and her dementia is moderate.  She wanders through the house, pacing.  She still goes to bed early and does ok.  She did have a restless night and got up at 3am ready to come here this morning.    She is eating better.  No longer having diarrhea.    Past Medical History:  Diagnosis Date  . Dementia (HCC)   . Diabetes mellitus without complication (HCC)   . Hyperlipidemia   . Hypokalemia   . Torsades de pointes (HCC)   . Ventricular tachycardia, non-sustained (HCC)    History reviewed. No pertinent surgical history.  No Known Allergies  Outpatient Encounter Medications as of 08/22/2020  Medication Sig  . metoprolol succinate (TOPROL-XL) 25 MG 24 hr tablet Take 0.5 tablets (12.5 mg total) by mouth daily.  . Multiple Vitamin (MULTIVITAMIN WITH MINERALS) TABS tablet Take 1 tablet by mouth daily.  . ondansetron (ZOFRAN) 4 MG tablet Take 1 tablet (4 mg total) by mouth every 8 (eight) hours as needed for nausea or vomiting.  . [DISCONTINUED] loperamide  HCl (IMODIUM) 1 MG/7.5ML suspension Take 15 mLs (2 mg total) by mouth every 4 (four) hours as needed for diarrhea or loose stools.  . [DISCONTINUED] rosuvastatin (CRESTOR) 5 MG tablet Take 1 tablet (5 mg total) by mouth daily. HOLD   No facility-administered encounter medications on file as of 08/22/2020.    Review of Systems:  Review of Systems  Constitutional: Positive for malaise/fatigue. Negative for chills, fever and weight loss.  HENT: Negative for congestion and sore throat.   Eyes: Negative for blurred vision.  Respiratory: Negative for cough and shortness of breath.   Cardiovascular: Negative for chest pain, palpitations and leg swelling.  Gastrointestinal: Negative for abdominal pain, constipation, diarrhea, nausea and vomiting.  Genitourinary: Negative for dysuria.  Musculoskeletal: Negative for falls and joint pain.  Skin: Negative for itching and rash.  Neurological: Negative for dizziness and loss of consciousness.  Psychiatric/Behavioral: Positive for memory loss. Negative for depression. The patient is not nervous/anxious and does not have insomnia.        Has mood swings    Health Maintenance  Topic Date Due  . OPHTHALMOLOGY EXAM  Never done  . INFLUENZA VACCINE  Never done  . COVID-19 Vaccine (3 - Booster for Pfizer series) 08/14/2020  . FOOT EXAM  03/15/2021 (Originally 06/04/1956)  . MAMMOGRAM  04/04/2021 (Originally 06/04/1997)  . DEXA SCAN  04/04/2021 (Originally 06/04/2012)  . COLONOSCOPY  04/04/2021 (Originally 06/04/1997)  . TETANUS/TDAP  04/04/2021 (Originally 06/04/1966)  . URINE MICROALBUMIN  01/21/2021  . HEMOGLOBIN A1C  02/02/2021  . PNA vac Low Risk Adult (2 of 2 - PPSV23) 04/04/2021  . Hepatitis C Screening  Completed    Physical Exam: Vitals:   08/22/20 0835  BP: (!) 118/58  Pulse: (!) 58  Temp: 97.8 F (36.6 C)  TempSrc: Temporal  SpO2: 98%  Weight: 105 lb 3.2 oz (47.7 kg)  Height: 5\' 6"  (1.676 m)   Body mass index is 16.98  kg/m. Physical Exam Vitals reviewed.  Constitutional:      Appearance: Normal appearance.  Eyes:     Conjunctiva/sclera: Conjunctivae normal.  Cardiovascular:     Rate and Rhythm: Normal rate and regular rhythm.     Pulses: Normal pulses.     Heart sounds: Normal heart sounds.  Pulmonary:     Effort: Pulmonary effort is normal.     Breath sounds: Normal breath sounds. No wheezing, rhonchi or rales.  Abdominal:     General: Bowel sounds are normal.  Musculoskeletal:     Right lower leg: No edema.     Left lower leg: No edema.  Neurological:     General: No focal deficit present.     Mental Status: She is alert.     Gait: Gait abnormal.     Comments: Stiffness of knee  Psychiatric:        Mood and Affect: Mood normal.     Labs reviewed: Basic Metabolic Panel: Recent Labs    11/27/19 1540 12/11/19 1144 07/29/20 0059 07/29/20 0836 07/29/20 1517 07/30/20 0048 08/02/20 0121 08/05/20 0000 08/19/20 0804  NA 142   < > 141   < >  --    < > 141 141 141  K 3.4*   < > 2.6*   < >  --    < > 4.0 4.1 4.0  CL 104   < > 106   < >  --    < > 107 104 107  CO2 26   < > 29   < >  --    < > 25 26 24   GLUCOSE 140*   < > 169*   < >  --    < > 134* 118* 139*  BUN 17   < > 8   < >  --    < > 8 14 23   CREATININE 0.79   < > 1.09*   < >  --    < > 0.92 0.79 0.85  CALCIUM 9.7   < > 8.1*   < >  --    < > 8.5* 9.4 9.5  MG  --    < > 2.6*  --  2.2  --   --  2.4 2.2  PHOS  --   --  3.1  --   --   --   --   --   --   TSH 1.17  --   --   --   --   --   --   --   --    < > = values in this interval not displayed.   Liver Function Tests: Recent Labs    11/27/19 1540 07/28/20 0929  AST 17 25  ALT 11 23  ALKPHOS  --  51  BILITOT 0.3 0.6  PROT 7.3 6.9  ALBUMIN  --  3.6   No results for input(s): LIPASE, AMYLASE in the last 8760 hours. No results for input(s):  AMMONIA in the last 8760 hours. CBC: Recent Labs    11/27/19 1540 07/28/20 0929 07/29/20 0102 08/01/20 0333 08/02/20 0121   WBC 9.4   < > 11.5* 8.5 8.6  NEUTROABS 6,401  --   --   --   --   HGB 13.1   < > 9.6* 9.1* 10.3*  HCT 38.3   < > 28.8* 27.5* 30.9*  MCV 87.0   < > 88.3 88.7 88.0  PLT 332   < > 196 196 221   < > = values in this interval not displayed.   Lipid Panel: Recent Labs    03/25/20 0846 08/05/20 0000  CHOL 233* 198  HDL 64 64  LDLCALC 135* 111*  TRIG 202* 122  CHOLHDL 3.6 3.1   Lab Results  Component Value Date   HGBA1C 6.5 (H) 08/05/2020    Procedures since last visit: CT ABDOMEN PELVIS WO CONTRAST  Result Date: 07/28/2020 CLINICAL DATA:  Abdominal distension.  Dry heaves.  Disorientation. EXAM: CT ABDOMEN AND PELVIS WITHOUT CONTRAST TECHNIQUE: Multidetector CT imaging of the abdomen and pelvis was performed following the standard protocol without IV contrast. COMPARISON:  None. FINDINGS: Despite efforts by the technologist and patient, motion artifact is present on today's exam and could not be eliminated. This reduces exam sensitivity and specificity. Patient arm positioning by her sides also reduces signal to noise ratio. Lower chest: Descending thoracic aortic atherosclerosis. Hepatobiliary: Unremarkable Pancreas: Unremarkable Spleen: Unremarkable Adrenals/Urinary Tract: Foley catheter in the urinary bladder. No hydronephrosis, hydroureter, or definite urinary tract calculi. Adrenal glands unremarkable. Stomach/Bowel: Sigmoid colon diverticulosis with some minimal hazy stranding adjacent to the sigmoid colon on image 71 of series 5 which could reflect early low-grade acute diverticulitis. No extraluminal gas or abscess. No dilated bowel. Vascular/Lymphatic: Unremarkable Reproductive: Unremarkable Other: No supplemental non-categorized findings. Musculoskeletal: Unremarkable IMPRESSION: 1. Sigmoid colon diverticulosis with some minimal hazy stranding adjacent to the sigmoid colon which could reflect early low-grade acute diverticulitis. No extraluminal gas or abscess. 2. Aortic  atherosclerosis. Aortic Atherosclerosis (ICD10-I70.0). Electronically Signed   By: Gaylyn Rong M.D.   On: 07/28/2020 13:28   CT Head Wo Contrast  Result Date: 07/28/2020 CLINICAL DATA:  Mental status change. EXAM: CT HEAD WITHOUT CONTRAST TECHNIQUE: Contiguous axial images were obtained from the base of the skull through the vertex without intravenous contrast. COMPARISON:  01/03/2020 FINDINGS: Brain: There is no evidence of an acute infarct, intracranial hemorrhage, mass, midline shift, or extra-axial fluid collection. Cerebral atrophy is unchanged with preferential involvement of the mesial temporal lobes. Hypodensities in the cerebral white matter bilaterally are unchanged and nonspecific but compatible with mild chronic small vessel ischemic disease. Vascular: Calcified atherosclerosis at the skull base. No hyperdense vessel. Skull: No fracture suspicious osseous lesion. Sinuses/Orbits: Visualized paranasal sinuses and mastoid air cells are clear. Unremarkable orbits. Other: None. IMPRESSION: 1. No evidence of acute intracranial abnormality. 2. 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 Electronically Signed   By: Sebastian Ache M.D.   On: 07/28/2020 13:26   DG Chest Portable 1 View  Result Date: 07/28/2020 CLINICAL DATA:  Altered mental status. EXAM: PORTABLE CHEST 1 VIEW COMPARISON:  None. FINDINGS: Cardiomediastinal silhouette is normal. Mediastinal contours appear intact. There is no evidence of focal airspace consolidation, pleural effusion or pneumothorax. Osseous structures are without acute abnormality. Soft tissues are grossly normal. IMPRESSION: No active disease. Electronically Signed   By: Ted Mcalpine M.D.   On: 07/28/2020 12:03   ECHOCARDIOGRAM COMPLETE  Result Date:  07/29/2020    ECHOCARDIOGRAM REPORT   Patient Name:   Jackson [place] Date of Exam: 07/29/2020 Medical Rec #:  [id number]    Height:       66.0 in Accession #:    [id number]   Weight:        101.0 lb Date of Birth:  [birth year]    BSA:          1.496 m Patient Age:    74 years     BP:           141/77 mmHg Patient Gender: F            HR:           78 bpm. [facility] Procedure: 2D Echo Indications:    Ventricular Tachycardia I47.2  History:        Patient has no prior history of Echocardiogram examinations.                 Risk Factors:Non-Smoker, Diabetes and Dyslipidemia. Torsades de                 pointes , Dementia without behavioral disturbance.  Sonographer:    Jeryl Columbia Referring Phys: [id number] SUNIT TOLIA IMPRESSIONS  1. Left ventricular ejection fraction, by estimation, is 50 to 55%. The left ventricle has low normal function. The left ventricle has no regional wall motion abnormalities. Left ventricular diastolic parameters are consistent with Grade II diastolic dysfunction (pseudonormalization).  2. Right ventricular systolic function is normal. The right ventricular size is normal.  3. The mitral valve is normal in structure. Moderate mitral valve regurgitation. No evidence of mitral stenosis.  4. The aortic valve is normal in structure. Aortic valve regurgitation is not visualized. No aortic stenosis is present.  5. The inferior vena cava is normal in size with greater than 50% respiratory variability, suggesting right atrial pressure of 3 mmHg. FINDINGS  Left Ventricle: Left ventricular ejection fraction, by estimation, is 50 to 55%. The left ventricle has low normal function. The left ventricle has no regional wall motion abnormalities. The left ventricular internal cavity size was normal in size. There is no left ventricular hypertrophy. Left ventricular diastolic parameters are consistent with Grade II diastolic dysfunction (pseudonormalization). Right Ventricle: The right ventricular size is normal. No increase in right ventricular wall thickness. Right ventricular systolic function is normal. Left Atrium: Left atrial size was normal in size. Right Atrium: Right  atrial size was normal in size. Pericardium: There is no evidence of pericardial effusion. Mitral Valve: The mitral valve is normal in structure. Moderate mitral valve regurgitation. No evidence of mitral valve stenosis. Tricuspid Valve: The tricuspid valve is normal in structure. Tricuspid valve regurgitation is mild . No evidence of tricuspid stenosis. Aortic Valve: The aortic valve is normal in structure. Aortic valve regurgitation is not visualized. No aortic stenosis is present. Pulmonic Valve: The pulmonic valve was normal in structure. Pulmonic valve regurgitation is not visualized. No evidence of pulmonic stenosis. Aorta: The aortic root is normal in size and structure. Venous: The inferior vena cava is normal in size with greater than 50% respiratory variability, suggesting right atrial pressure of 3 mmHg. IAS/Shunts: No atrial level shunt detected by color flow Doppler.  LEFT VENTRICLE PLAX 2D LVIDd:         4.50 cm  Diastology LVIDs:         3.30 cm  LV e' medial:    5.00 cm/s LV PW:  1.00 cm  LV E/e' medial:  21.0 LV IVS:        0.90 cm  LV e' lateral:   10.80 cm/s LVOT diam:     1.50 cm  LV E/e' lateral: 9.7 LVOT Area:     1.77 cm  RIGHT VENTRICLE RV S prime:     12.70 cm/s TAPSE (M-mode): 2.0 cm LEFT ATRIUM             Index       RIGHT ATRIUM           Index LA diam:        2.50 cm 1.67 cm/m  RA Area:     10.20 cm LA Vol (A2C):   28.8 ml 19.25 ml/m RA Volume:   20.90 ml  13.97 ml/m LA Vol (A4C):   22.8 ml 15.24 ml/m LA Biplane Vol: 26.9 ml 17.98 ml/m   AORTA Ao Root diam: 2.50 cm MITRAL VALVE                TRICUSPID VALVE MV Area (PHT): 3.37 cm     TR Peak grad:   18.3 mmHg MV Decel Time: 225 msec     TR Vmax:        214.00 cm/s MR Peak grad: 106.1 mmHg MR Mean grad: 73.0 mmHg     SHUNTS MR Vmax:      515.00 cm/s   Systemic Diam: 1.50 cm MR Vmean:     406.0 cm/s MV E velocity: 105.00 cm/s MV A velocity: 69.00 cm/s MV E/A ratio:  1.52 Donato Schultz MD Electronically signed by Donato Schultz  MD Signature Date/Time: 07/29/2020/3:11:14 PM    Final     Assessment/Plan 1. Hyperlipidemia associated with type 2 diabetes mellitus (HCC) - will restart crestor (had been held due to her n/v/d) - rosuvastatin (CRESTOR) 5 MG tablet; Take 1 tablet (5 mg total) by mouth daily.  Dispense: 90 tablet; Refill: 3  2. Dementia without behavioral disturbance, unspecified dementia type (HCC) -progressing, is in bad mood today after she had to wait for a short time -lives at home with her husband  3. Underweight -gained a little back now that eating again and no longer having diarrhea  4. Body mass index (BMI) of 19 or less in adult -remains low, her husband has been educated about some higher protein and calorie foods to help her get into normal BMI range  5. Torsades de pointes (HCC) -noted when she had severe hypomagnesemia and hypokalemia -has cardio eval tomorrow -NOTE that pt has moderate dementia and we are limited in the amount of medication she can take and the invasive studies she will tolerate  6. NSVT (nonsustained ventricular tachycardia) (HCC) -see #5, noted during hospitalization   7. Controlled type 2 diabetes mellitus with diabetic polyneuropathy, without long-term current use of insulin (HCC) -has been controlled Lab Results  Component Value Date   HGBA1C 6.5 (H) 08/05/2020   Next appt:  11/21/2020 med mgt, labs same day--hba1c, bmp and flp  Talesha Ellithorpe L. Kaula Klenke, D.O. Geriatrics [facility] [address], [place] [address] Cell Phone (Mon-Fri 8am-5pm):  [phone] On Call:  [phone] & follow prompts after 5pm & weekends Office Phone:  [phone] Office Fax:  [phone]

## 2020-08-23 ENCOUNTER — Ambulatory Visit: Payer: Medicare Other | Admitting: Cardiology

## 2020-08-23 ENCOUNTER — Encounter: Payer: Self-pay | Admitting: Cardiology

## 2020-08-23 VITALS — BP 113/52 | HR 59 | Resp 16 | Ht 66.0 in | Wt 105.6 lb

## 2020-08-23 DIAGNOSIS — I472 Ventricular tachycardia: Secondary | ICD-10-CM | POA: Diagnosis not present

## 2020-08-23 DIAGNOSIS — I4729 Other ventricular tachycardia: Secondary | ICD-10-CM

## 2020-08-23 DIAGNOSIS — R636 Underweight: Secondary | ICD-10-CM | POA: Diagnosis not present

## 2020-08-23 DIAGNOSIS — Z09 Encounter for follow-up examination after completed treatment for conditions other than malignant neoplasm: Secondary | ICD-10-CM | POA: Diagnosis not present

## 2020-08-23 DIAGNOSIS — E782 Mixed hyperlipidemia: Secondary | ICD-10-CM

## 2020-08-23 DIAGNOSIS — I4721 Torsades de pointes: Secondary | ICD-10-CM

## 2020-08-23 NOTE — Progress Notes (Signed)
Stephanie CoombeBeverly Acevedo Date of Birth: 01/22/1946 MRN: [id number] Primary Care Provider:Reed, Gwenith Spitziffany L, DO Primary Cardiologist: Tessa LernerSunit Rillie Riffel, DO, [facility] (established care 07/28/2020)  Date: 08/23/20 Last Visit: 07/30/2020  Chief Complaint  Patient presents with  . NSVT  . Hospitalization Follow-up    Low potassium     HPI  Stephanie Acevedo is a 74 y.o.  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  Patient is accompanied by her husband Stephanie Acevedo at today's office visit.  Of note, patient is not the best historian given her underlying dementia.  Patient was initially evaluated in the hospital in November 2021 as she presented with torsades de pointe due to electrolyte abnormalities such as hypokalemia.  During her recent hospitalization patient was started on IV amiodarone given the burden of NSVT and torsades.  As electrolyte abnormalities were being corrected IV amiodarone was discontinued and she did not have recurrence of NSVT or torsades during her hospitalization.  Since NSVT/torsades can be due to underlying ischemic substrate as well they were recommended to undergo an ischemic evaluation prior to discharge.  However, patient's husband decided against it given her age, frailty, and treating the underlying cause of her hypokalemia.  They now presents for outpatient follow-up.  Since discharge on August 02, 2020 patient is doing well from a cardiovascular standpoint.  She has not had any recurrence of NSVT or needing rehospitalization.  Patient denies any chest pain or shortness of breath at rest or with effort related activities.  Independently reviewed the most recent blood work from 08/19/2020 which notes a potassium level of 4.0.    ALLERGIES: No Known Allergies   MEDICATION LIST PRIOR TO  VISIT: Current Outpatient Medications on File Prior to Visit  Medication Sig Dispense Refill  . metoprolol succinate (TOPROL-XL) 25 MG 24 hr tablet Take 0.5 tablets (12.5 mg total) by mouth daily. 30 tablet 0  . Multiple Vitamin (MULTIVITAMIN WITH MINERALS) TABS tablet Take 1 tablet by mouth daily.    . ondansetron (ZOFRAN) 4 MG tablet Take 1 tablet (4 mg total) by mouth every 8 (eight) hours as needed for nausea or vomiting. 30 tablet 0  . rosuvastatin (CRESTOR) 5 MG tablet Take 1 tablet (5 mg total) by mouth daily. 90 tablet 3   No current facility-administered medications on file prior to visit.    PAST MEDICAL HISTORY: Past Medical History:  Diagnosis Date  . Dementia (HCC)   . Diabetes mellitus without complication (HCC)   . Hyperlipidemia   . Hypokalemia   . Torsades de pointes (HCC)   . Ventricular tachycardia, non-sustained (HCC)     PAST SURGICAL HISTORY: History reviewed. No pertinent surgical history. None   FAMILY HISTORY: The patient's family history includes Dementia (age of onset: 1485) in her mother.   SOCIAL HISTORY:  The patient  reports that she has never smoked. She has never used smokeless tobacco. She reports that she does not drink alcohol and does not use drugs.  Review of Systems  Constitutional: Negative for chills and fever.  HENT: Negative for hoarse voice and nosebleeds.   Eyes: Negative for discharge, double vision and pain.  Cardiovascular: Negative for chest pain, claudication, dyspnea on exertion, leg swelling, near-syncope, orthopnea, palpitations, paroxysmal nocturnal dyspnea and syncope.  Respiratory: Negative for hemoptysis and shortness of breath.   Musculoskeletal: Negative for muscle cramps and  myalgias.  Gastrointestinal: Negative for abdominal pain, constipation, diarrhea, hematemesis, hematochezia, melena, nausea and vomiting.  Neurological: Negative for dizziness and light-headedness.       Mild cognitive impairment (I.e.dementia)      PHYSICAL EXAM: Vitals with BMI 08/23/2020 08/22/2020 08/05/2020  Height 5\' 6"  5\' 6"  5\' 6"   Weight 105 lbs 10 oz 105 lbs 3 oz 104 lbs 3 oz  BMI 17.05 16.99 16.83  Systolic 113 118  Diastolic 52 58 90  Pulse 59 58 58    CONSTITUTIONAL: Age-appropriate but frail female, hemodynamically stable, no acute distress.    SKIN: Skin is warm and dry. No rash noted. No cyanosis. No pallor. No jaundice HEAD: Normocephalic and atraumatic.  EYES: No scleral icterus MOUTH/THROAT: Moist oral membranes.  NECK: No JVD present. No thyromegaly noted. No carotid bruits  LYMPHATIC: No visible cervical adenopathy.  CHEST Normal respiratory effort. No intercostal retractions  LUNGS: Clear to auscultation bilaterally.  No stridor. No wheezes. No rales.  CARDIOVASCULAR: Regular rate and rhythm, positive S1-S2, soft holosystolic murmur heard at the apex, no gallops or rubs. ABDOMINAL: No apparent ascites.  EXTREMITIES: No peripheral edema  HEMATOLOGIC: No significant bruising NEUROLOGIC: Oriented to person, place, and time. Nonfocal. Normal muscle tone.  PSYCHIATRIC: Normal mood and affect. Normal behavior. Cooperative  CARDIAC DATABASE: EKG: 08/23/2020: Normal sinus rhythm, 61 bpm, poor only progression, without underlying ischemia or injury pattern.    Echocardiogram: 07/29/2020 1. Left ventricular ejection fraction, by estimation, is 50 to 55%. The left ventricle has low normal function. The left ventricle has no regional wall motion abnormalities. Left ventricular diastolic parameters are consistent with Grade II diastolic dysfunction (pseudonormalization). 2. Right ventricular systolic function is normal. The right ventricular size is normal. 3. The mitral valve is normal in structure. Moderate mitral valve regurgitation. No evidence of mitral stenosis. 4. The aortic valve is normal in structure. Aortic valve regurgitation is not visualized. No aortic stenosis is present. 5. The inferior  vena cava is normal in size with greater than 50% respiratory variability, suggesting right atrial pressure of 3 mmHg.   Stress Testing:  No results found for this or any previous visit from the past 1095 days.  Heart Catheterization: None  LABORATORY DATA: CBC Latest Ref Rng & Units 08/02/2020 08/01/2020 07/29/2020  WBC 4.0 - 10.5 K/uL 8.6 8.5 11.5(H)  Hemoglobin 12.0 - 15.0 g/dL 10.3(L) 9.1(L) 9.6(L)  Hematocrit 36.0 - 46.0 % 30.9(L) 27.5(L) 28.8(L)  Platelets 150 - 400 K/uL 221 196 196    CMP Latest Ref Rng & Units 08/19/2020 08/05/2020 08/02/2020  Glucose 65 - 99 mg/dL 14/02/2020) 08/07/2020) 08/04/2020)  BUN 7 - 25 mg/dL 23 14 8   Creatinine 0.60 - 0.93 mg/dL 384(Y 659(D 357(S  Sodium 135 - 146 mmol/L 141 141 141  Potassium 3.5 - 5.3 mmol/L 4.0 4.1 4.0  Chloride 98 - 110 mmol/L 107 104 107  CO2 20 - 32 mmol/L 24 26 25   Calcium 8.6 - 10.4 mg/dL 9.5 9.4 )  Total Protein 6.5 - 8.1 g/dL - - -  Total Bilirubin 0.3 - 1.2 mg/dL - - -  Alkaline Phos 38 - 126 U/L - - -  AST 15 - 41 U/L - - -  ALT 0 - 44 U/L - - -    Lipid Panel     Component Value Date/Time   CHOL 198 08/05/2020 0000   TRIG 122 08/05/2020 0000   HDL 64 08/05/2020 0000   CHOLHDL 3.1 08/05/2020 0000   LDLCALC 111 (  H) 08/05/2020 0000    Lab Results  Component Value Date   HGBA1C 6.5 (H) 08/05/2020   HGBA1C 6.5 (H) 03/25/2020   HGBA1C 7.5 (H) 12/11/2019   No components found for: NTPROBNP Lab Results  Component Value Date   TSH 1.17 11/27/2019    Cardiac Panel (last 3 results) No results for input(s): CKTOTAL, CKMB, TROPONINIHS, RELINDX in the last 72 hours.  IMPRESSION:    ICD-10-CM   1. NSVT (nonsustained ventricular tachycardia) (HCC)  I47.2 EKG 12-Lead  2. 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 Mixed hyperlipidemia  E78.2   4. Underweight  R63.6   5. Hospital discharge follow-up  Z09      RECOMMENDATIONS: Daylene Vandenbosch is a 74 y.o. 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  NSVT:  Patient presented to the hospital with episodes of frequent NSVT and also torsades de pointes in the setting of electrolyte abnormalities due to GI symptoms.  Patient was hospitalized and monitored in the ICU.  As electrolyte abnormalities were being corrected the burden of NSVT significantly improved and resolved prior to discharge.  I had a detailed discussion with both the patient and her husband in regards to undergoing an ischemic evaluation as these arrhythmias may be associated with underlying ischemic substrate.  However they wanted to hold off any further testing until discharge.  At today's office visit patient is overall stable and hemodynamically doing well.  She is tolerating low-dose beta-blocker well.  I recommended undergoing stress test given the ventricular arrhythmia noted during her recent hospitalization.  However, patient and her husband choose not to undergo any additional testing at this time.  We also discussed undergoing a extended Holter monitor to reevaluate NSVT burden but they would like to hold off at the current time.  Her most recent labs from August 19, 2020 independently reviewed and noted above for further reference.  Mixed hyperlipidemia:  The patient is encouraged to restart her statin therapy as per her PCP visit yesterday.  Clinically I believe the patient is back to her baseline and she should restart her statin therapy.  Currently managed by primary care provider.  Hospital discharge follow-up: Reviewed the most recent discharge summary, echocardiogram report.  Details noted above.  FINAL MEDICATION LIST END OF ENCOUNTER: No orders of the defined types were placed in this encounter.   There are no discontinued medications.   Current Outpatient Medications:  .  metoprolol succinate (TOPROL-XL) 25 MG 24  hr tablet, Take 0.5 tablets (12.5 mg total) by mouth daily., Disp: 30 tablet, Rfl: 0 .  Multiple Vitamin (MULTIVITAMIN WITH MINERALS) TABS tablet, Take 1 tablet by mouth daily., Disp: , Rfl:  .  ondansetron (ZOFRAN) 4 MG tablet, Take 1 tablet (4 mg total) by mouth every 8 (eight) hours as needed for nausea or vomiting., Disp: 30 tablet, Rfl: 0 .  rosuvastatin (CRESTOR) 5 MG tablet, Take 1 tablet (5 mg total) by mouth daily., Disp: 90 tablet, Rfl: 3  Orders Placed This Encounter  Procedures  . EKG 12-Lead   --Continue cardiac medications as reconciled in final medication list. --Return in about 1 year (around 08/23/2021) for Follow up hx of NSVT and cardiac risk factor. . Or sooner if needed. --Continue follow-up with your primary care physician regarding the management of your other chronic comorbid conditions.  Patient's questions and concerns were addressed to her satisfaction.  She voices understanding of the instructions provided during this encounter.   This note was created using a voice recognition software as a result there may be grammatical errors inadvertently enclosed that do not reflect the nature of this encounter. Every attempt is made to correct such errors.  Total time spent: 30 minutes.  Tessa Lerner, [place], [facility]  Pager: [phone] Office: [phone]

## 2020-08-31 ENCOUNTER — Ambulatory Visit: Payer: Medicare Other | Attending: Internal Medicine

## 2020-08-31 DIAGNOSIS — Z23 Encounter for immunization: Secondary | ICD-10-CM

## 2020-08-31 NOTE — Progress Notes (Signed)
   Covid-19 Vaccination Clinic  Name:  Stephanie Acevedo    MRN: [id number] DOB: 01-18-1946  08/31/2020  Ms. Payment was observed post Covid-19 immunization for 15 minutes without incident. She was provided with Vaccine Information Sheet and instruction to access the V-Safe system.   [name] was instructed to call 911 with any severe reactions post vaccine: Marland Kitchen Difficulty breathing  . Swelling of face and throat  . A fast heartbeat  . A bad rash all over body  . Dizziness and weakness   Immunizations Administered    Name Date Dose VIS Date Route   Pfizer COVID-19 Vaccine 08/31/2020 11:09 AM 0.3 mL 07/03/2020 Intramuscular   Manufacturer: [employer], [employer]   Lot: [id number]   NDC: [address]-7

## 2020-10-17 ENCOUNTER — Encounter: Payer: Self-pay | Admitting: Internal Medicine

## 2020-11-04 ENCOUNTER — Encounter: Payer: Self-pay | Admitting: Internal Medicine

## 2020-11-08 DIAGNOSIS — D225 Melanocytic nevi of trunk: Secondary | ICD-10-CM | POA: Diagnosis not present

## 2020-11-08 DIAGNOSIS — Z85828 Personal history of other malignant neoplasm of skin: Secondary | ICD-10-CM | POA: Diagnosis not present

## 2020-11-08 DIAGNOSIS — D2262 Melanocytic nevi of left upper limb, including shoulder: Secondary | ICD-10-CM | POA: Diagnosis not present

## 2020-11-08 DIAGNOSIS — D2261 Melanocytic nevi of right upper limb, including shoulder: Secondary | ICD-10-CM | POA: Diagnosis not present

## 2020-11-21 ENCOUNTER — Ambulatory Visit (INDEPENDENT_AMBULATORY_CARE_PROVIDER_SITE_OTHER): Payer: Medicare Other | Admitting: Internal Medicine

## 2020-11-21 ENCOUNTER — Encounter: Payer: Self-pay | Admitting: Internal Medicine

## 2020-11-21 ENCOUNTER — Telehealth: Payer: Self-pay

## 2020-11-21 ENCOUNTER — Other Ambulatory Visit: Payer: Self-pay

## 2020-11-21 ENCOUNTER — Other Ambulatory Visit: Payer: Medicare Other

## 2020-11-21 VITALS — BP 108/78 | HR 74 | Temp 96.7°F | Ht 66.0 in | Wt 114.2 lb

## 2020-11-21 DIAGNOSIS — E1142 Type 2 diabetes mellitus with diabetic polyneuropathy: Secondary | ICD-10-CM | POA: Diagnosis not present

## 2020-11-21 DIAGNOSIS — I4729 Other ventricular tachycardia: Secondary | ICD-10-CM

## 2020-11-21 DIAGNOSIS — Z681 Body mass index (BMI) 19 or less, adult: Secondary | ICD-10-CM

## 2020-11-21 DIAGNOSIS — E782 Mixed hyperlipidemia: Secondary | ICD-10-CM

## 2020-11-21 DIAGNOSIS — E1169 Type 2 diabetes mellitus with other specified complication: Secondary | ICD-10-CM | POA: Diagnosis not present

## 2020-11-21 DIAGNOSIS — I472 Ventricular tachycardia: Secondary | ICD-10-CM

## 2020-11-21 DIAGNOSIS — E119 Type 2 diabetes mellitus without complications: Secondary | ICD-10-CM

## 2020-11-21 DIAGNOSIS — F0390 Unspecified dementia without behavioral disturbance: Secondary | ICD-10-CM

## 2020-11-21 DIAGNOSIS — E785 Hyperlipidemia, unspecified: Secondary | ICD-10-CM | POA: Diagnosis not present

## 2020-11-21 DIAGNOSIS — R636 Underweight: Secondary | ICD-10-CM

## 2020-11-21 DIAGNOSIS — Z66 Do not resuscitate: Secondary | ICD-10-CM

## 2020-11-21 NOTE — Progress Notes (Signed)
Location:  [facility] clinic Provider:  Jamala Kohen L. Renato Gails, D.O., C.M.D.  Code Status: DNR Goals of Care:  Advanced Directives 11/21/2020  Does Patient Have a Medical Advance Directive? Yes  Type of Estate agent of [place];Living will;Out of facility DNR (pink MOST or yellow form)  Does patient want to make changes to medical advance directive? No - Patient declined  Copy of Healthcare Power of Attorney in Chart? Yes - validated most recent copy scanned in chart (See row information)  Would patient like information on creating a medical advance directive? -  Pre-existing out of facility DNR order (yellow form or pink MOST form) Yellow form placed in chart (order not valid for inpatient use)     Chief Complaint  Patient presents with  . Medical Management of Chronic Issues    3 month follow-up. Patient refused flu vaccine. Discuss need for eye exam. Discuss metoporol, patient stopped as medication was never renewed. Reactivate DNR.     HPI: Patient is a 75 y.o. female seen today for medical management of chronic diseases.    Continues to refuse flu shot.    Cardiology discontinued her metoprolol per Mr. Schirm.  med removed from list.  No heart racing recently.   Eating well.  Has gained back the weight she lost.  Mr. Cozine says she "must enjoy my cooking".    Sleeps well--about 10 hrs a night.  No significant knee pain lately.  She is crying a lot more.  Says she does not know if she feels sad.  She will say she does not know why she's crying.  Does not make sense.  Might happen when riding in the car for no apparent reason.  Mr. Herling tries to keep her laughing as much as possible.  She walks all of the time.  No wandering off premises.  No falls.    No diarrhea that Mr. Hines is aware of.  Did not tolerate aricept due to this.  Appetite had diminished as well with considerable weight loss.  Past Medical History:  Diagnosis Date  . Dementia (HCC)   . Diabetes  mellitus without complication (HCC)   . Hyperlipidemia   . Hypokalemia   . Torsades de pointes (HCC)   . Ventricular tachycardia, non-sustained (HCC)     History reviewed. No pertinent surgical history.  No Known Allergies  Outpatient Encounter Medications as of 11/21/2020  Medication Sig  . Multiple Vitamin (MULTIVITAMIN WITH MINERALS) TABS tablet Take 1 tablet by mouth daily.  . rosuvastatin (CRESTOR) 5 MG tablet Take 1 tablet (5 mg total) by mouth daily.  . metoprolol succinate (TOPROL-XL) 25 MG 24 hr tablet Take 0.5 tablets (12.5 mg total) by mouth daily. (Patient not taking: Reported on 11/21/2020)  . [DISCONTINUED] ondansetron (ZOFRAN) 4 MG tablet Take 1 tablet (4 mg total) by mouth every 8 (eight) hours as needed for nausea or vomiting.   No facility-administered encounter medications on file as of 11/21/2020.    Review of Systems:  Review of Systems  Constitutional: Negative for chills and fever.  HENT: Negative for congestion, hearing loss and sore throat.   Eyes: Negative for blurred vision.       Glasses  Respiratory: Negative for cough and shortness of breath.   Cardiovascular: Negative for chest pain, palpitations and leg swelling.  Gastrointestinal: Negative for abdominal pain, blood in stool, constipation, diarrhea and melena.  Genitourinary: Negative for dysuria.  Musculoskeletal: Negative for falls and joint pain.  Skin: Negative for itching  and rash.  Neurological: Negative for dizziness and loss of consciousness.  Endo/Heme/Allergies: Does not bruise/bleed easily.  Psychiatric/Behavioral: Positive for memory loss. The patient is not nervous/anxious and does not have insomnia.        Tearful spells (?PBA)    Health Maintenance  Topic Date Due  . OPHTHALMOLOGY EXAM  Never done  . URINE MICROALBUMIN  01/21/2021  . FOOT EXAM  03/15/2021 (Originally 06/04/1956)  . MAMMOGRAM  04/04/2021 (Originally 06/04/1997)  . DEXA SCAN  04/04/2021 (Originally 06/04/2012)  .  COLONOSCOPY (Pts 45-344yrs Insurance coverage will need to be confirmed)  04/04/2021 (Originally 06/04/1992)  . TETANUS/TDAP  04/04/2021 (Originally 06/04/1966)  . HEMOGLOBIN A1C  02/02/2021  . PNA vac Low Risk Adult (2 of 2 - PPSV23) 04/04/2021  . COVID-19 Vaccine  Completed  . Hepatitis C Screening  Completed  . HPV VACCINES  Aged Out  . INFLUENZA VACCINE  Discontinued    Physical Exam: Vitals:   11/21/20 0800  BP: 108/78  Pulse: 74  Temp: (!) 96.7 F (35.9 C)  TempSrc: Temporal  SpO2: 96%  Weight: 114 lb 3.2 oz (51.8 kg)  Height: 5\' 6"  (1.676 m)   Body mass index is 18.43 kg/m. Physical Exam Vitals reviewed.  Constitutional:      General: She is not in acute distress.    Appearance: She is not toxic-appearing.     Comments: A bit disheveled  HENT:     Head: Normocephalic and atraumatic.  Eyes:     Comments: glasses  Cardiovascular:     Rate and Rhythm: Normal rate and regular rhythm.     Pulses: Normal pulses.     Heart sounds: Normal heart sounds.  Pulmonary:     Effort: Pulmonary effort is normal.     Breath sounds: Normal breath sounds. No wheezing, rhonchi or rales.  Abdominal:     General: Bowel sounds are normal. There is no distension.  Musculoskeletal:     Right lower leg: No edema.     Left lower leg: No edema.  Skin:    General: Skin is warm and dry.  Neurological:     General: No focal deficit present.     Mental Status: She is alert and oriented to person, place, and time.     Gait: Gait abnormal (stiffness of knee chronically).  Psychiatric:        Mood and Affect: Mood normal.     Comments: Responding appropriately today, quiet and does not volunteer information (never has since coming here)     Labs reviewed: Basic Metabolic Panel: Recent Labs    11/27/19 1540 12/11/19 1144 07/29/20 0059 07/29/20 0836 07/29/20 1517 07/30/20 0048 08/02/20 0121 08/05/20 0000 08/19/20 0804  NA 142   < > 141   < >  --    < > 141 141 141  K 3.4*   < >  2.6*   < >  --    < > 4.0 4.1 4.0  CL 104   < > 106   < >  --    < > 107 104 107  CO2 26   < > 29   < >  --    < > 25 26 24   GLUCOSE 140*   < > 169*   < >  --    < > 134* 118* 139*  BUN 17   < > 8   < >  --    < > 8 14 23   CREATININE 0.79   < >  1.09*   < >  --    < > 0.92 0.79 0.85  CALCIUM 9.7   < > 8.1*   < >  --    < > 8.5* 9.4 9.5  MG  --    < > 2.6*  --  2.2  --   --  2.4 2.2  PHOS  --   --  3.1  --   --   --   --   --   --   TSH 1.17  --   --   --   --   --   --   --   --    < > = values in this interval not displayed.   Liver Function Tests: Recent Labs    11/27/19 1540 07/28/20 0929  AST 17 25  ALT 11 23  ALKPHOS  --  51  BILITOT 0.3 0.6  PROT 7.3 6.9  ALBUMIN  --  3.6   No results for input(s): LIPASE, AMYLASE in the last 8760 hours. No results for input(s): AMMONIA in the last 8760 hours. CBC: Recent Labs    11/27/19 1540 07/28/20 0929 07/29/20 0102 08/01/20 0333 08/02/20 0121  WBC 9.4   < > 11.5* 8.5 8.6  NEUTROABS 6,401  --   --   --   --   HGB 13.1   < > 9.6* 9.1* 10.3*  HCT 38.3   < > 28.8* 27.5* 30.9*  MCV 87.0   < > 88.3 88.7 88.0  PLT 332   < > 196 196 221   < > = values in this interval not displayed.   Lipid Panel: Recent Labs    03/25/20 0846 08/05/20 0000  CHOL 233* 198  HDL 64 64  LDLCALC 135* 111*  TRIG 202* 122  CHOLHDL 3.6 3.1   Lab Results  Component Value Date   HGBA1C 6.5 (H) 08/05/2020    Assessment/Plan 1. 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 Mixed hyperlipidemia -continues on crestor due to very high cholesterol she's had  3. Controlled type 2 diabetes mellitus with diabetic polyneuropathy, without long-term current use of insulin (HCC) -control has been good just with dietary changes her husband has been working on for her (historically was living off of chocolate when she first came here and would not eat much else) Lab Results  Component Value Date   HGBA1C 7.1 (H) 11/21/2020   4. NSVT (nonsustained ventricular tachycardia) (HCC) -had been started on metoprolol for this, but did not have recurrent episodes and this was discontinued  5.  Do not resuscitate - previously discussed and documented, had ED visit so it was discontinued and not reentered - Do not attempt resuscitation (DNR)  6.  Underweight/BMI under 19 -encouraged more frequent small meals and snacks, supplement shakes as tolerates -has gained weight recently and intake better  Labs/tests ordered:  Lab Orders     Basic metabolic panel     Lipid panel     Hemoglobin A1c  Next appt:     Joden Bonsall L. Januel Doolan, D.O. Geriatrics [facility] [address], [place] [address] Cell Phone (Mon-Fri 8am-5pm):  [phone] On Call:  [phone] & follow prompts after 5pm & weekends Office Phone:  [phone] Office Fax:  [phone]

## 2020-11-21 NOTE — Telephone Encounter (Signed)
Attempted to contact patient to schedule a Palliative Care consult appointment. No answer left a message to return call.  

## 2020-11-22 LAB — BASIC METABOLIC PANEL
BUN: 19 mg/dL (ref 7–25)
CO2: 23 mmol/L (ref 20–32)
Calcium: 9.7 mg/dL (ref 8.6–10.4)
Chloride: 105 mmol/L (ref 98–110)
Creat: 0.9 mg/dL (ref 0.60–0.93)
Glucose, Bld: 151 mg/dL — ABNORMAL HIGH (ref 65–99)
Potassium: 4.3 mmol/L (ref 3.5–5.3)
Sodium: 140 mmol/L (ref 135–146)

## 2020-11-22 LAB — LIPID PANEL
Cholesterol: 260 mg/dL — ABNORMAL HIGH (ref <200)
HDL: 95 mg/dL (ref >=50)
LDL Cholesterol (Calc): 134 mg/dL (calc) — ABNORMAL HIGH
Non-HDL Cholesterol (Calc): 165 mg/dL (calc) — ABNORMAL HIGH (ref <130)
Total CHOL/HDL Ratio: 2.7 (calc) (ref <5.0)
Triglycerides: 169 mg/dL — ABNORMAL HIGH (ref <150)

## 2020-11-22 LAB — HEMOGLOBIN A1C
Hgb A1c MFr Bld: 7.1 % of total Hgb — ABNORMAL HIGH (ref <5.7)
Mean Plasma Glucose: 157 mg/dL
eAG (mmol/L): 8.7 mmol/L

## 2020-11-24 DIAGNOSIS — Z66 Do not resuscitate: Secondary | ICD-10-CM | POA: Insufficient documentation

## 2020-11-24 DIAGNOSIS — E1142 Type 2 diabetes mellitus with diabetic polyneuropathy: Secondary | ICD-10-CM | POA: Insufficient documentation

## 2020-11-25 NOTE — Telephone Encounter (Signed)
Opened in error

## 2020-11-27 ENCOUNTER — Other Ambulatory Visit: Payer: Self-pay

## 2020-11-27 ENCOUNTER — Ambulatory Visit: Payer: Medicare Other | Admitting: Internal Medicine

## 2020-11-27 MED ORDER — ROSUVASTATIN CALCIUM 10 MG PO TABS: 10.0000 mg | ORAL_TABLET | Freq: Every day | ORAL | 1 refills | Status: DC

## 2020-11-28 ENCOUNTER — Telehealth: Payer: Self-pay

## 2020-11-28 ENCOUNTER — Other Ambulatory Visit: Payer: Self-pay

## 2020-11-28 ENCOUNTER — Encounter: Payer: Self-pay | Admitting: Nurse Practitioner

## 2020-11-28 ENCOUNTER — Ambulatory Visit (INDEPENDENT_AMBULATORY_CARE_PROVIDER_SITE_OTHER): Payer: Medicare Other | Admitting: Nurse Practitioner

## 2020-11-28 DIAGNOSIS — Z0000 Encounter for general adult medical examination without abnormal findings: Secondary | ICD-10-CM | POA: Diagnosis not present

## 2020-11-28 DIAGNOSIS — Z1231 Encounter for screening mammogram for malignant neoplasm of breast: Secondary | ICD-10-CM | POA: Diagnosis not present

## 2020-11-28 DIAGNOSIS — E2839 Other primary ovarian failure: Secondary | ICD-10-CM | POA: Diagnosis not present

## 2020-11-28 NOTE — Telephone Encounter (Signed)
Spoke with patient's husband Dorinda Hill and scheduled an in-person Palliative Consult for 12/17/20 @ 9AM  COVID screening was negative. No pets in home. Patient lives with  husband.  Consent obtained; updated Outlook/Netsmart/Team List and Epic.  Family is aware they may be receiving a call from NP the day before or day of to confirm appointment.

## 2020-11-28 NOTE — Telephone Encounter (Signed)
Stephanie Acevedo, Stephanie Acevedo are scheduled for a virtual visit with your provider today.    Just as we do with appointments in the office, we must obtain your consent to participate.  Your consent will be active for this visit and any virtual visit you may have with one of our providers in the next 365 days.    If you have a MyChart account, I can also send a copy of this consent to you electronically.  All virtual visits are billed to your insurance company just like a traditional visit in the office.  As this is a virtual visit, video technology does not allow for your provider to perform a traditional examination.  This may limit your provider's ability to fully assess your condition.  If your provider identifies any concerns that need to be evaluated in person or the need to arrange testing such as labs, EKG, etc, we will make arrangements to do so.    Although advances in technology are sophisticated, we cannot ensure that it will always work on either your end or our end.  If the connection with a video visit is poor, we may have to switch to a telephone visit.  With either a video or telephone visit, we are not always able to ensure that we have a secure connection.   I need to obtain your verbal consent now.   Are you willing to proceed with your visit today?   Stephanie Acevedo has provided verbal consent on 11/28/2020 for a virtual visit (video or telephone).   Stephanie Acevedo, CMA 11/28/2020  1:40 PM

## 2020-11-28 NOTE — Progress Notes (Signed)
This service is provided via telemedicine  No vital signs collected/recorded due to the encounter was a telemedicine visit.   Location of patient (ex: home, work):  Home  Patient consents to a telephone visit:  Yes, see encounter dated 11/28/2020  Location of the provider (ex: office, home): [facility]  Name of any referring provider:  Bufford Spikes, DO  Names of all persons participating in the telemedicine service and their role in the encounter: Abbey Chatters, Nurse Practitioner, Elveria Royals, CMA, and patient.   Time spent on call:  9 minutes with medical assistant

## 2020-11-28 NOTE — Progress Notes (Signed)
Subjective:   Marlei Glomski is a 75 y.o. female who presents for Medicare Annual (Subsequent) preventive examination.  Review of Systems           Objective:    There were no vitals filed for this visit. There is no height or weight on file to calculate BMI.  Advanced Directives 11/28/2020 11/21/2020 08/22/2020 08/05/2020 08/02/2020 07/28/2020 04/04/2020  Does Patient Have a Medical Advance Directive? Yes Yes Yes Yes Yes Yes Yes  Type of Estate agent of [place];Living will;Out of facility DNR (pink MOST or yellow form) Healthcare Power of [place];Living will;Out of facility DNR (pink MOST or yellow form) Healthcare Power of [place];Living will Healthcare Power of [place];Living will Healthcare Power of [place];Living will Living will Out of facility DNR (pink MOST or yellow form)  Does patient want to make changes to medical advance directive? No - Patient declined No - Patient declined No - Patient declined No - Patient declined No - Patient declined No - Patient declined No - Patient declined  Copy of Healthcare Power of Attorney in Chart? Yes - validated most recent copy scanned in chart (See row information) Yes - validated most recent copy scanned in chart (See row information) Yes - validated most recent copy scanned in chart (See row information) Yes - validated most recent copy scanned in chart (See row information) No - copy requested - -  Would patient like information on creating a medical advance directive? - - - - - - -  Pre-existing out of facility DNR order (yellow form or pink MOST form) Yellow form placed in chart (order not valid for inpatient use) Yellow form placed in chart (order not valid for inpatient use) - - - - -    Current Medications (verified) Outpatient Encounter Medications as of 11/28/2020  Medication Sig  . Multiple Vitamin (MULTIVITAMIN WITH MINERALS) TABS tablet Take 1 tablet by mouth daily.  . rosuvastatin (CRESTOR) 10 MG tablet  Take 1 tablet (10 mg total) by mouth daily.   No facility-administered encounter medications on file as of 11/28/2020.    Allergies (verified) Aricept [donepezil hcl]   History: Past Medical History:  Diagnosis Date  . Dementia (HCC)   . Diabetes mellitus without complication (HCC)   . Hyperlipidemia   . Hypokalemia   . Torsades de pointes (HCC)   . Ventricular tachycardia, non-sustained (HCC)    History reviewed. No pertinent surgical history. Family History  Problem Relation Age of Onset  . Dementia Mother 26   Social History   Socioeconomic History  . Marital status: Married    Spouse name: Dorinda Hill   . Number of children: 1  . Years of education: 39  . Highest education level: Bachelor's degree (e.g., BA, AB, BS)  Occupational History  . Not on file  Tobacco Use  . Smoking status: Never Smoker  . Smokeless tobacco: Never Used  Vaping Use  . Vaping Use: Never used  Substance and Sexual Activity  . Alcohol use: Never  . Drug use: Never  . Sexual activity: Not Currently  Other Topics Concern  . Not on file  Social History Narrative   Diet:       Do you drink/ eat things with caffeine?  Yes      Marital status:  Married                             What year were you married ?  1970      Do you live in a house, apartment,assistred living, condo, trailer, etc.)?  House      Is it one or more stories?  No      How many persons live in your home ?  2      Do you have any pets in your home ?(please list) None      Highest Level of education completed: College BS      Current or past profession: Aeronautical engineerCustomer Service Manager      Do you exercise?   Yes                           Type & how often  Walking, Daily      ADVANCED DIRECTIVES (Please bring copies)      Do you have a living will? No      Do you have a DNR form?                       If not, do you want to discuss one? Yes      Do you have signed POA?HPOA forms?  Yes               If so, please bring to  your appointment      FUNCTIONAL STATUS- To be completed by Spouse / child / Staff       Do you have difficulty bathing or dressing yourself ? No      Do you have difficulty preparing food or eating ?  No      Do you have difficulty managing your mediation ?No      Do you have difficulty managing your finances ? Yes      Do you have difficulty affording your medication ? No      Social Determinants of Corporate investment bankerHealth   Financial Resource Strain: Not on file  Food Insecurity: Not on file  Transportation Needs: Not on file  Physical Activity: Not on file  Stress: Not on file  Social Connections: Not on file    Tobacco Counseling Counseling given: Not Answered   Clinical Intake:  Pre-visit preparation completed: Yes  Pain : No/denies pain     BMI - recorded: 18 Nutritional Status: BMI <19  Underweight Nutritional Risks: None Diabetes: No     Diabetic?no         Activities of Daily Living In your present state of health, do you have any difficulty performing the following activities: 08/02/2020 07/28/2020  Hearing? - N  Vision? - N  Difficulty concentrating or making decisions? - Y  Walking or climbing stairs? - N  Dressing or bathing? - Y  Doing errands, shopping? Malvin JohnsY Y  Some recent data might be hidden    Patient Care Team: Octavia HeirFargo, Amy E, NP as PCP - General (Adult Health Nurse Practitioner)  Indicate any recent Medical Services you may have received from other than [facility] providers in the past year (date may be approximate).     Assessment:   This is a routine wellness examination for [place].  Hearing/Vision screen  Hearing Screening   125Hz  250Hz  500Hz  1000Hz  2000Hz  3000Hz  4000Hz  6000Hz  8000Hz   Right ear:           Left ear:           Comments: Patient has no hearing problems.  Vision Screening Comments: Patient has no vision problems. Patient has not had  an eye exam within 3 years  Dietary issues and exercise activities discussed:    Goals    None    Depression Screen PHQ 2/9 Scores 11/28/2020 11/21/2020 08/22/2020 08/05/2020 04/04/2020 12/11/2019 11/27/2019  PHQ - 2 Score 0 0 0 0 0 0 0    Fall Risk Fall Risk  11/28/2020 11/21/2020 08/22/2020 08/05/2020 04/04/2020  Falls in the past year? 0 0 0 0 0  Number falls in past yr: 0 0 0 0 0  Injury with Fall? 0 0 0 0 0    FALL RISK PREVENTION PERTAINING TO THE HOME:  Any stairs in or around the home? No  If so, are there any without handrails? No  Home free of loose throw rugs in walkways, pet beds, electrical cords, etc? Yes  Adequate lighting in your home to reduce risk of falls? Yes   ASSISTIVE DEVICES UTILIZED TO PREVENT FALLS:  Life alert? No  Use of a cane, walker or w/c? No  Grab bars in the bathroom? Yes  Shower chair or bench in shower? No  Elevated toilet seat or a handicapped toilet? No   TIMED UP AND GO:  Was the test performed? No .    Cognitive Function: MMSE - Mini Mental State Exam 11/27/2019  Orientation to time 1  Orientation to Place 2.5  Registration 1  Attention/ Calculation 2.5  Recall 1  Language- name 2 objects 2  Language- repeat 0  Language- follow 3 step command 1  Language- read & follow direction 0  Write a sentence 0  Copy design 0  Total score 11     6CIT Screen 11/28/2020  What Year? 4 points  What month? 3 points  What time? 0 points  Count back from 20 4 points  Months in reverse 4 points  Repeat phrase 10 points  Total Score 25    Immunizations Immunization History  Administered Date(s) Administered  . PFIZER(Purple Top)SARS-COV-2 Vaccination 01/18/2020, 02/13/2020, 08/31/2020  . Pneumococcal Conjugate-13 04/04/2020    TDAP status: Due, Education has been provided regarding the importance of this vaccine. Advised may receive this vaccine at local pharmacy or Health Dept. Aware to provide a copy of the vaccination record if obtained from local pharmacy or Health Dept. Verbalized acceptance and understanding.  Flu  Vaccine status: Due, Education has been provided regarding the importance of this vaccine. Advised may receive this vaccine at local pharmacy or Health Dept. Aware to provide a copy of the vaccination record if obtained from local pharmacy or Health Dept. Verbalized acceptance and understanding.  Pneumococcal vaccine status: Due, Education has been provided regarding the importance of this vaccine. Advised may receive this vaccine at local pharmacy or Health Dept. Aware to provide a copy of the vaccination record if obtained from local pharmacy or Health Dept. Verbalized acceptance and understanding.  Covid-19 vaccine status: Completed vaccines  Qualifies for Shingles Vaccine? Yes   Zostavax completed No   Shingrix Completed?: No.    Education has been provided regarding the importance of this vaccine. Patient has been advised to call insurance company to determine out of pocket expense if they have not yet received this vaccine. Advised may also receive vaccine at local pharmacy or Health Dept. Verbalized acceptance and understanding.  Screening Tests Health Maintenance  Topic Date Due  . OPHTHALMOLOGY EXAM  Never done  . Fecal DNA (Cologuard)  Never done  . URINE MICROALBUMIN  01/21/2021  . FOOT EXAM  03/15/2021 (Originally 06/04/1956)  . MAMMOGRAM  04/04/2021 (Originally  06/04/1997)  . DEXA SCAN  04/04/2021 (Originally 06/04/2012)  . TETANUS/TDAP  04/04/2021 (Originally 06/04/1966)  . PNA vac Low Risk Adult (2 of 2 - PPSV23) 04/04/2021  . HEMOGLOBIN A1C  05/24/2021  . COVID-19 Vaccine  Completed  . Hepatitis C Screening  Completed  . HPV VACCINES  Aged Out  . INFLUENZA VACCINE  Discontinued    Health Maintenance  Health Maintenance Due  Topic Date Due  . OPHTHALMOLOGY EXAM  Never done  . Fecal DNA (Cologuard)  Never done  . URINE MICROALBUMIN  01/21/2021      Mammogram status: Ordered today. Pt provided with contact info and advised to call to schedule appt.   Bone Density  status: Ordered today. Pt provided with contact info and advised to call to schedule appt.  Lung Cancer Screening: (Low Dose CT Chest recommended if Age 71-80 years, 30 pack-year currently smoking OR have quit w/in 15years.) does not qualify.    Additional Screening:  Hepatitis C Screening: does qualify; Completed 2021   Vision Screening: Recommended annual ophthalmology exams for early detection of glaucoma and other disorders of the eye. Is the patient up to date with their annual eye exam?  no Who is the provider or what is the name of the office in which the patient attends annual eye exams? unsure If pt is not established with a provider, would they like to be referred to a provider to establish care? No .   Dental Screening: Recommended annual dental exams for proper oral hygiene  Community Resource Referral / Chronic Care Management: CRR required this visit?  No   CCM required this visit?  No      Plan:     I have personally reviewed and noted the following in the patient's chart:   . Medical and social history . Use of alcohol, tobacco or illicit drugs  . Current medications and supplements . Functional ability and status . Nutritional status . Physical activity . Advanced directives . List of other physicians . Hospitalizations, surgeries, and ER visits in previous 12 months . Vitals . Screenings to include cognitive, depression, and falls . Referrals and appointments  In addition, I have reviewed and discussed with patient certain preventive protocols, quality metrics, and best practice recommendations. A written personalized care plan for preventive services as well as general preventive health recommendations were provided to patient.     Sharon Seller, NP   11/28/2020    Virtual Visit via Telephone Note  I connected with@ on 11/28/20 at 11:00 AM EDT by telephone and verified that I am speaking with the correct person using two  identifiers.  Location: Patient: home Provider: [facility]   I discussed the limitations, risks, security and privacy concerns of performing an evaluation and management service by telephone and the availability of in person appointments. I also discussed with the patient that there may be a patient responsible charge related to this service. The patient expressed understanding and agreed to proceed.   I discussed the assessment and treatment plan with the patient. The patient was provided an opportunity to ask questions and all were answered. The patient agreed with the plan and demonstrated an understanding of the instructions.   The patient was advised to call back or seek an in-person evaluation if the symptoms worsen or if the condition fails to improve as anticipated.  I provided 18 minutes of non-face-to-face time during this encounter.  Janene Harvey. [employer]ag[address] [employer] [address] printed and mailed

## 2020-11-28 NOTE — Patient Instructions (Addendum)
Stephanie Acevedo , Thank you for taking time to come for your Medicare Wellness Visit. I appreciate your ongoing commitment to your health goals. Please review the following plan we discussed and let me know if I can assist you in the future.   Screening recommendations/referrals: Colonoscopy - recommend cologuard  Mammogram Due at this time. Call [phone] to schedule.  Bone Density Due at this time. Call [phone] to schedule Recommended yearly ophthalmology/optometry visit for glaucoma screening and checkup Recommended yearly dental visit for hygiene and checkup  Vaccinations: Influenza vaccine- recommended to get yearly Pneumococcal vaccine will be due for pneumococcal 23 in July Tdap vaccine RECOMMENDED to get at local pharmacy Shingles vaccine RECOMMENDED to get at local pharmacy    Advanced directives: on file.   Conditions/risks identified: progressive dementia, underweight  Next appointment: 1 year for AWV   Preventive Care 75 Years and Older, Female Preventive care refers to lifestyle choices and visits with your health care provider that can promote health and wellness. What does preventive care include?  A yearly physical exam. This is also called an annual well check.  Dental exams once or twice a year.  Routine eye exams. Ask your health care provider how often you should have your eyes checked.  Personal lifestyle choices, including:  Daily care of your teeth and gums.  Regular physical activity.  Eating a healthy diet.  Avoiding tobacco and drug use.  Limiting alcohol use.  Practicing safe sex.  Taking low-dose aspirin every day.  Taking vitamin and mineral supplements as recommended by your health care provider. What happens during an annual well check? The services and screenings done by your health care provider during your annual well check will depend on your age, overall health, lifestyle risk factors, and family history of disease. Counseling   Your health care provider may ask you questions about your:  Alcohol use.  Tobacco use.  Drug use.  Emotional well-being.  Home and relationship well-being.  Sexual activity.  Eating habits.  History of falls.  Memory and ability to understand (cognition).  Work and work Astronomer.  Reproductive health. Screening  You may have the following tests or measurements:  Height, weight, and BMI.  Blood pressure.  Lipid and cholesterol levels. These may be checked every 5 years, or more frequently if you are over 93 years old.  Skin check.  Lung cancer screening. You may have this screening every year starting at age 82 if you have a 30-pack-year history of smoking and currently smoke or have quit within the past 15 years.  Fecal occult blood test (FOBT) of the stool. You may have this test every year starting at age 59.  Flexible sigmoidoscopy or colonoscopy. You may have a sigmoidoscopy every 5 years or a colonoscopy every 10 years starting at age 38.  Hepatitis C blood test.  Hepatitis B blood test.  Sexually transmitted disease (STD) testing.  Diabetes screening. This is done by checking your blood sugar (glucose) after you have not eaten for a while (fasting). You may have this done every 1-3 years.  Bone density scan. This is done to screen for osteoporosis. You may have this done starting at age 64.  Mammogram. This may be done every 1-2 years. Talk to your health care provider about how often you should have regular mammograms. Talk with your health care provider about your test results, treatment options, and if necessary, the need for more tests. Vaccines  Your health care provider may recommend certain vaccines,  such as:  Influenza vaccine. This is recommended every year.  Tetanus, diphtheria, and acellular pertussis (Tdap, Td) vaccine. You may need a Td booster every 10 years.  Zoster vaccine. You may need this after age 37.  Pneumococcal 13-valent  conjugate (PCV13) vaccine. One dose is recommended after age 15.  Pneumococcal polysaccharide (PPSV23) vaccine. One dose is recommended after age 62. Talk to your health care provider about which screenings and vaccines you need and how often you need them. This information is not intended to replace advice given to you by your health care provider. Make sure you discuss any questions you have with your health care provider. Document Released: 09/27/2015 Document Revised: 05/20/2016 Document Reviewed: 07/02/2015 Elsevier Interactive Patient Education  2017 [place] Prevention in the Home Falls can cause injuries. They can happen to people of all ages. There are many things you can do to make your home safe and to help prevent falls. What can I do on the outside of my home?  Regularly fix the edges of walkways and driveways and fix any cracks.  Remove anything that might make you trip as you walk through a door, such as a raised step or threshold.  Trim any bushes or trees on the path to your home.  Use bright outdoor lighting.  Clear any walking paths of anything that might make someone trip, such as rocks or tools.  Regularly check to see if handrails are loose or broken. Make sure that both sides of any steps have handrails.  Any raised decks and porches should have guardrails on the edges.  Have any leaves, snow, or ice cleared regularly.  Use sand or salt on walking paths during winter.  Clean up any spills in your garage right away. This includes oil or grease spills. What can I do in the bathroom?  Use night lights.  Install grab bars by the toilet and in the tub and shower. Do not use towel bars as grab bars.  Use non-skid mats or decals in the tub or shower.  If you need to sit down in the shower, use a plastic, non-slip stool.  Keep the floor dry. Clean up any water that spills on the floor as soon as it happens.  Remove soap buildup in the tub or  shower regularly.  Attach bath mats securely with double-sided non-slip rug tape.  Do not have throw rugs and other things on the floor that can make you trip. What can I do in the bedroom?  Use night lights.  Make sure that you have a light by your bed that is easy to reach.  Do not use any sheets or blankets that are too big for your bed. They should not hang down onto the floor.  Have a firm chair that has side arms. You can use this for support while you get dressed.  Do not have throw rugs and other things on the floor that can make you trip. What can I do in the kitchen?  Clean up any spills right away.  Avoid walking on wet floors.  Keep items that you use a lot in easy-to-reach places.  If you need to reach something above you, use a strong step stool that has a grab bar.  Keep electrical cords out of the way.  Do not use floor polish or wax that makes floors slippery. If you must use wax, use non-skid floor wax.  Do not have throw rugs and other things on  the floor that can make you trip. What can I do with my stairs?  Do not leave any items on the stairs.  Make sure that there are handrails on both sides of the stairs and use them. Fix handrails that are broken or loose. Make sure that handrails are as long as the stairways.  Check any carpeting to make sure that it is firmly attached to the stairs. Fix any carpet that is loose or worn.  Avoid having throw rugs at the top or bottom of the stairs. If you do have throw rugs, attach them to the floor with carpet tape.  Make sure that you have a light switch at the top of the stairs and the bottom of the stairs. If you do not have them, ask someone to add them for you. What else can I do to help prevent falls?  Wear shoes that:  Do not have high heels.  Have rubber bottoms.  Are comfortable and fit you well.  Are closed at the toe. Do not wear sandals.  If you use a stepladder:  Make sure that it is fully  opened. Do not climb a closed stepladder.  Make sure that both sides of the stepladder are locked into place.  Ask someone to hold it for you, if possible.  Clearly mark and make sure that you can see:  Any grab bars or handrails.  First and last steps.  Where the edge of each step is.  Use tools that help you move around (mobility aids) if they are needed. These include:  Canes.  Walkers.  Scooters.  Crutches.  Turn on the lights when you go into a dark area. Replace any light bulbs as soon as they burn out.  Set up your furniture so you have a clear path. Avoid moving your furniture around.  If any of your floors are uneven, fix them.  If there are any pets around you, be aware of where they are.  Review your medicines with your doctor. Some medicines can make you feel dizzy. This can increase your chance of falling. Ask your doctor what other things that you can do to help prevent falls. This information is not intended to replace advice given to you by your health care provider. Make sure you discuss any questions you have with your health care provider. Document Released: 06/27/2009 Document Revised: 02/06/2016 Document Reviewed: 10/05/2014 Elsevier Interactive Patient Education  2017 [employer].

## 2020-12-17 ENCOUNTER — Other Ambulatory Visit: Payer: Medicare Other | Admitting: Nurse Practitioner

## 2020-12-17 ENCOUNTER — Other Ambulatory Visit: Payer: Self-pay

## 2020-12-17 DIAGNOSIS — G4700 Insomnia, unspecified: Secondary | ICD-10-CM

## 2020-12-17 DIAGNOSIS — Z515 Encounter for palliative care: Secondary | ICD-10-CM | POA: Diagnosis not present

## 2020-12-17 NOTE — Progress Notes (Signed)
[facility] Consult Note Telephone: [phone]  Fax: [phone]  PATIENT NAME: Stephanie Acevedo [address] [phone] (home)  DOB: 06-Sep-1946 MRN: [id number]  PRIMARY CARE PROVIDER:    Yvonna Alanis, NP,  [address] [place] [address] [phone]  REFERRING PROVIDER:   Dr. Hollace Kinnier  RESPONSIBLE PARTY:   Extended Emergency Contact Information Primary Emergency Contact: Jefferson Davis, [place] Phone: [phone] Mobile Phone: [phone] Relation: Spouse Secondary Emergency Contact: Aller,Renee Address: [place] [address] [place] Mobile Phone: [phone] Relation: Daughter  I met face to face with patient and family in husband in home.  Chief Complain: Insomnia  ASSESSMENT AND RECOMMENDATIONS:   Advance Care Planning: Today's visit consisted of building trust and discussions on Palliative care medicine as specialized medical care for people living with serious illness, aimed at facilitating improved quality of life through symptoms relief, assisting with advance care planning and establishing goals of care. Family expressed appreciation for education provided on Palliative care and how it differs from Hospice service. Palliative care will continue to provide support to patient, family and the medical team. Goal of care: Goal of care is comfort while preserving function. Husband desire is to keep patient happy and laughing. Directives:  After discussions on ramifications and implications of code status, patient's husband verbalized that patient does not want to be resuscitated in the event of cardiac or respiratory arrest. DNR form signed for patient. The need to complete a MOST form was discussed, patient's husband expressed interest in completing a MOST form today.  Discussed and reviewed sections of the form in detail, opportunity for  questions given, all questions answered. Form completed and signed with patient husband, signed form given to him to keep. Details of the MOST form include; comfort measures, determine use or limitation of antibiotics when infection occurs, IV fluids for a defined trial period, no feeding tube.  I spent 48 minutes providing this consultation. More than 50% of the time in this consultation was spent counseling and coordinating communication.  ---------------------------------------------------------------------------------   Symptom Management:  Insomnia: Recommend starting patient on Melatonin 20m at bedtime, take 30 minutes to 1hr before bedtime. If no improvement in sleep after 3 days, may increase dose to 652m(2 tabs). Dementia: Patient with hx of intolerance to Aricept- caused diarrhea. Continue supportive care. Encourage activities that are both stimulating and enjoyable to patient. Help patient stay calm and oriented. If crying and pacing continues may consider Psychotropic drug like Seroquel 2529my mouth at bedtime. Questions and concerns were addressed. Patient and family was encouraged to call with questions and/or concerns. My business card was provided.  Follow up Palliative Care Visit: Palliative care will continue to follow for complex decision making and symptom management. Return in about 4 weeks or prn.  Family /Caregiver/Community Supports: Patient lives at home with husband who is her main caregiver.  Cognitive / Functional decline: Patient awake and alert, confused, occassionally able to carry on conversation. She is able to complete her ADLs independently, walks without assistive device, continent of bowel and bladder. No report of falls.  History obtained from review of [facility] and discussion with patient and her husband. Records reviewed and summarized bellow.  HISTORY OF PRESENT ILLNESS:  Stephanie Acevedo a 74 83o. year old female with multiple medical problems  including Dementia without behavior disturbance (FAST 5),  type 2 diabetes. Patient husband report patient with insomnia that is recently worsening with patient having difficulty staying asleep, report insomnia associated with occasional restlessness. Patient has used any thing to relief the symptoms. Husband also report episodes of crying without provocation, husband report making patient laugh stops the crying episodes. Patient husband also report patient pacing a lot, also with visula and auditory hallucination, saying patient often talks to herself. No report of uncontrolled pain, no report fever or chills. All 10 point systems reviewed and are negative except as documented in history of present illness. Palliative Care was asked to follow this patient to help address advance care planning, goals of care, and symptoms management. This is an initial visit.  Reviewed labs from 11/21/2020 Na 140, K 4.3, Cr 0.9, A1c 7.1 Reviewed Imaging 07/28/2020 IMPRESSION: 1. No evidence of acute intracranial abnormality. 2. 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  CODE STATUS: DNR  PPS: 70%  HOSPICE ELIGIBILITY/DIAGNOSIS: TBD  Physical Exam: Current and past weights:114lbs, Ht 77f6:, BMI 18.43 on 11/21/2020 Constitutional: NAD General: frail appearing, thin, cooperative EYES: anicteric sclera, no discharge  ENMT: intact hearing, oral mucous membranes moist CV:  no LE edema, RRR Pulmonary: no increased work of breathing, no cough, no audible wheezes, room air Abdomen: no ascites GU: deferred MSK: moves all extremities, ambulatory Skin: warm and dry, no rashes or wounds on visible skin Neuro: Generalized weakness, otherwise no focal Psych: non-anxious affect today, A and O x 1 Hem/lymph/immuno: no widespread bruising  PAST MEDICAL HISTORY:  Past Medical History:  Diagnosis Date  . Dementia ([facility]   . Diabetes mellitus without complication ([facility]   . Hyperlipidemia   .  Hypokalemia   . Torsades de pointes ([facility]   . Ventricular tachycardia, non-sustained (HCC)     SOCIAL HX:  Social History   Tobacco Use  . Smoking status: Never Smoker  . Smokeless tobacco: Never Used  Substance Use Topics  . Alcohol use: Never   FAMILY HX:  Family History  Problem Relation Age of Onset  . Dementia Mother 835   ALLERGIES:  Allergies  Allergen Reactions  . Aricept [Donepezil Hcl] Diarrhea     PERTINENT MEDICATIONS:  Outpatient Encounter Medications as of 12/17/2020  Medication Sig  . Multiple Vitamin (MULTIVITAMIN WITH MINERALS) TABS tablet Take 1 tablet by mouth daily.  . rosuvastatin (CRESTOR) 10 MG tablet Take 1 tablet (10 mg total) by mouth daily.   No facility-administered encounter medications on file as of 12/17/2020.    Thank you for the opportunity to participate in the care of Stephanie Acevedo The palliative care team will continue to follow. Please call our office at [phone] we can be of additional assistance.  QJari Favre DNP, AGPCNP-BC

## 2020-12-26 ENCOUNTER — Ambulatory Visit: Payer: Medicare Other | Attending: Internal Medicine

## 2020-12-26 ENCOUNTER — Other Ambulatory Visit: Payer: Self-pay

## 2020-12-26 DIAGNOSIS — Z23 Encounter for immunization: Secondary | ICD-10-CM

## 2020-12-26 NOTE — Progress Notes (Signed)
   Covid-19 Vaccination Clinic  Name:  Bryttney Netzer    MRN: [id number] DOB: 07-09-1946  12/26/2020  Ms. Fitting was observed post Covid-19 immunization for 15 minutes without incident. She was provided with Vaccine Information Sheet and instruction to access the V-Safe system.   Ms. Freiberger was instructed to call 911 with any severe reactions post vaccine: Marland Kitchen Difficulty breathing  . Swelling of face and throat  . A fast heartbeat  . A bad rash all over body  . Dizziness and weakness   Immunizations Administered    Name Date Dose VIS Date Route   PFIZER Comrnaty(Gray TOP) Covid-19 Vaccine 12/26/2020  9:56 AM 0.3 mL 08/22/2020 Intramuscular   Manufacturer: [employer], [employer]   Lot: [id number]   NDC: [phone]

## 2020-12-27 ENCOUNTER — Other Ambulatory Visit (HOSPITAL_BASED_OUTPATIENT_CLINIC_OR_DEPARTMENT_OTHER): Payer: Self-pay

## 2020-12-27 MED ORDER — COVID-19 MRNA VACCINE (PFIZER) 30 MCG/0.3ML IM SUSP
INTRAMUSCULAR | 0 refills | Status: DC
  Filled 2020-12-27: qty 0.3, 1d supply, fill #0

## 2020-12-31 DIAGNOSIS — H353131 Nonexudative age-related macular degeneration, bilateral, early dry stage: Secondary | ICD-10-CM | POA: Diagnosis not present

## 2021-01-30 ENCOUNTER — Other Ambulatory Visit: Payer: Medicare Other | Admitting: Nurse Practitioner

## 2021-01-30 ENCOUNTER — Other Ambulatory Visit: Payer: Self-pay

## 2021-01-30 VITALS — BP 122/66 | HR 95 | Resp 16 | Ht 66.0 in | Wt 121.0 lb

## 2021-01-30 DIAGNOSIS — F0391 Unspecified dementia with behavioral disturbance: Secondary | ICD-10-CM | POA: Diagnosis not present

## 2021-01-30 DIAGNOSIS — Z515 Encounter for palliative care: Secondary | ICD-10-CM | POA: Diagnosis not present

## 2021-01-30 DIAGNOSIS — G4700 Insomnia, unspecified: Secondary | ICD-10-CM | POA: Diagnosis not present

## 2021-01-30 NOTE — Progress Notes (Signed)
[facility] Consult Note Telephone: [phone]  Fax: [phone]    Date of encounter: 01/30/21 PATIENT NAME: Stephanie Acevedo [place] [address]   [phone] (home)  DOB: May 04, 1946 MRN: [id number]  PRIMARY CARE PROVIDER:    Yvonna Alanis, NP,  [address] [address] [phone]  REFERRING PROVIDER:   Yvonna Alanis, NP [phone],  [place] [address] [phone]  RESPONSIBLE PARTY:    Contact Information    Name Relation Home Work [address]   Stephanie, Acevedo [facility] [phone]  [phone]   Lemay Daughter   [phone]     I met face to face with patient and her husband in home. Palliative Care was asked to follow this patient by consultation request of  Yvonna Alanis, NP to address advance care planning and complex medical decision making. This is a follow up visit.                                  ASSESSMENT AND PLAN / RECOMMENDATIONS:   Advance Care Planning/Goals of Care:  CODE STATUS: DNR Goal of care: Goal of care is comfort while preserving function. Directive: Signed DNR and MOST form present in home. Details of the MOST form include; comfort measures, determine use or limitation of antibiotics when infection occurs, IV fluids for a defined trial period, no feeding tube.  Symptom Management/Plan: Insomnia: Continue Melatonin 74m by mouth daily, may consider changing Gummies formulation if difficulty with tablets.  Dementia: Dementia with behavior concerns. Husband report patient more forgetful and struggles with following direction. Report patient continual issues with visual and audio hallucination, often talking loudly to herself. Husband does not want any medication intervention for this symptoms at this time, saying she and patient are management well. Patient continues to be independent with her ADLs, ambulates without an assistive device, no report of falls.  Continue supportive care. Provided general support and encouragement. Questions and concerns were addressed. Patient's husband was encouraged to call with questions and/or concerns.  Follow up Palliative Care Visit: Palliative care will continue to follow for complex medical decision making, advance care planning, and clarification of goals. Return in about 6-8 weeks or prn.  PPS: 70%  HOSPICE ELIGIBILITY/DIAGNOSIS: TBD  CHIEF COMPLAIN: Follow up on insomnia  History obtained from review of Epic EMR, discussion with patient and her husband.    HISTORY OF PRESENT ILLNESS:  BJadyn Brasheris a 75y.o. year old female with multiple medical problems including Dementia with behavior disturbance (FAST 5), type 2 diabetes diet controlled.  This a follow up visit on insomnia. Patient husband report improvement in patient's insomnia in the last month since starting patient on Melatonin 547mtablet at bedtime, report patient sleeping more through the night. Husband also report improvement in patient's mood, saying patient had only one episode of her crying spells. Husband voiced no other concerns for patient today. No report of fever or chills, no report of uncontrolled pain. Ten systems reviewed and are negative for acute change, except as noted in the HPI.   Physical Exam: General: frail appearing, thin, cooperative EYES: anicteric sclera, no discharge  ENMT: intact hearing, oral mucous membranes moist CV: RRR, no LE edema Pulmonary: no increased work of breathing, no cough, no audible wheezes, room air Abdomen:no ascites GU: deferred MSJSE:GBTDVll extremities, ambulatory Skin: warm and dry, no rashes or wounds  on visible skin Neuro: Generalized weakness,otherwise no focal Psych: non-anxious affecttoday, A and O x 1 Hem/lymph/immuno: no widespread bruising  Past Medical History:  Diagnosis Date  . Dementia ([place])   . Diabetes mellitus without complication ([place])   . Hyperlipidemia   .  Hypokalemia   . Torsades de pointes ([place])   . Ventricular tachycardia, non-sustained ([place])    Thank you for the opportunity to participate in the care of Ms. Beharry.  The palliative care team will continue to follow. Please call our office at [phone] if we can be of additional assistance.   Alba Destine, NP DNP,AGPCNP-BC  COVID-19 PATIENT SCREENING TOOL Asked and negative response unless otherwise noted:   Have you had symptoms of covid, tested positive or been in contact with someone with symptoms/positive test in the past 5-10 days?

## 2021-02-04 DIAGNOSIS — H353131 Nonexudative age-related macular degeneration, bilateral, early dry stage: Secondary | ICD-10-CM | POA: Diagnosis not present

## 2021-08-14 ENCOUNTER — Ambulatory Visit (INDEPENDENT_AMBULATORY_CARE_PROVIDER_SITE_OTHER): Payer: Medicare Other | Admitting: Orthopedic Surgery

## 2021-08-14 ENCOUNTER — Encounter: Payer: Self-pay | Admitting: Orthopedic Surgery

## 2021-08-14 ENCOUNTER — Other Ambulatory Visit: Payer: Self-pay

## 2021-08-14 VITALS — BP 100/70 | HR 70 | Temp 97.1°F | Resp 16 | Ht 66.0 in | Wt 117.2 lb

## 2021-08-14 DIAGNOSIS — F0390 Unspecified dementia without behavioral disturbance: Secondary | ICD-10-CM | POA: Diagnosis not present

## 2021-08-14 DIAGNOSIS — E1169 Type 2 diabetes mellitus with other specified complication: Secondary | ICD-10-CM | POA: Diagnosis not present

## 2021-08-14 DIAGNOSIS — R636 Underweight: Secondary | ICD-10-CM

## 2021-08-14 DIAGNOSIS — R238 Other skin changes: Secondary | ICD-10-CM

## 2021-08-14 DIAGNOSIS — E785 Hyperlipidemia, unspecified: Secondary | ICD-10-CM

## 2021-08-14 DIAGNOSIS — E1142 Type 2 diabetes mellitus with diabetic polyneuropathy: Secondary | ICD-10-CM | POA: Diagnosis not present

## 2021-08-14 DIAGNOSIS — I4729 Other ventricular tachycardia: Secondary | ICD-10-CM | POA: Diagnosis not present

## 2021-08-14 DIAGNOSIS — Z681 Body mass index (BMI) 19 or less, adult: Secondary | ICD-10-CM

## 2021-08-14 DIAGNOSIS — E782 Mixed hyperlipidemia: Secondary | ICD-10-CM | POA: Diagnosis not present

## 2021-08-14 MED ORDER — ROSUVASTATIN CALCIUM 10 MG PO TABS: 10.0000 mg | ORAL_TABLET | Freq: Every day | ORAL | 1 refills | Status: DC

## 2021-08-14 NOTE — Progress Notes (Signed)
Careteam: Patient Care Team: Octavia Heir, NP as PCP - General (Adult Health Nurse Practitioner)  Seen by: Hazle Nordmann, AGNP-C  PLACE OF SERVICE:  [facility]  Advanced Directive information Does Patient Have a Medical Advance Directive?: Yes, Type of Advance Directive: Healthcare Power of [place];Living will, Does patient want to make changes to medical advance directive?: No - Patient declined  Allergies  Allergen Reactions   Aricept [Donepezil Hcl] Diarrhea    Chief Complaint  Patient presents with   Medical Management of Chronic Issues    9 month follow up.   Health Maintenance    Discuss the need for Urine Microalbumin, Foot Exam, Hemoglobin A1C, Cologaurd, Eye Exam, Mammogram, and Dexa Scan,   Immunizations    Discuss the need for Tetanus vaccine, Shingrix vaccine, 3rd Covid Booster, Pne vaccine.      HPI: Patient is a 74 y.o. female seen today for medical management of chronic conditions.   Husband present during encounter.   Husband reports her dementia has progressed. She is having trouble remembering her last name. Cannot complete sentences. Denies behavioral issues. Sleeping during the night. Palliative care is not coming anymore. Crying spells have subsided.   Husband reports she is never left on her own.   Eating about 2 meals daily. Drinking one Ensure as well.   Exercising about 30 minutes for 4-5 times weekly.   No recent falls or injuries. Ambulating on her own without difficulty.   Discussed goal of care. Husband does not wish to have additional screening tests at this time.     Review of Systems:  Review of Systems  Unable to perform ROS: Dementia   Past Medical History:  Diagnosis Date   Dementia (HCC)    Diabetes mellitus without complication (HCC)    Hyperlipidemia    Hypokalemia    Torsades de pointes    Ventricular tachycardia, non-sustained    History reviewed. No pertinent surgical history. Social History:   reports that she  has never smoked. She has never used smokeless tobacco. She reports that she does not drink alcohol and does not use drugs.  Family History  Problem Relation Age of Onset   Dementia Mother 27    Medications: Patient's Medications  New Prescriptions   No medications on file  Previous Medications   MULTIPLE VITAMIN (MULTIVITAMIN WITH MINERALS) TABS TABLET    Take 1 tablet by mouth daily.   ROSUVASTATIN (CRESTOR) 10 MG TABLET    Take 1 tablet (10 mg total) by mouth daily.  Modified Medications   No medications on file  Discontinued Medications   COVID-19 MRNA VACCINE, PFIZER, 30 MCG/0.3ML INJECTION    Inject into the muscle.    Physical Exam:  Vitals:   08/14/21 0933  BP: 100/70  Pulse: 70  Resp: 16  Temp: (!) 97.1 F (36.2 C)  SpO2: 98%  Weight: 117 lb 3.2 oz (53.2 kg)  Height: 5\' 6"  (1.676 m)   Body mass index is 18.92 kg/m. Wt Readings from Last 3 Encounters:  08/14/21 117 lb 3.2 oz (53.2 kg)  01/30/21 121 lb (54.9 kg)  11/21/20 114 lb 3.2 oz (51.8 kg)    Physical Exam Vitals reviewed.  Constitutional:      General: She is not in acute distress. HENT:     Head: Normocephalic.  Eyes:     General:        Right eye: No discharge.        Left eye: No discharge.  Neck:     Vascular: No carotid bruit.  Cardiovascular:     Rate and Rhythm: Normal rate and regular rhythm.     Pulses: Normal pulses.     Heart sounds: Normal heart sounds.  Pulmonary:     Effort: Pulmonary effort is normal.     Breath sounds: Normal breath sounds.  Abdominal:     General: Abdomen is flat. Bowel sounds are normal. There is no distension.     Palpations: Abdomen is soft.     Tenderness: There is no abdominal tenderness.  Musculoskeletal:     Cervical back: Normal range of motion.     Right lower leg: No edema.     Left lower leg: No edema.  Feet:     Right foot:     Protective Sensation: 10 sites tested.  10 sites sensed.     Skin integrity: Dry skin present. No skin  breakdown.     Toenail Condition: Right toenails are long.     Left foot:     Protective Sensation: 10 sites tested.  10 sites sensed.     Skin integrity: Dry skin present. No skin breakdown.     Toenail Condition: Left toenails are long.  Lymphadenopathy:     Cervical: No cervical adenopathy.  Skin:    General: Skin is warm and dry.     Capillary Refill: Capillary refill takes less than 2 seconds.     Comments: Flaking skin on extremities  Neurological:     General: No focal deficit present.     Mental Status: She is alert and oriented to person, place, and time.  Psychiatric:        Mood and Affect: Mood normal. Affect is flat.        Behavior: Behavior normal.        Cognition and Memory: Memory is impaired.     Comments: Very pleasant, follow commands, cannot complete sentence    Labs reviewed: Basic Metabolic Panel: Recent Labs    08/19/20 0804 11/21/20 0850  NA 141 140  K 4.0 4.3  CL 107 105  CO2 24 23  GLUCOSE 139* 151*  BUN 23 19  CREATININE 0.85 0.90  CALCIUM 9.5 9.7  MG 2.2  --    Liver Function Tests: No results for input(s): AST, ALT, ALKPHOS, BILITOT, PROT, ALBUMIN in the last 8760 hours. No results for input(s): LIPASE, AMYLASE in the last 8760 hours. No results for input(s): AMMONIA in the last 8760 hours. CBC: No results for input(s): WBC, NEUTROABS, HGB, HCT, MCV, PLT in the last 8760 hours. Lipid Panel: Recent Labs    11/21/20 0850  CHOL 260*  HDL 95  LDLCALC 134*  TRIG 169*  CHOLHDL 2.7   TSH: No results for input(s): TSH in the last 8760 hours. A1C: Lab Results  Component Value Date   HGBA1C 7.1 (H) 11/21/2020     Assessment/Plan 1. Hyperlipidemia associated with type 2 diabetes mellitus (HCC) - LDL 111 08/05/2020, goal < 70 - cont to limit fried foods and processed foods - rosuvastatin (CRESTOR) 10 MG tablet; Take 1 tablet (10 mg total) by mouth daily.  Dispense: 90 tablet; Refill: 1 - Lipid Panel  2. Controlled type 2  diabetes mellitus with diabetic polyneuropathy, without long-term current use of insulin (HCC) - A1c 7.1 11/21/2020 - not on medication due to advanced dementia - no recent hypoglycemic events - Hemoglobin A1c - CBC with Differential/Platelet - CMP  3. NSVT (nonsustained ventricular tachycardia) - failed  trial of metoprolol in past - not on medication at this time  4. Dementia without behavioral disturbance (HCC) - MMSE 11/30 11/27/2019 - husband does not want aggressive treatments/screenings  - no recent behavioral outbursts - follows commands - ambulating well - not seen by Palliative anymore  5. Underweight - weight increased 3 lbs since last visit - eating 2 meals daily - cont Ensure supplement daily  6. Body mass index (BMI) of 19 or less in adult - see above  7. Dry scalp - scalp flaking - recommend purchasing Nizoral/ ketoconazole shampoo- use 1-2 times weekly  Total time: 31 minutes. Greater than 50% of total time spent doing patient education regarding dementia and goals of care.   Labs/tests: Discuss pneumonia vaccine next visit   Next appt: Visit date not found  [place] Ran  [facility] [phone]

## 2021-08-14 NOTE — Patient Instructions (Signed)
Tetanus vaccine at local pharmacy  Ketoconazole shampoo- Niorzal  Apply lotion to skin after bathing- may also use vaseline

## 2021-08-15 ENCOUNTER — Other Ambulatory Visit: Payer: Self-pay | Admitting: Orthopedic Surgery

## 2021-08-15 DIAGNOSIS — E785 Hyperlipidemia, unspecified: Secondary | ICD-10-CM

## 2021-08-15 DIAGNOSIS — E1169 Type 2 diabetes mellitus with other specified complication: Secondary | ICD-10-CM

## 2021-08-15 LAB — CBC WITH DIFFERENTIAL/PLATELET
Absolute Monocytes: 337 cells/uL (ref 200–950)
Basophils Absolute: 79 cells/uL (ref 0–200)
Basophils Relative: 1.2 %
Eosinophils Absolute: 79 cells/uL (ref 15–500)
Eosinophils Relative: 1.2 %
HCT: 38.7 % (ref 35.0–45.0)
Hemoglobin: 13.3 g/dL (ref 11.7–15.5)
Lymphs Abs: 1729 cells/uL (ref 850–3900)
MCH: 30 pg (ref 27.0–33.0)
MCHC: 34.4 g/dL (ref 32.0–36.0)
MCV: 87.4 fL (ref 80.0–100.0)
MPV: 9.9 fL (ref 7.5–12.5)
Monocytes Relative: 5.1 %
Neutro Abs: 4376 cells/uL (ref 1500–7800)
Neutrophils Relative %: 66.3 %
Platelets: 313 10*3/uL (ref 140–400)
RBC: 4.43 10*6/uL (ref 3.80–5.10)
RDW: 12.5 % (ref 11.0–15.0)
Total Lymphocyte: 26.2 %
WBC: 6.6 10*3/uL (ref 3.8–10.8)

## 2021-08-15 LAB — LIPID PANEL
Cholesterol: 284 mg/dL — ABNORMAL HIGH (ref <200)
HDL: 72 mg/dL (ref >=50)
LDL Cholesterol (Calc): 185 mg/dL (calc) — ABNORMAL HIGH
Non-HDL Cholesterol (Calc): 212 mg/dL (calc) — ABNORMAL HIGH (ref <130)
Total CHOL/HDL Ratio: 3.9 (calc) (ref <5.0)
Triglycerides: 136 mg/dL (ref <150)

## 2021-08-15 LAB — HEMOGLOBIN A1C
Hgb A1c MFr Bld: 10.6 % of total Hgb — ABNORMAL HIGH (ref <5.7)
Mean Plasma Glucose: 258 mg/dL
eAG (mmol/L): 14.3 mmol/L

## 2021-08-15 LAB — COMPREHENSIVE METABOLIC PANEL
AG Ratio: 1.3 (calc) (ref 1.0–2.5)
ALT: 7 U/L (ref 6–29)
AST: 12 U/L (ref 10–35)
Albumin: 3.9 g/dL (ref 3.6–5.1)
Alkaline phosphatase (APISO): 79 U/L (ref 37–153)
BUN: 17 mg/dL (ref 7–25)
CO2: 27 mmol/L (ref 20–32)
Calcium: 9.5 mg/dL (ref 8.6–10.4)
Chloride: 104 mmol/L (ref 98–110)
Creat: 0.76 mg/dL (ref 0.60–1.00)
Globulin: 3 g/dL (calc) (ref 1.9–3.7)
Glucose, Bld: 251 mg/dL — ABNORMAL HIGH (ref 65–99)
Potassium: 4.2 mmol/L (ref 3.5–5.3)
Sodium: 139 mmol/L (ref 135–146)
Total Bilirubin: 0.4 mg/dL (ref 0.2–1.2)
Total Protein: 6.9 g/dL (ref 6.1–8.1)

## 2021-08-15 MED ORDER — ROSUVASTATIN CALCIUM 20 MG PO TABS: 20.0000 mg | ORAL_TABLET | Freq: Every day | ORAL | 3 refills | Status: DC

## 2021-08-18 NOTE — Progress Notes (Signed)
This encounter was created in error - please disregard.

## 2021-08-25 ENCOUNTER — Ambulatory Visit: Payer: Medicare Other | Admitting: Cardiology

## 2021-09-29 ENCOUNTER — Other Ambulatory Visit: Payer: Self-pay

## 2021-09-29 ENCOUNTER — Other Ambulatory Visit: Payer: Medicare Other

## 2021-09-29 ENCOUNTER — Other Ambulatory Visit: Payer: Self-pay | Admitting: Orthopedic Surgery

## 2021-09-29 DIAGNOSIS — E1169 Type 2 diabetes mellitus with other specified complication: Secondary | ICD-10-CM

## 2021-09-29 DIAGNOSIS — E785 Hyperlipidemia, unspecified: Secondary | ICD-10-CM

## 2021-09-29 LAB — LIPID PANEL
Cholesterol: 255 mg/dL — ABNORMAL HIGH (ref <200)
HDL: 83 mg/dL (ref >=50)
LDL Cholesterol (Calc): 142 mg/dL (calc) — ABNORMAL HIGH
Non-HDL Cholesterol (Calc): 172 mg/dL (calc) — ABNORMAL HIGH (ref <130)
Total CHOL/HDL Ratio: 3.1 (calc) (ref <5.0)
Triglycerides: 167 mg/dL — ABNORMAL HIGH (ref <150)

## 2021-09-29 MED ORDER — ROSUVASTATIN CALCIUM 20 MG PO TABS: 20.0000 mg | ORAL_TABLET | Freq: Every day | ORAL | 1 refills | Status: DC

## 2021-11-13 DIAGNOSIS — L821 Other seborrheic keratosis: Secondary | ICD-10-CM | POA: Diagnosis not present

## 2021-11-13 DIAGNOSIS — D235 Other benign neoplasm of skin of trunk: Secondary | ICD-10-CM | POA: Diagnosis not present

## 2021-11-13 DIAGNOSIS — D2372 Other benign neoplasm of skin of left lower limb, including hip: Secondary | ICD-10-CM | POA: Diagnosis not present

## 2021-11-13 DIAGNOSIS — Z85828 Personal history of other malignant neoplasm of skin: Secondary | ICD-10-CM | POA: Diagnosis not present

## 2021-11-27 ENCOUNTER — Other Ambulatory Visit: Payer: Self-pay

## 2021-11-27 ENCOUNTER — Other Ambulatory Visit: Payer: Medicare Other

## 2021-11-27 DIAGNOSIS — E1169 Type 2 diabetes mellitus with other specified complication: Secondary | ICD-10-CM | POA: Diagnosis not present

## 2021-11-27 DIAGNOSIS — E785 Hyperlipidemia, unspecified: Secondary | ICD-10-CM | POA: Diagnosis not present

## 2021-11-28 LAB — LIPID PANEL
Cholesterol: 161 mg/dL (ref <200)
HDL: 78 mg/dL (ref >=50)
LDL Cholesterol (Calc): 65 mg/dL (calc)
Non-HDL Cholesterol (Calc): 83 mg/dL (calc) (ref <130)
Total CHOL/HDL Ratio: 2.1 (calc) (ref <5.0)
Triglycerides: 96 mg/dL (ref <150)

## 2021-12-04 ENCOUNTER — Other Ambulatory Visit: Payer: Self-pay

## 2021-12-04 ENCOUNTER — Ambulatory Visit: Payer: Medicare Other | Admitting: Nurse Practitioner

## 2021-12-04 ENCOUNTER — Encounter: Payer: Self-pay | Admitting: Nurse Practitioner

## 2021-12-04 DIAGNOSIS — Z0000 Encounter for general adult medical examination without abnormal findings: Secondary | ICD-10-CM | POA: Diagnosis not present

## 2021-12-04 NOTE — Patient Instructions (Addendum)
Stephanie Acevedo , ?Thank you for taking time to come for your Medicare Wellness Visit. I appreciate your ongoing commitment to your health goals. Please review the following plan we discussed and let me know if I can assist you in the future.  ? ?Screening recommendations/referrals as below- you have decline additional screening at this time ?Colonoscopy  ?Mammogram  ?Bone Density  ?Recommended yearly ophthalmology/optometry visit for glaucoma screening and checkup ?Recommended yearly dental visit for hygiene and checkup ? ?Vaccinations: ?Influenza vaccine up to date    ?Pneumococcal vaccine up to date ?Tdap vaccine DUE- recommend to get at your local pharmacy    ?Shingles vaccine DUE- recommend to get at your local pharmacy      ? ?Advanced directives: on file ? ?Conditions/risks identified: progressive memory loss, progressive weight loss   ? ?Next appointment: yearly for awv ? ? ?Preventive Care 27 Years and Older, Female ?Preventive care refers to lifestyle choices and visits with your health care provider that can promote health and wellness. ?What does preventive care include? ?A yearly physical exam. This is also called an annual well check. ?Dental exams once or twice a year. ?Routine eye exams. Ask your health care provider how often you should have your eyes checked. ?Personal lifestyle choices, including: ?Daily care of your teeth and gums. ?Regular physical activity. ?Eating a healthy diet. ?Avoiding tobacco and drug use. ?Limiting alcohol use. ?Practicing safe sex. ?Taking low-dose aspirin every day. ?Taking vitamin and mineral supplements as recommended by your health care provider. ?What happens during an annual well check? ?The services and screenings done by your health care provider during your annual well check will depend on your age, overall health, lifestyle risk factors, and family history of disease. ?Counseling  ?Your health care provider may ask you questions about your: ?Alcohol use. ?Tobacco  use. ?Drug use. ?Emotional well-being. ?Home and relationship well-being. ?Sexual activity. ?Eating habits. ?History of falls. ?Memory and ability to understand (cognition). ?Work and work Astronomer. ?Reproductive health. ?Screening  ?You may have the following tests or measurements: ?Height, weight, and BMI. ?Blood pressure. ?Lipid and cholesterol levels. These may be checked every 5 years, or more frequently if you are over 31 years old. ?Skin check. ?Lung cancer screening. You may have this screening every year starting at age 45 if you have a 30-pack-year history of smoking and currently smoke or have quit within the past 15 years. ?Fecal occult blood test (FOBT) of the stool. You may have this test every year starting at age 17. ?Flexible sigmoidoscopy or colonoscopy. You may have a sigmoidoscopy every 5 years or a colonoscopy every 10 years starting at age 68. ?Hepatitis C blood test. ?Hepatitis B blood test. ?Sexually transmitted disease (STD) testing. ?Diabetes screening. This is done by checking your blood sugar (glucose) after you have not eaten for a while (fasting). You may have this done every 1-3 years. ?Bone density scan. This is done to screen for osteoporosis. You may have this done starting at age 5. ?Mammogram. This may be done every 1-2 years. Talk to your health care provider about how often you should have regular mammograms. ?Talk with your health care provider about your test results, treatment options, and if necessary, the need for more tests. ?Vaccines  ?Your health care provider may recommend certain vaccines, such as: ?Influenza vaccine. This is recommended every year. ?Tetanus, diphtheria, and acellular pertussis (Tdap, Td) vaccine. You may need a Td booster every 10 years. ?Zoster vaccine. You may need this after age  60. ?Pneumococcal 13-valent conjugate (PCV13) vaccine. One dose is recommended after age 75. ?Pneumococcal polysaccharide (PPSV23) vaccine. One dose is recommended  after age 75. ?Talk to your health care provider about which screenings and vaccines you need and how often you need them. ?This information is not intended to replace advice given to you by your health care provider. Make sure you discuss any questions you have with your health care provider. ?Document Released: 09/27/2015 Document Revised: 05/20/2016 Document Reviewed: 07/02/2015 ?Elsevier Interactive Patient Education ? 2017 [employer] ? ?Fall Prevention in the Home ?Falls can cause injuries. They can happen to people of all ages. There are many things you can do to make your home safe and to help prevent falls. ?What can I do on the outside of my home? ?Regularly fix the edges of walkways and driveways and fix any cracks. ?Remove anything that might make you trip as you walk through a door, such as a raised step or threshold. ?Trim any bushes or trees on the path to your home. ?Use bright outdoor lighting. ?Clear any walking paths of anything that might make someone trip, such as rocks or tools. ?Regularly check to see if handrails are loose or broken. Make sure that both sides of any steps have handrails. ?Any raised decks and porches should have guardrails on the edges. ?Have any leaves, snow, or ice cleared regularly. ?Use sand or salt on walking paths during winter. ?Clean up any spills in your garage right away. This includes oil or grease spills. ?What can I do in the bathroom? ?Use night lights. ?Install grab bars by the toilet and in the tub and shower. Do not use towel bars as grab bars. ?Use non-skid mats or decals in the tub or shower. ?If you need to sit down in the shower, use a plastic, non-slip stool. ?Keep the floor dry. Clean up any water that spills on the floor as soon as it happens. ?Remove soap buildup in the tub or shower regularly. ?Attach bath mats securely with double-sided non-slip rug tape. ?Do not have throw rugs and other things on the floor that can make you trip. ?What can I do  in the bedroom? ?Use night lights. ?Make sure that you have a light by your bed that is easy to reach. ?Do not use any sheets or blankets that are too big for your bed. They should not hang down onto the floor. ?Have a firm chair that has side arms. You can use this for support while you get dressed. ?Do not have throw rugs and other things on the floor that can make you trip. ?What can I do in the kitchen? ?Clean up any spills right away. ?Avoid walking on wet floors. ?Keep items that you use a lot in easy-to-reach places. ?If you need to reach something above you, use a strong step stool that has a grab bar. ?Keep electrical cords out of the way. ?Do not use floor polish or wax that makes floors slippery. If you must use wax, use non-skid floor wax. ?Do not have throw rugs and other things on the floor that can make you trip. ?What can I do with my stairs? ?Do not leave any items on the stairs. ?Make sure that there are handrails on both sides of the stairs and use them. Fix handrails that are broken or loose. Make sure that handrails are as long as the stairways. ?Check any carpeting to make sure that it is firmly attached to the stairs. Fix  any carpet that is loose or worn. ?Avoid having throw rugs at the top or bottom of the stairs. If you do have throw rugs, attach them to the floor with carpet tape. ?Make sure that you have a light switch at the top of the stairs and the bottom of the stairs. If you do not have them, ask someone to add them for you. ?What else can I do to help prevent falls? ?Wear shoes that: ?Do not have high heels. ?Have rubber bottoms. ?Are comfortable and fit you well. ?Are closed at the toe. Do not wear sandals. ?If you use a stepladder: ?Make sure that it is fully opened. Do not climb a closed stepladder. ?Make sure that both sides of the stepladder are locked into place. ?Ask someone to hold it for you, if possible. ?Clearly mark and make sure that you can see: ?Any grab bars or  handrails. ?First and last steps. ?Where the edge of each step is. ?Use tools that help you move around (mobility aids) if they are needed. These include: ?Canes. ?Walkers. ?Scooters. ?Crutches. ?Turn on the light

## 2021-12-04 NOTE — Progress Notes (Signed)
This service is provided via telemedicine ? ?No vital signs collected/recorded due to the encounter was a telemedicine visit.  ? ?Location of patient (ex: home, work):  Home ? ?Patient consents to a telephone visit:  Yes, see encounter dated 12/04/2021 ? ?Location of the provider (ex: office, home):  [employer] ? ?Name of any referring provider:  Hazle Nordmann, NP ? ?Names of all persons participating in the telemedicine service and their role in the encounter:  Abbey Chatters, Nurse Practitioner, Elveria Royals, CMA, and patient.  ? ?Time spent on call:  9 minutes with medical assistant ? ?

## 2021-12-04 NOTE — Progress Notes (Signed)
? ?Subjective:  ? Stephanie Acevedo is a 76 y.o. female who presents for Medicare Annual (Subsequent) preventive examination. ? ?Review of Systems    ? ?Cardiac Risk Factors include: advanced age (>34men, >54 women) ? ?   ?Objective:  ?  ?There were no vitals filed for this visit. ?There is no height or weight on file to calculate BMI. ? ? ?  12/04/2021  ?  3:17 PM 08/14/2021  ?  9:25 AM 11/28/2020  ? 11:16 AM 11/21/2020  ?  8:00 AM 08/22/2020  ?  8:32 AM 08/05/2020  ?  9:03 AM 08/02/2020  ? 10:48 AM  ?Advanced Directives  ?Does Patient Have a Medical Advance Directive? Yes Yes Yes Yes Yes Yes Yes  ?Type of Estate agent of [place];Living will;Out of facility DNR (pink MOST or yellow form) Healthcare Power of [place];Living will Healthcare Power of [place];Living will;Out of facility DNR (pink MOST or yellow form) Healthcare Power of [place];Living will;Out of facility DNR (pink MOST or yellow form) Healthcare Power of [place];Living will Healthcare Power of [place];Living will Healthcare Power of [place];Living will  ?Does patient want to make changes to medical advance directive? No - Patient declined No - Patient declined No - Patient declined No - Patient declined No - Patient declined No - Patient declined No - Patient declined  ?Copy of Healthcare Power of Attorney in Chart? Yes - validated most recent copy scanned in chart (See row information) Yes - validated most recent copy scanned in chart (See row information) Yes - validated most recent copy scanned in chart (See row information) Yes - validated most recent copy scanned in chart (See row information) Yes - validated most recent copy scanned in chart (See row information) Yes - validated most recent copy scanned in chart (See row information) No - copy requested  ?Pre-existing out of facility DNR order (yellow form or pink MOST form) Yellow form placed in chart (order not valid for inpatient use)  Yellow form placed in chart (order not  valid for inpatient use) Yellow form placed in chart (order not valid for inpatient use)     ? ? ?Current Medications (verified) ?Outpatient Encounter Medications as of 12/04/2021  ?Medication Sig  ? Multiple Vitamin (MULTIVITAMIN WITH MINERALS) TABS tablet Take 1 tablet by mouth daily.  ? rosuvastatin (CRESTOR) 20 MG tablet Take 1 tablet (20 mg total) by mouth daily.  ? ?No facility-administered encounter medications on file as of 12/04/2021.  ? ? ?Allergies (verified) ?Aricept [donepezil hcl]  ? ?History: ?Past Medical History:  ?Diagnosis Date  ? Dementia (HCC)   ? Diabetes mellitus without complication (HCC)   ? Hyperlipidemia   ? Hypokalemia   ? Torsades de pointes   ? Ventricular tachycardia, non-sustained   ? ?History reviewed. No pertinent surgical history. ?Family History  ?Problem Relation Age of Onset  ? Dementia Mother 66  ? ?Social History  ? ?Socioeconomic History  ? Marital status: Married  ?  Spouse name: Dorinda Hill   ? Number of children: 1  ? Years of education: 52  ? Highest education level: Bachelor's degree (e.g., BA, AB, BS)  ?Occupational History  ? Not on file  ?Tobacco Use  ? Smoking status: Never  ? Smokeless tobacco: Never  ?Vaping Use  ? Vaping Use: Never used  ?Substance and Sexual Activity  ? Alcohol use: Never  ? Drug use: Never  ? Sexual activity: Not Currently  ?Other Topics Concern  ? Not on file  ?Social History Narrative  ?  Diet:   ?   ? Do you drink/ eat things with caffeine?  Yes  ?   ? Marital status:  Married                             What year were you married ?  1970  ?   ? Do you live in a house, apartment,assistred living, condo, trailer, etc.)?  House  ?   ? Is it one or more stories?  No  ?   ? How many persons live in your home ?  2  ?   ? Do you have any pets in your home ?(please list) None  ?   ? Highest Level of education completed: College BS  ?   ? Current or past profession: Aeronautical engineerCustomer Service Manager  ?   ? Do you exercise?   Yes                           Type & how  often  Walking, Daily  ?   ? ADVANCED DIRECTIVES (Please bring copies)  ?   ? Do you have a living will? No  ?   ? Do you have a DNR form?                       If not, do you want to discuss one? Yes  ?   ? Do you have signed POA?HPOA forms?  Yes               If so, please bring to your appointment  ?   ? FUNCTIONAL STATUS- To be completed by Spouse / child / Staff   ?   ? Do you have difficulty bathing or dressing yourself ? No  ?   ? Do you have difficulty preparing food or eating ?  No  ?   ? Do you have difficulty managing your mediation ?No  ?   ? Do you have difficulty managing your finances ? Yes  ?   ? Do you have difficulty affording your medication ? No  ?   ? ?Social Determinants of Health  ? ?Financial Resource Strain: Not on file  ?Food Insecurity: Not on file  ?Transportation Needs: Not on file  ?Physical Activity: Not on file  ?Stress: Not on file  ?Social Connections: Not on file  ? ? ?Tobacco Counseling ?Counseling given: Not Answered ? ? ?Clinical Intake: ? ?Pre-visit preparation completed: Yes ? ?Pain : No/denies pain ? ?  ? ?BMI - recorded: 18 ?Nutritional Status: BMI <19  Underweight ?Nutritional Risks: Unintentional weight loss ?Diabetes: No ? ?How often do you need to have someone help you when you read instructions, pamphlets, or other written materials from your doctor or pharmacy?: 5 - Always ? ?Diabetic?yes ? ?  ? ?  ? ? ?Activities of Daily Living ? ?  12/04/2021  ?  3:39 PM  ?In your present state of health, do you have any difficulty performing the following activities:  ?Hearing? 0  ?Vision? 0  ?Difficulty concentrating or making decisions? 1  ?Walking or climbing stairs? 0  ?Dressing or bathing? 1  ?Comment husband helps  ?Doing errands, shopping? 1  ?Preparing Food and eating ? Y  ?Using the Toilet? N  ?In the past six months, have you accidently leaked urine? N  ?Do you have problems with loss  of bowel control? N  ?Managing your Medications? Y  ?Managing your Finances? Y   ?Housekeeping or managing your Housekeeping? Y  ? ? ?Patient Care Team: ?Octavia Heir, NP as PCP - General (Adult Health Nurse Practitioner) ? ?Indicate any recent Medical Services you may have received from other than [facility] providers in the past year (date may be approximate). ? ?   ?Assessment:  ? This is a routine wellness examination for [place]. ? ?Hearing/Vision screen ?Hearing Screening - Comments:: Patient does not have hearing loss ?Vision Screening - Comments:: Patient has no vision problems. Last eye exam a year ago. Patient sees Dr. Lovie Macadamia ? ?Dietary issues and exercise activities discussed: ?Current Exercise Habits: Home exercise routine, Type of exercise: walking, Time (Minutes): 20, Frequency (Times/Week): 7, Weekly Exercise (Minutes/Week): 140 ? ? Goals Addressed   ?None ?  ? ?Depression Screen ? ?  12/04/2021  ?  3:14 PM 11/28/2020  ? 11:14 AM 11/21/2020  ?  7:59 AM 08/22/2020  ?  8:32 AM 08/05/2020  ?  9:03 AM 04/04/2020  ?  3:08 PM 12/11/2019  ? 10:46 AM  ?PHQ 2/9 Scores  ?PHQ - 2 Score 0 0 0 0 0 0 0  ?  ?Fall Risk ? ?  12/04/2021  ?  3:16 PM 08/14/2021  ?  9:25 AM 11/28/2020  ? 11:15 AM 11/21/2020  ?  7:59 AM 08/22/2020  ?  8:32 AM  ?Fall Risk   ?Falls in the past year? 0 0 0 0 0  ?Number falls in past yr: 0 0 0 0 0  ?Injury with Fall? 0 0 0 0 0  ?Risk for fall due to : No Fall Risks No Fall Risks     ?Follow up Falls evaluation completed Falls evaluation completed     ? ? ?FALL RISK PREVENTION PERTAINING TO THE HOME: ? ?Any stairs in or around the home? No  ?If so, are there any without handrails? No  ?Home free of loose throw rugs in walkways, pet beds, electrical cords, etc? Yes  ?Adequate lighting in your home to reduce risk of falls? Yes  ? ?ASSISTIVE DEVICES UTILIZED TO PREVENT FALLS: ? ?Life alert?  no ?Use of a cane, walker or w/c? No  ?Grab bars in the bathroom? Yes  ?Shower chair or bench in shower? Yes  ?Elevated toilet seat or a handicapped toilet? No  ? ?TIMED UP AND GO: ? ?Was the test  performed? No .  ? ? ?Cognitive Function: ? ?  11/27/2019  ?  1:58 PM  ?MMSE - Mini Mental State Exam  ?Orientation to time 1  ?Orientation to Place 2.5  ?Registration 1  ?Attention/ Calculation 2.5  ?Recall 1  ?[place]

## 2021-12-16 ENCOUNTER — Telehealth: Payer: Self-pay | Admitting: *Deleted

## 2021-12-16 NOTE — Telephone Encounter (Signed)
Abagail Kitchens called and stated that patient received a jury duty summons and he needs a letter to have patient excused due to Dementia.  ? ?I told him that patient was over 72, to check the box and she should be excused and he stated that he did that and it was sent back to him and they stated that it was insufficient and needs a letter from Medical Provider.   ? ?Informed him to drop off summons and it would be given to Amy.  ?Agreed.  ?

## 2021-12-17 NOTE — Telephone Encounter (Signed)
Husband, Jadrian Santa, dropped off [employer]  ?Juror [id number] ?Summons 01/29/2022 [place].  ? ?Placed paperwork in Amy's folder to review and write letter.  ?To call Elenore Rota [phone] once ready for pick up.  ? ?

## 2021-12-17 NOTE — Telephone Encounter (Signed)
I will sign letter tomorrow. Thanks for letting me know.

## 2021-12-18 ENCOUNTER — Other Ambulatory Visit: Payer: Self-pay | Admitting: Orthopedic Surgery

## 2021-12-18 NOTE — Telephone Encounter (Signed)
Stephanie Acevedo, Husband Notified letter has been written and ready for pick up. Placed up front in Drawer.  ?

## 2021-12-29 IMAGING — CT CT HEAD W/O CM
4 series · 16 of 47 positions shown, 18 images · non-contrast
Comparison: 01/03/2020

CLINICAL DATA: Mental status change.

EXAM:
CT HEAD WITHOUT CONTRAST
TECHNIQUE: Contiguous axial images were obtained from the base of the skull
through the vertex without intravenous contrast.

[Series 1: head without · axial · non-contrast · 0.40mm/px · z∈[-99,+21]mm · 7 of 32 slices shown, 9 images]
[im 4/32  brain]
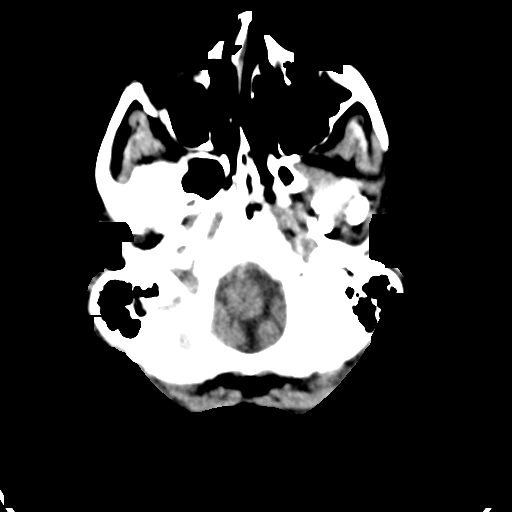
[im 4/32  bone]
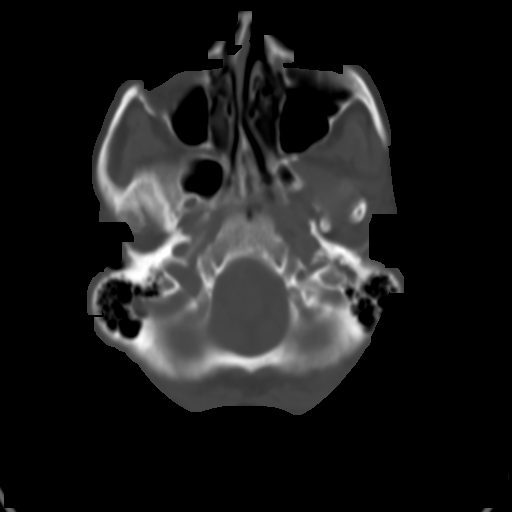
[im 8/32  brain]
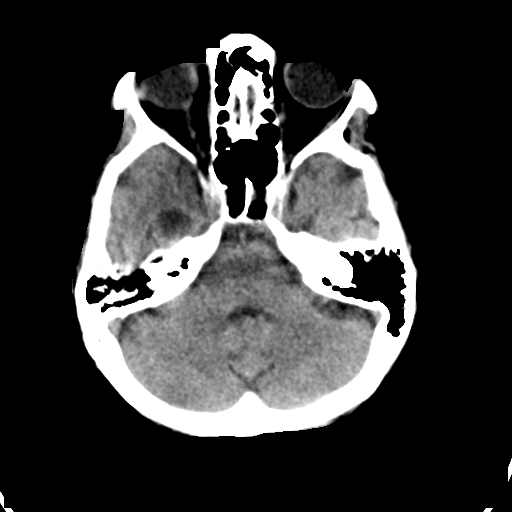
[im 12/32  brain]
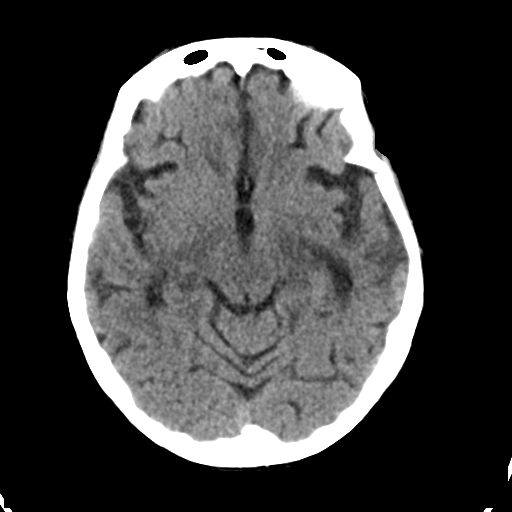
[im 16/32  brain]
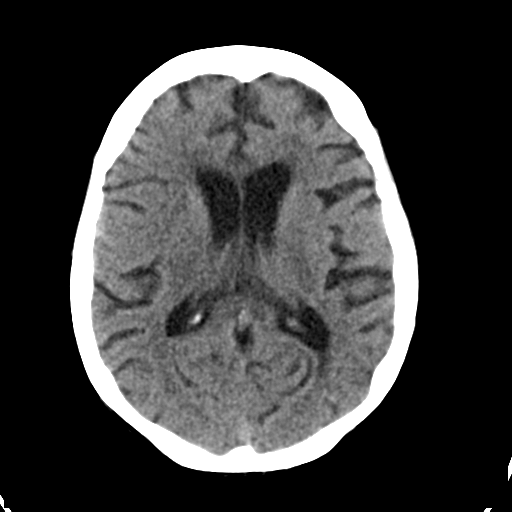
[im 20/32  brain]
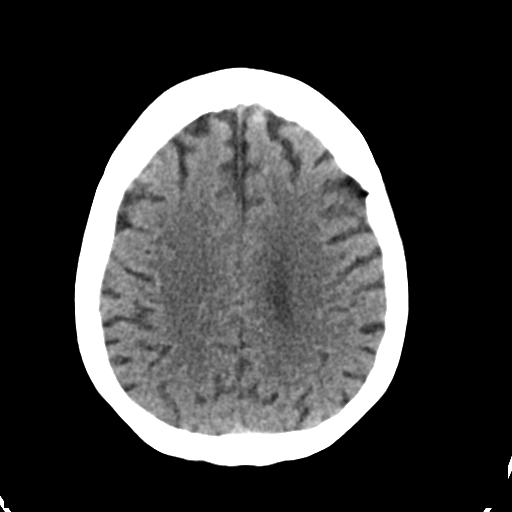
[im 20/32  bone]
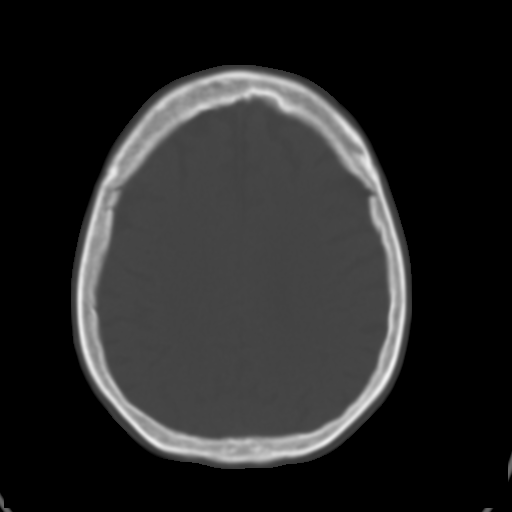
[im 24/32  brain]
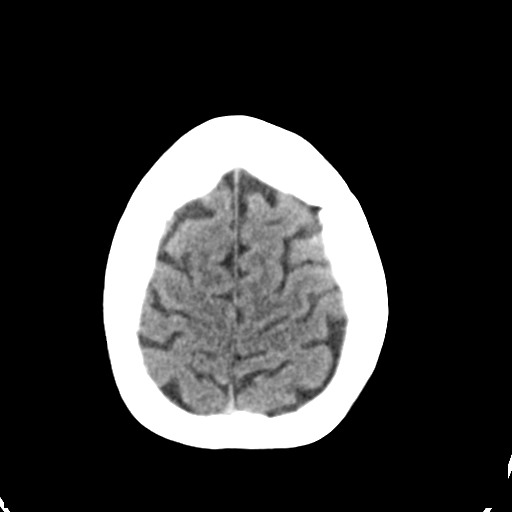
[im 28/32  brain]
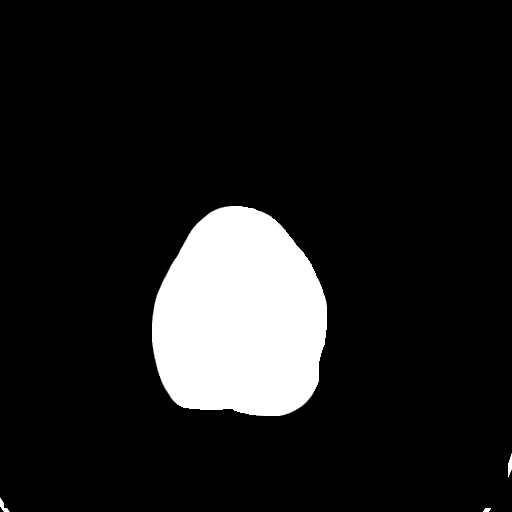

[Series 6: head bone · axial · 0.40mm/px · z∈[-100,-68]mm · 3 of 80 slices shown]
[im 8/80  bone]
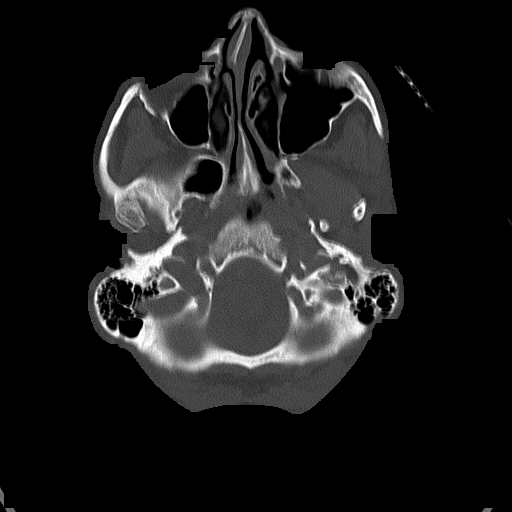
[im 16/80  bone]
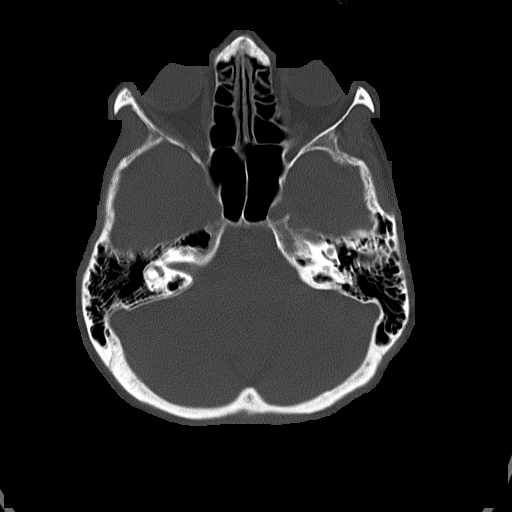
[im 24/80  bone]
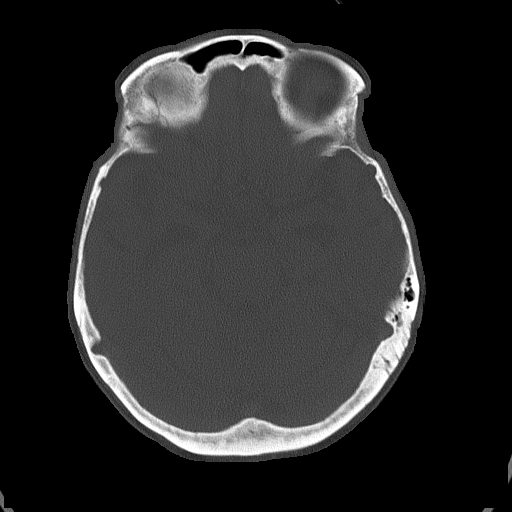

[Series 7: head without cor · coronal · non-contrast · 0.31mm/px · 3 of 67 slices shown]
[im 23/67  brain]
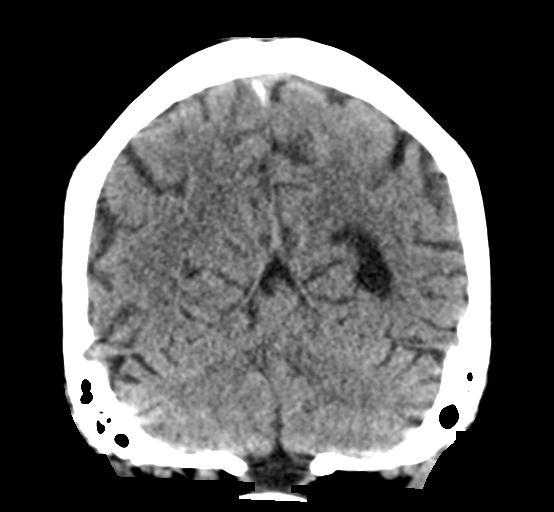
[im 30/67  brain]
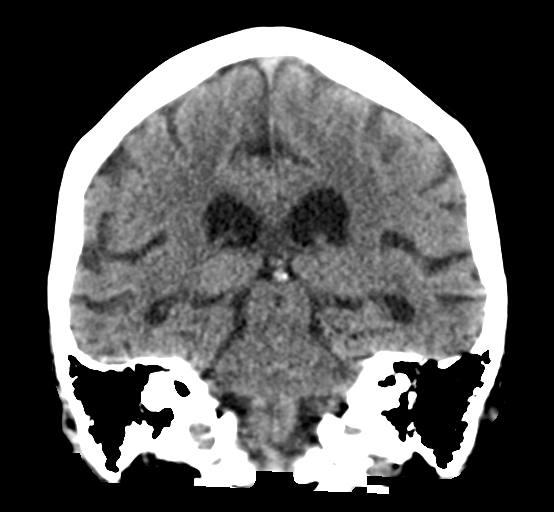
[im 37/67  brain]
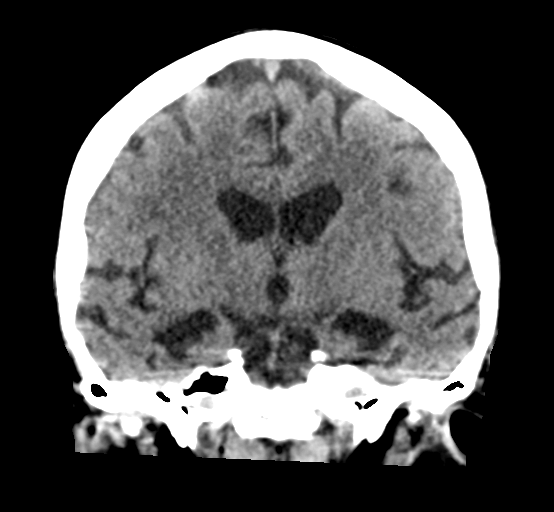

[Series 8: head without sag · sagittal · non-contrast · 0.31mm/px · 3 of 51 slices shown]
[im 17/51  brain]
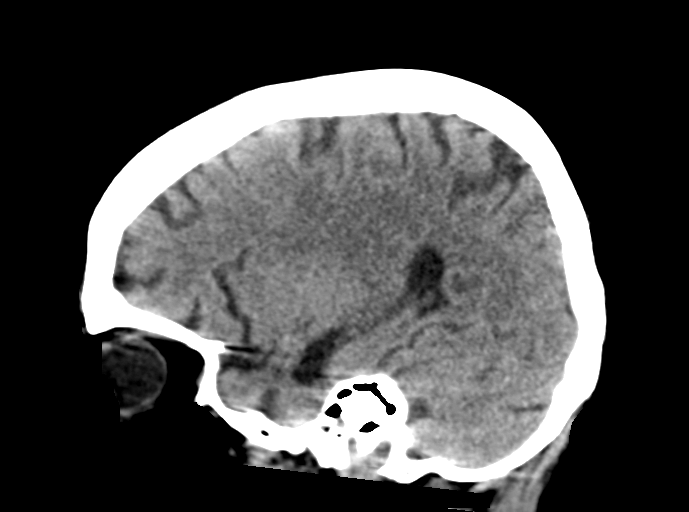
[im 26/51  brain]
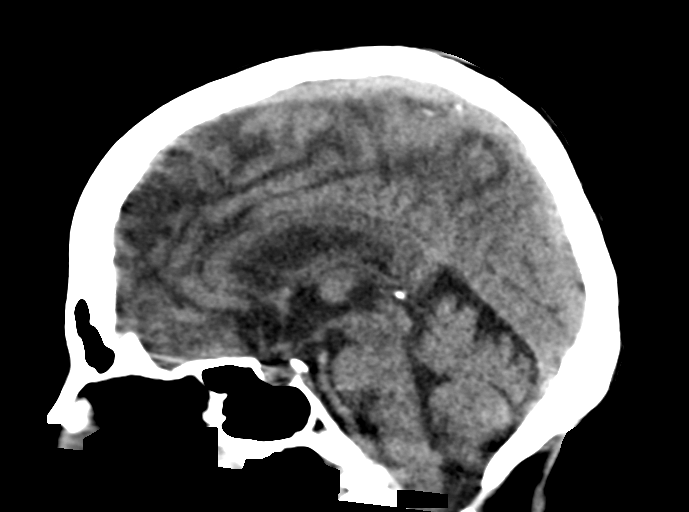
[im 34/51  brain]
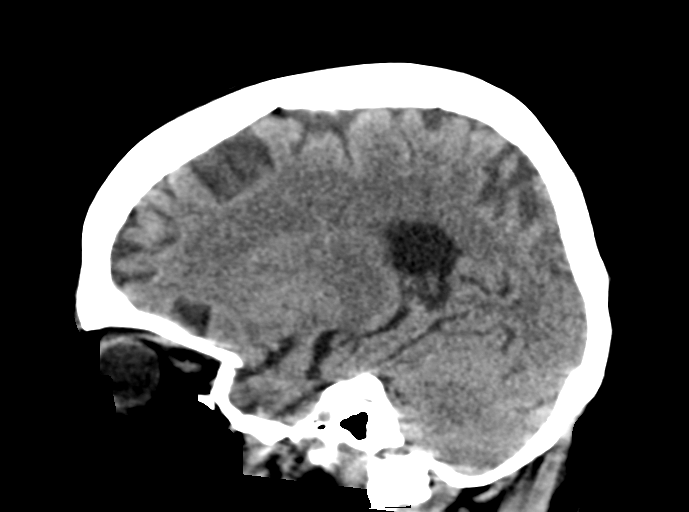

[16 of 47 positions shown; findings below may reference images not displayed]

FINDINGS: Brain: There is no evidence of an acute infarct, intracranial
hemorrhage, mass, midline shift, or extra-axial fluid collection.
Cerebral atrophy is unchanged with preferential involvement of the
mesial temporal lobes. Hypodensities in the cerebral white matter
bilaterally are unchanged and nonspecific but compatible with mild
chronic small vessel ischemic disease.

Vascular: Calcified atherosclerosis at the skull base. No hyperdense
vessel.

Skull: No fracture suspicious osseous lesion.

Sinuses/Orbits: Visualized paranasal sinuses and mastoid air cells
are clear. Unremarkable orbits.

Other: None.
IMPRESSION: 1. No evidence of acute intracranial abnormality.
2. Mild chronic small vessel ischemic disease and cerebral atrophy.

## 2022-02-10 ENCOUNTER — Telehealth: Payer: Self-pay | Admitting: Family Medicine

## 2022-02-10 DIAGNOSIS — Z515 Encounter for palliative care: Secondary | ICD-10-CM

## 2022-02-10 NOTE — Telephone Encounter (Signed)
Returned call to pt's spouse, left number for call back and encouraged him if visit needed this week to schedule with administrative team for tomorrow or Thursday or call me back.  Joycelyn Man FNP-C

## 2022-02-16 ENCOUNTER — Telehealth: Payer: Self-pay

## 2022-02-16 NOTE — Telephone Encounter (Signed)
Volunteer check in call made for palliative care, no answer 

## 2022-02-17 ENCOUNTER — Encounter: Payer: Self-pay | Admitting: Family Medicine

## 2022-02-17 ENCOUNTER — Other Ambulatory Visit: Payer: Medicare Other | Admitting: Family Medicine

## 2022-02-17 VITALS — BP 118/68 | HR 75 | Resp 20

## 2022-02-17 DIAGNOSIS — F0390 Unspecified dementia without behavioral disturbance: Secondary | ICD-10-CM

## 2022-02-17 DIAGNOSIS — Z636 Dependent relative needing care at home: Secondary | ICD-10-CM | POA: Diagnosis not present

## 2022-02-17 DIAGNOSIS — R636 Underweight: Secondary | ICD-10-CM

## 2022-02-17 DIAGNOSIS — Z515 Encounter for palliative care: Secondary | ICD-10-CM | POA: Diagnosis not present

## 2022-02-17 NOTE — Progress Notes (Unsigned)
Therapist, nutritional Palliative Care Consult Note Telephone: [phone]  Fax: [phone]    Date of encounter: 02/17/22 10:38 AM PATIENT NAME: Stephanie Acevedo [address] [place] [address]   [phone] (home)  DOB: [birth year] MRN: [id number] PRIMARY CARE PROVIDER:    Octavia Heir, NP,  [address] [place] [address] [phone]  REFERRING PROVIDER:   Octavia Heir, NP [phone] [address] [place],  [place] [address] [phone]  RESPONSIBLE PARTY:    Contact Information     Name Relation Home Work [address]   Stephanie Acevedo, Stephanie Acevedo [facility] [phone]  [phone]   Ziemann,Renee Daughter   [phone]        I met face to face with patient and spouse in their home. Palliative Care was asked to follow this patient by consultation request of  Stephanie Heir, NP to address advance care planning and complex medical decision making. This is a follow up visit  ASSESSMENT, SYMPTOM MANAGEMENT AND PLAN / RECOMMENDATIONS:  Dementia without behavioral disturbance PPS 60%.  FAST 7 score 6B Easily redirected at present. Did not tolerate Aricept, with massive diarrhea  Palliative Care Encounter Has DNR, created MOST  Underweight Difficulty at times keeping pt focused to eat a full meal but easily redirectable. Agree with Ensure supplement Use protein like beans, nuts, chees and protein powder in sauces/meals to supplement protein. Eat small, frequent meals.  Caregiver burden Starting to need assistance with caring for pt, home and farm SW referral to address paid caregiver resources      Advance Care Planning/Goals of Care: Goals include to maximize quality of life and symptom management. Health care surrogate gave his permission to discuss. Our advance care planning conversation included a discussion about:    The value and importance of advance care planning-has DNR in place Exploration of goals of care in the event of a sudden injury or  illness-DNR/DNI Identification of a healthcare agent-spouse Stephanie Acevedo Review and creation of an advance directive document-MOST form . Decision not to resuscitate or to de-escalate disease focused treatments due to poor prognosis.  CODE STATUS: MOST as of 02/17/2022: DNR/DNI with comfort measures Antibiotics, if indicated  IV fluids on a case by case, time limited basis No feeding tube.     Follow up Palliative Care Visit: Palliative care will continue to follow for complex medical decision making, advance care planning, and clarification of goals. Return 4-5 weeks or prn.  This visit was coded based on medical decision making (MDM).  PPS: 60%  HOSPICE ELIGIBILITY/DIAGNOSIS: TBD  Chief Complaint:  Palliative Care is following for chronic medical management of dementia.  HISTORY OF PRESENT ILLNESS:  Stephanie Acevedo is a 76 y.o. year old female with dementia, controlled DM, hx of NSVT/Torsades, HLD and being underweight.  Spouse reports appetite is good and she has lost a little weight.  Last night home scale 112 lbs. No falls, pain.  No coughing or choking after eating or drinking.  No pocketing foods.  No sob, nausea, vomiting, dysuria or constipation. Spouse picks out what she wears but dresses herself.  Continent of  bowel and bladder. She sleeps good most nights.  She wanders around his truck but does not wander away from home.  Will sleep some during the day if she has had a restless night.  He has no help but feels like he is getting to a point where he may need some help/support with her care.  Most of pt's speech is words  strung together.  There are times where she smiles and laughs appropriately.  She does not offer much to the conversation and will answer if asked a direct question but unclear if she truly understands what is being asked.  History obtained from review of EMR, discussion with spouse caregiver and/or Stephanie Acevedo.  I reviewed available labs, medications, imaging, studies  and related documents from the EMR.  Records reviewed and summarized above.   ROS Unable to obtain from pt due to advanced dementia.  Information obtained from spouse  Physical Exam: Current and past weights: home scale weight 02/17/22 112 lbs, weight as of 08/14/2021 was 117 lbs 3.2 ounces. Constitutional: NAD General: thin/underweight EYES: anicteric sclera, lids intact, no discharge  ENMT: intact hearing, oral mucous membranes moist, dentition intact CV: S1S2, RRR, no LE edema Pulmonary: CTAB, no increased work of breathing, no cough, room air Abdomen: normo-active BS + 4 quadrants, soft and non tender, no ascites GU: deferred MSK: no sarcopenia, moves all extremities, ambulatory Skin: warm and dry, no rashes or wounds on visible skin Neuro:  no generalized weakness, aphasia question if both receptive and expressive Psych: non-anxious affect, noted word salad, Appears content. A and oriented to name Hem/lymph/immuno: no widespread bruising   Thank you for the opportunity to participate in the care of Stephanie Acevedo.  The palliative care team will continue to follow. Please call our office at [phone] if we can be of additional assistance.   Marijo Conception, FNP-C  COVID-19 PATIENT SCREENING TOOL Asked and negative response unless otherwise noted:   Have you had symptoms of covid, tested positive or been in contact with someone with symptoms/positive test in the past 5-10 days?  No

## 2022-02-18 ENCOUNTER — Encounter: Payer: Self-pay | Admitting: Orthopedic Surgery

## 2022-02-18 DIAGNOSIS — Z636 Dependent relative needing care at home: Secondary | ICD-10-CM | POA: Insufficient documentation

## 2022-02-18 DIAGNOSIS — Z515 Encounter for palliative care: Secondary | ICD-10-CM | POA: Insufficient documentation

## 2022-02-19 ENCOUNTER — Ambulatory Visit (INDEPENDENT_AMBULATORY_CARE_PROVIDER_SITE_OTHER): Payer: Medicare Other | Admitting: Orthopedic Surgery

## 2022-02-19 VITALS — BP 112/60 | HR 73 | Temp 97.1°F | Ht 66.0 in | Wt 109.6 lb

## 2022-02-19 DIAGNOSIS — E785 Hyperlipidemia, unspecified: Secondary | ICD-10-CM

## 2022-02-19 DIAGNOSIS — F0390 Unspecified dementia without behavioral disturbance: Secondary | ICD-10-CM | POA: Diagnosis not present

## 2022-02-19 DIAGNOSIS — E1142 Type 2 diabetes mellitus with diabetic polyneuropathy: Secondary | ICD-10-CM | POA: Diagnosis not present

## 2022-02-19 DIAGNOSIS — R636 Underweight: Secondary | ICD-10-CM

## 2022-02-19 DIAGNOSIS — E1169 Type 2 diabetes mellitus with other specified complication: Secondary | ICD-10-CM | POA: Diagnosis not present

## 2022-02-19 DIAGNOSIS — I4729 Other ventricular tachycardia: Secondary | ICD-10-CM

## 2022-02-19 NOTE — Progress Notes (Signed)
Careteam: Patient Care Team: Stephanie Heir, NP as PCP - General (Adult Health Nurse Practitioner)  Seen by: Hazle Nordmann, AGNP-C  PLACE OF SERVICE:  [facility]  Advanced Directive information    Allergies  Allergen Reactions   Aricept [Donepezil Hcl] Diarrhea    Chief Complaint  Patient presents with   Medical Management of Chronic Issues    Patient presents today for a 6 month follow up.    Quality Metric Gaps    Discuss the need for TDAP, Shingrix, Pneumonia vaccine, eye exam, and Hemoglobin A1C, or post pone if patient refuses.      HPI: Patient is a 76 y.o. female seen today for medical management of chronic conditions.   Husband present during encounter.   Dementia- followed by palliative, MMSE 11/30 11/2019, lives at home,  husband caregiver, no behavioral outbursts, husband reports she is unable to complete tasks, does not talk much, cannot speak full sentence, unable to tolerate Aricept, ambulates without assistance, no recent falls or injuries, sleeping through the night  Underweight- BMI 17.69, eating 2 meals daily and one Ensure, quit drinking sugary drinks  HLD- LDL 65 11/27/2021, remains on Crestor   Review of Systems:  Review of Systems  Unable to perform ROS: Dementia    Past Medical History:  Diagnosis Date   Dementia (HCC)    Diabetes mellitus without complication (HCC)    Hyperlipidemia    Hypokalemia    Torsades de pointes (HCC)    Ventricular tachycardia, non-sustained (HCC)    History reviewed. No pertinent surgical history. Social History:   reports that she has never smoked. She has never used smokeless tobacco. She reports that she does not drink alcohol and does not use drugs.  Family History  Problem Relation Age of Onset   Dementia Mother 29    Medications: Patient's Medications  New Prescriptions   No medications on file  Previous Medications   MULTIPLE VITAMIN (MULTIVITAMIN WITH MINERALS) TABS TABLET    Take 1 tablet by  mouth daily.   ROSUVASTATIN (CRESTOR) 20 MG TABLET    Take 1 tablet (20 mg total) by mouth daily.  Modified Medications   No medications on file  Discontinued Medications   No medications on file    Physical Exam:  There were no vitals filed for this visit. There is no height or weight on file to calculate BMI. Wt Readings from Last 3 Encounters:  08/14/21 117 lb 3.2 oz (53.2 kg)  01/30/21 121 lb (54.9 kg)  11/21/20 114 lb 3.2 oz (51.8 kg)    Physical Exam Constitutional:      General: She is not in acute distress. HENT:     Head: Normocephalic.     Right Ear: There is no impacted cerumen.     Left Ear: There is no impacted cerumen.     Mouth/Throat:     Mouth: Mucous membranes are moist.  Eyes:     General:        Right eye: No discharge.        Left eye: No discharge.  Cardiovascular:     Rate and Rhythm: Normal rate and regular rhythm.     Pulses: Normal pulses.     Heart sounds: Normal heart sounds.  Pulmonary:     Effort: Pulmonary effort is normal. No respiratory distress.     Breath sounds: Normal breath sounds. No wheezing.  Abdominal:     General: Bowel sounds are normal. There is no distension.  Palpations: Abdomen is soft.     Tenderness: There is no abdominal tenderness.  Musculoskeletal:     Cervical back: Neck supple.     Right lower leg: No edema.     Left lower leg: No edema.  Skin:    General: Skin is warm and dry.     Capillary Refill: Capillary refill takes less than 2 seconds.  Neurological:     General: No focal deficit present.     Mental Status: She is alert. Mental status is at baseline.     Motor: No weakness.     Gait: Gait abnormal.     Comments: Shuffling gait  Psychiatric:        Mood and Affect: Mood normal.        Behavior: Behavior normal.        Cognition and Memory: Cognition is impaired. Memory is impaired.     Comments: Follows commands, spoke one word, alert to self and familiar face     Labs reviewed: Basic  Metabolic Panel: Recent Labs    08/14/21 1009  NA 139  K 4.2  CL 104  CO2 27  GLUCOSE 251*  BUN 17  CREATININE 0.76  CALCIUM 9.5   Liver Function Tests: Recent Labs    08/14/21 1009  AST 12  ALT 7  BILITOT 0.4  PROT 6.9   No results for input(s): "LIPASE", "AMYLASE" in the last 8760 hours. No results for input(s): "AMMONIA" in the last 8760 hours. CBC: Recent Labs    08/14/21 1009  WBC 6.6  NEUTROABS 4,376  HGB 13.3  HCT 38.7  MCV 87.4  PLT 313   Lipid Panel: Recent Labs    08/14/21 1009 09/29/21 0818 11/27/21 0822  CHOL 284* 255* 161  HDL 72 83 78  LDLCALC 185* 142* 65  TRIG 136 167* 96  CHOLHDL 3.9 3.1 2.1   TSH: No results for input(s): "TSH" in the last 8760 hours. A1C: Lab Results  Component Value Date   HGBA1C 10.6 (H) 08/14/2021     Assessment/Plan 1. Dementia without behavioral disturbance ([place]) - followed by palliative - gait shuffling, no behavioral outbursts - sleeping through night - recommend hydroxyzine prn if behaviors start - CBC with Differential/Platelet  2. Underweight - BMI 17.69 - down 8 lbs  - discussed increasing caloric intake - cont daily Ensure  3. Controlled type 2 diabetes mellitus with diabetic polyneuropathy, without long-term current use of insulin (HCC) - A1c 10.6 (12/01)> was 7.1 (03/10) - stopped drinking sugary drinks - husband does not want aggressive treatments - Hemoglobin A1c - CMP  4. Hyperlipidemia associated with type 2 diabetes mellitus (HCC) - LDL stable - cont Crestor - lipid panel- future  5. NSVT (nonsustained ventricular tachycardia) (HCC) - HR stable - failed trial of metoprolol in past  Total time: 31. Greater than 50% of total time spent doing patient education regarding health maintenance, dementia, falls safety, increasing caloric intake.    Next appt: none Aaryan Essman [place], [facility] [phone]

## 2022-02-19 NOTE — Patient Instructions (Signed)
Please call office if any concerns of sleeping/behaviors, we can try Hydroxyzine

## 2022-02-20 LAB — CBC WITH DIFFERENTIAL/PLATELET
Absolute Monocytes: 400 cells/uL (ref 200–950)
Basophils Absolute: 74 cells/uL (ref 0–200)
Basophils Relative: 1 %
Eosinophils Absolute: 111 cells/uL (ref 15–500)
Eosinophils Relative: 1.5 %
HCT: 38.7 % (ref 35.0–45.0)
Hemoglobin: 12.8 g/dL (ref 11.7–15.5)
Lymphs Abs: 1658 cells/uL (ref 850–3900)
MCH: 29.7 pg (ref 27.0–33.0)
MCHC: 33.1 g/dL (ref 32.0–36.0)
MCV: 89.8 fL (ref 80.0–100.0)
MPV: 10.5 fL (ref 7.5–12.5)
Monocytes Relative: 5.4 %
Neutro Abs: 5158 cells/uL (ref 1500–7800)
Neutrophils Relative %: 69.7 %
Platelets: 252 10*3/uL (ref 140–400)
RBC: 4.31 10*6/uL (ref 3.80–5.10)
RDW: 12.2 % (ref 11.0–15.0)
Total Lymphocyte: 22.4 %
WBC: 7.4 10*3/uL (ref 3.8–10.8)

## 2022-02-20 LAB — COMPREHENSIVE METABOLIC PANEL
AG Ratio: 1.4 (calc) (ref 1.0–2.5)
ALT: 19 U/L (ref 6–29)
AST: 20 U/L (ref 10–35)
Albumin: 4.2 g/dL (ref 3.6–5.1)
Alkaline phosphatase (APISO): 66 U/L (ref 37–153)
BUN: 19 mg/dL (ref 7–25)
CO2: 27 mmol/L (ref 20–32)
Calcium: 9.7 mg/dL (ref 8.6–10.4)
Chloride: 106 mmol/L (ref 98–110)
Creat: 0.81 mg/dL (ref 0.60–1.00)
Globulin: 3.1 g/dL (calc) (ref 1.9–3.7)
Glucose, Bld: 187 mg/dL — ABNORMAL HIGH (ref 65–99)
Potassium: 4.6 mmol/L (ref 3.5–5.3)
Sodium: 141 mmol/L (ref 135–146)
Total Bilirubin: 0.4 mg/dL (ref 0.2–1.2)
Total Protein: 7.3 g/dL (ref 6.1–8.1)

## 2022-02-20 LAB — HEMOGLOBIN A1C
Hgb A1c MFr Bld: 8 % of total Hgb — ABNORMAL HIGH (ref <5.7)
Mean Plasma Glucose: 183 mg/dL
eAG (mmol/L): 10.1 mmol/L

## 2022-03-18 ENCOUNTER — Encounter: Payer: Self-pay | Admitting: Family Medicine

## 2022-03-18 ENCOUNTER — Other Ambulatory Visit: Payer: Medicare Other | Admitting: Family Medicine

## 2022-03-18 VITALS — BP 102/62 | HR 82 | Resp 16

## 2022-03-18 DIAGNOSIS — Z636 Dependent relative needing care at home: Secondary | ICD-10-CM | POA: Diagnosis not present

## 2022-03-18 DIAGNOSIS — R636 Underweight: Secondary | ICD-10-CM

## 2022-03-18 NOTE — Progress Notes (Signed)
Therapist, nutritional Palliative Care Consult Note Telephone: [phone]  Fax: [phone]    Date of encounter: 03/18/22 10:14 AM PATIENT NAME: Stephanie Acevedo [address] [place] [address]   [phone] (home)  DOB: [birth year] MRN: [id number] PRIMARY CARE PROVIDER:    Octavia Heir, NP,  [address] [place] [address] [phone]  REFERRING PROVIDER:   Octavia Heir, NP [phone] [address] [place],  [place] [address] [phone]  RESPONSIBLE PARTY:    Contact Information     Name Relation Home Work [address]   [place], Tre[place] [facility] [phone]  [phone]   Hockenbury,Renee Daughter   [phone]        I met face to face with patient and spouse in their home. Palliative Care was asked to follow this patient by consultation request of  Octavia Heir, NP to address advance care planning and complex medical decision making. This is a follow up visit  ASSESSMENT, SYMPTOM MANAGEMENT AND PLAN / RECOMMENDATIONS:   Underweight Current weight stable Continue Ensure supplements BID to TID  Caregiver burden Needs assistance with daycare for pt as caregiver also has responsibility for farm SW referral to address paid caregiver resources and/or daycare programs      Advance Care Planning/Goals of Care: Goals include to maximize quality of life and symptom management.                         Healthcare agent-spouse Don Poppen  CODE STATUS: MOST as of 02/17/2022: DNR/DNI with comfort measures Antibiotics, if indicated  IV fluids on a case by case, time limited basis No feeding tube.     Follow up Palliative Care Visit: Palliative care will continue to follow for complex medical decision making, advance care planning, and clarification of goals. Return 6 weeks or prn.  This visit was coded based on medical decision making (MDM).  PPS: 60%  HOSPICE ELIGIBILITY/DIAGNOSIS: TBD  Chief Complaint:  Palliative Care is following for chronic  medical management in setting of dementia.  HISTORY OF PRESENT ILLNESS:  [name] is a 76 y.o. year old female with dementia, controlled DM, hx of NSVT/Torsades, HLD and being underweight.    No pain, palpitations, sob, nausea, vomiting, dysuria or constipation. Sleeps well, no falls. Dresses herself after spouse picks out clothes.  Continent of  bowel and bladder. No recent hospitalizations or infections. Drinks about 2 Ensures per day. No trouble with taking medicines, coughing after eating or drinking. Weight has increased by a pound since last visit. He takes her with him to their farm and she will walk around but after a while she winds up getting in the truck and he is concerned it is currently too hot for her.  History obtained from review of EMR, discussion with spouse caregiver and/or Ms. Houpt.    02/19/22: BMP only remarkable for elevated glucose 187.  LFTs were normal including alk phos 66. CBC unremarkable HGB A1c has improved from 10.6% on 08/14/21 to 8.0% 02/19/22  I reviewed available labs, medications, imaging, studies and related documents from the EMR.  Records reviewed and summarized above.   ROS Unable to obtain reliably from pt due to advanced dementia.  Information obtained from pt and corroborated by spouse  Physical Exam: Current and past weights: home scale weight 113 on 03/16/22 02/17/22 112 lbs, weight as of 08/14/2021 was 117 lbs 3.2 ounces. Constitutional: NAD General: thin/underweight CV: S1S2, RRR with murmur loudest at RSB,  no LE edema Pulmonary: CTAB, no increased work of breathing, no cough, room air Abdomen: hypo-active BS + 4 quadrants, soft and non tender MSK: no sarcopenia, moves all extremities, ambulatory Skin: warm and dry, no rashes or wounds on visible skin Neuro:  no generalized weakness, speech and though content more organized at current visit Psych: non-anxious affect, Appears content. A and oriented to name    Thank you for the opportunity to  participate in the care of Ms. Weisenburger.  The palliative care team will continue to follow. Please call our office at [phone] if we can be of additional assistance.   Marijo Conception, FNP-C  COVID-19 PATIENT SCREENING TOOL Asked and negative response unless otherwise noted:   Have you had symptoms of covid, tested positive or been in contact with someone with symptoms/positive test in the past 5-10 days?  No

## 2022-03-19 ENCOUNTER — Telehealth: Payer: Self-pay

## 2022-03-19 NOTE — Telephone Encounter (Signed)
(  5:50 PM) PC SW outreached patients spouse, Dorinda Hill, per [facility] NP - K. Highfill, request to address inquiry of day programs for patient.    Call unsuccessful. SW LVM for spouse with contact information. Awaiting return call.

## 2022-03-26 LAB — HM DIABETES EYE EXAM

## 2022-04-16 DIAGNOSIS — E119 Type 2 diabetes mellitus without complications: Secondary | ICD-10-CM | POA: Diagnosis not present

## 2022-04-27 ENCOUNTER — Encounter: Payer: Self-pay | Admitting: Family Medicine

## 2022-04-27 ENCOUNTER — Other Ambulatory Visit: Payer: Medicare Other | Admitting: Family Medicine

## 2022-04-27 ENCOUNTER — Telehealth: Payer: Self-pay

## 2022-04-27 DIAGNOSIS — Z515 Encounter for palliative care: Secondary | ICD-10-CM

## 2022-04-27 NOTE — Progress Notes (Signed)
[facility] Consult Note Telephone: [phone]  Fax: [phone]    Date of encounter: 04/27/22 12:13 PM PATIENT NAME: Stephanie Acevedo [place] [address]   [phone] (home)  DOB: [birth year] MRN: [id number] PRIMARY CARE PROVIDER:    Yvonna Alanis, NP,  [address] [place] [address] [phone]  REFERRING PROVIDER:   Yvonna Alanis, NP [phone] [address] [place],  [place] [address] [phone]  RESPONSIBLE PARTY:    Contact Information     Name Relation Home Work [address]   Arsh[place], Spell[place] [facility] [place] Daughter   [phone]        I met face to face with patient and spouse in their home. Palliative Care was asked to follow this patient by consultation request of  Yvonna Alanis, NP to address advance care planning and complex medical decision making. This is a follow up visit  ASSESSMENT, SYMPTOM MANAGEMENT AND PLAN / RECOMMENDATIONS:   Underweight Current weight stable Continue Ensure supplements BID to TID  Caregiver burden Educated on normal progression of dementia Praised caregiver for his efforts to make pt happy and validated the difficulty in caregiving. SW referral to address paid caregiver resources and/or daycare programs      Advance Care Planning/Goals of Care: Goals include to maximize quality of life and symptom management.                         Healthcare agent-spouse Don Ing  CODE STATUS: MOST as of 02/17/2022: DNR/DNI with comfort measures Antibiotics, if indicated  IV fluids on a case by case, time limited basis No feeding tube.     Follow up Palliative Care Visit: Palliative care will continue to follow for complex medical decision making, advance care planning, and clarification of goals. Return 6 weeks or prn.  This visit was coded based on medical decision making (MDM).  PPS: 60%  HOSPICE ELIGIBILITY/DIAGNOSIS: TBD  Chief  Complaint:  Palliative Care is following for chronic medical management in setting of dementia. Has had bivalent covid vaccine update in the last 2 weeks.  HISTORY OF PRESENT ILLNESS:  Stephanie Acevedo is a 76 y.o. year old female with dementia, controlled DM, hx of NSVT/Torsades, HLD and being underweight.   Dresses herself after spouse picks out clothes but states she is starting to want to wear his clothes and will often have on different shoes.  Today pt is wearing 2 different shoes with the left shoe on the right foot.  He trimmed her toenails over the weekend and states she has no breakdown on her feet.  He tries to get her laughing at the beginning of each day, will let her sit in his lap in the rocking chair and he sings to her.  He states in the last 2 months there are times she doesn't remember her own name.  She knows who he is to her but has not remembered his name for the last 2-3 years.  Continent of  bowel and bladder. No recent hospitalizations or infections. Drinks about 2 Ensures per day. No trouble with taking medicines or coughing after eating or drinking. Weight has increased by a pound since last visit. He states that they recently went on a trip and he woke up and couldn't find her but she had wandered down to the lobby.  He states when she is ready to walk she just wants to  walk but she doesn't wander.  She is liking wearing his clothes and can't at some times remember her own name.  Otherwise she doesn't wander.  History obtained from review of EMR, discussion with spouse caregiver and/or Ms. Dethlefs.    I reviewed EMR and there are no new available labs, medications, imaging, studies or related documents.  Records reviewed and summarized above.   ROS Unable to obtain reliably from pt due to advanced dementia.  Information obtained from spouse  Physical Exam: Current and past weights: home scale weight 111 today, was 113 on 03/16/22 02/17/22 112 lbs, weight as of 08/14/2021 was 117 lbs 3.2  ounces. Constitutional: NAD General: thin/underweight CV: S1S2, RRR with murmur loudest at RSB, no LE edema Pulmonary: CTAB, no increased work of breathing, no cough, room air Abdomen: active BS + 4 quadrants, soft and non tender MSK: no sarcopenia, moves all extremities, ambulatory Neuro:  no generalized weakness Psych: non-anxious affect, Appears content. A and oriented to name today, noted word salad, only clear expression was "I love you."    Thank you for the opportunity to participate in the care of Ms. Coccia.  The palliative care team will continue to follow. Please call our office at [phone] if we can be of additional assistance.   Marijo Conception, FNP-C  COVID-19 PATIENT SCREENING TOOL Asked and negative response unless otherwise noted:   Have you had symptoms of covid, tested positive or been in contact with someone with symptoms/positive test in the past 5-10 days?  No

## 2022-04-27 NOTE — Telephone Encounter (Signed)
(  11:50 am ) PC SW emailed caregiver resources to patient's husband for in-home caregiver resources.  SW also left a message for patient's husband advising that email was sent with resources.

## 2022-06-02 ENCOUNTER — Telehealth: Payer: Self-pay

## 2022-06-02 NOTE — Telephone Encounter (Signed)
1220- Palliative Check In     Spoke with Elenore Rota- Spouse. Reports patient is doing excellent. Also verified that he received SW email with resources. Denies any issues or concerns at this time.       Reminded spouse of upcoming appt with provider for 06/10/22. He requested to change appt to the next week. States he is [place] pt to the beach to celebrate her birthday. Appt scheduled for 06/17/22 at 1130.

## 2022-06-10 ENCOUNTER — Ambulatory Visit (INDEPENDENT_AMBULATORY_CARE_PROVIDER_SITE_OTHER): Payer: Medicare Other

## 2022-06-10 ENCOUNTER — Other Ambulatory Visit: Payer: Medicare Other | Admitting: Family Medicine

## 2022-06-10 DIAGNOSIS — Z23 Encounter for immunization: Secondary | ICD-10-CM | POA: Diagnosis not present

## 2022-06-17 ENCOUNTER — Other Ambulatory Visit: Payer: Medicare Other | Admitting: Family Medicine

## 2022-06-17 VITALS — BP 118/72 | HR 103 | Resp 16

## 2022-06-17 DIAGNOSIS — Z636 Dependent relative needing care at home: Secondary | ICD-10-CM | POA: Diagnosis not present

## 2022-06-17 DIAGNOSIS — F0390 Unspecified dementia without behavioral disturbance: Secondary | ICD-10-CM | POA: Diagnosis not present

## 2022-06-17 DIAGNOSIS — R636 Underweight: Secondary | ICD-10-CM

## 2022-06-17 NOTE — Progress Notes (Unsigned)
Therapist, nutritional Palliative Care Consult Note Telephone: [phone]  Fax: [phone]    Date of encounter: 06/17/22 12:09 PM PATIENT NAME: Stephanie Acevedo [address] [place] [address]   [phone] (home)  DOB: May 11, 1946 MRN: [id number] PRIMARY CARE PROVIDER:    Octavia Heir, NP,  [address] [place] [address] [phone]  REFERRING PROVIDER:   Octavia Heir, NP [phone] [address] [place],  [place] [address] [phone]  RESPONSIBLE PARTY:    Contact Information     Name Relation Home Work 1 Stephanie Acevedo.   Stephanie, Acevedo [facility] [phone]  [phone]   Acevedo,Stephanie Daughter   [phone]        I met face to face with patient and spouse in their home. Palliative Care was asked to follow this patient by consultation request of  Stephanie Heir, NP to address advance care planning and complex medical decision making. This is a follow up visit  ASSESSMENT, SYMPTOM MANAGEMENT AND PLAN / RECOMMENDATIONS:   Underweight Current weight stable Continue Ensure supplements BID to TID  Caregiver burden Educated on normal progression of dementia Praised caregiver for his efforts to make pt happy and validated the difficulty in caregiving. SW referral to address paid caregiver resources and/or daycare programs      Advance Care Planning/Goals of Care: Goals include to maximize quality of life and symptom management.                         Healthcare agent-spouse Stephanie Acevedo  CODE STATUS: MOST as of 02/17/2022: DNR/DNI with comfort measures Antibiotics, if indicated  IV fluids on a case by case, time limited basis No feeding tube.     Follow up Palliative Care Visit: Palliative care will continue to follow for complex medical decision making, advance care planning, and clarification of goals. Return 6 weeks or prn.  This visit was coded based on medical decision making (MDM).  PPS: 60%  HOSPICE ELIGIBILITY/DIAGNOSIS: TBD  Chief  Complaint:  Palliative Care is following for chronic medical management in setting of dementia. Has had bivalent covid vaccine update in the last 2 weeks.  HISTORY OF PRESENT ILLNESS:  Stephanie Acevedo is a 77 y.o. year old female with dementia, controlled DM, hx of NSVT/Torsades, HLD and being underweight.   Dresses herself after spouse picks out clothes but states she is starting to want to wear his clothes and will often have on different shoes.  Today pt is wearing 2 different shoes with the left shoe on the right foot.  He trimmed her toenails over the weekend and states she has no breakdown on her feet.  He tries to get her laughing at the beginning of each day, will let her sit in his lap in the rocking chair and he sings to her.  He states in the last 2 months there are times she doesn't remember her own name.  She knows who he is to her but has not remembered his name for the last 2-3 years.  Continent of  bowel and bladder. Still doing Ensures 1-2 per day, eating well, no falls.  Incontinent of bowel and bladder. No recent infections.  Overall sleeping well.  Sometimes confused on time of day and wanting to leave at night. No nausea, vomiting or constipation. No changes in meds. He states he did get a list of in home resources. No weight loss.  History obtained from review of EMR, discussion with spouse  caregiver and/or Stephanie Acevedo.    I reviewed EMR and there are no new available labs, medications, imaging, studies or related documents.  Records reviewed and summarized above.   ROS Unable to obtain reliably from pt due to advanced dementia.  Information obtained from spouse  Physical Exam: Current and past weights: home scale weight 111 today, was 113 on 03/16/22 02/17/22 112 lbs, weight as of 08/14/2021 was 117 lbs 3.2 ounces. Constitutional: NAD General: thin/underweight CV: S1S2, RRR with murmur loudest at RSB, no LE edema Pulmonary: CTAB, no increased work of breathing, no cough, room  air Abdomen: active BS + 4 quadrants, soft and non tender MSK: noted sarcopenia in BLE, moves all extremities, ambulatory Neuro:  no generalized weakness. Noted word salad. Psych: non-anxious affect, Appears content. A and oriented to self only    Thank you for the opportunity to participate in the care of Stephanie Acevedo.  The palliative care team will continue to follow. Please call our office at [phone] if we can be of additional assistance.   Stephanie Conception, FNP-C  COVID-19 PATIENT SCREENING TOOL Asked and negative response unless otherwise noted:   Have you had symptoms of covid, tested positive or been in contact with someone with symptoms/positive test in the past 5-10 days?  No

## 2022-06-18 ENCOUNTER — Encounter: Payer: Self-pay | Admitting: Family Medicine

## 2022-08-14 ENCOUNTER — Other Ambulatory Visit: Payer: Self-pay | Admitting: Orthopedic Surgery

## 2022-08-14 DIAGNOSIS — E1169 Type 2 diabetes mellitus with other specified complication: Secondary | ICD-10-CM

## 2022-08-20 ENCOUNTER — Telehealth: Payer: Self-pay | Admitting: *Deleted

## 2022-08-20 NOTE — Telephone Encounter (Signed)
Amy placed a Completed [facility] Form [phone] on my desk and asked that it be faxed to [facility].   Form faxed to [employer] at [employer]: [phone] as requested.   Sent copy to scanning.

## 2022-08-27 ENCOUNTER — Encounter: Payer: Self-pay | Admitting: Orthopedic Surgery

## 2022-08-27 ENCOUNTER — Ambulatory Visit (INDEPENDENT_AMBULATORY_CARE_PROVIDER_SITE_OTHER): Payer: Medicare Other | Admitting: Orthopedic Surgery

## 2022-08-27 VITALS — BP 120/80 | HR 60 | Temp 96.4°F | Resp 18 | Ht 66.0 in | Wt 114.5 lb

## 2022-08-27 DIAGNOSIS — E785 Hyperlipidemia, unspecified: Secondary | ICD-10-CM

## 2022-08-27 DIAGNOSIS — E1169 Type 2 diabetes mellitus with other specified complication: Secondary | ICD-10-CM | POA: Diagnosis not present

## 2022-08-27 DIAGNOSIS — F0390 Unspecified dementia without behavioral disturbance: Secondary | ICD-10-CM

## 2022-08-27 DIAGNOSIS — E1142 Type 2 diabetes mellitus with diabetic polyneuropathy: Secondary | ICD-10-CM | POA: Diagnosis not present

## 2022-08-27 NOTE — Patient Instructions (Addendum)
Fasting labs next visit  Consider RSV,covid, shingles vaccines since she will be around people

## 2022-08-27 NOTE — Progress Notes (Signed)
Careteam: Patient Care Team: Yvonna Alanis, NP as PCP - General (Adult Health Nurse Practitioner)  Seen by: Windell Moulding, AGNP-C  PLACE OF SERVICE:  [place] Directive information    Allergies  Allergen Reactions   Aricept [Donepezil Hcl] Diarrhea    Chief Complaint  Patient presents with   Medical Management of Chronic Issues    Office Visit for 6 month follow up.   Quality Metric Gaps    Needs to Discuss Shingrix ,COVID 90, TDAP, Influenza Vaccines & Annual Yearly Ophthalmology Exam [facility] verified        HPI: Patient is a 76 y.o. female seen today for medical management of chronic conditions.   Husband present during encounter.   Enrolled into [facility]. Starting 2x/week.   No behaviors. Likes to walk. Eating 3 meals daily. No recent falls or injuries.   Discharged from palliative care.   Review of Systems:  Review of Systems  Unable to perform ROS: Dementia    Past Medical History:  Diagnosis Date   Dehydration    Dementia ([place])    Diabetes mellitus without complication ([place])    Hyperlipidemia    Hypokalemia    Hypomagnesemia 08/05/2020   Pt was started on Aricept and developed severe diarrhea, dehydration and hypokalemia that put her in NSVT and Torsades.  Med was stopped and electrolytes repleted with no further arrythmias.   Torsades de pointes (HCC)    Ventricular tachycardia, non-sustained (HCC)    No past surgical history on file. Social History:   reports that she has never smoked. She has never used smokeless tobacco. She reports that she does not drink alcohol and does not use drugs.  Family History  Problem Relation Age of Onset   Dementia Mother 40    Medications: Patient's Medications  New Prescriptions   No medications on file  Previous Medications   MULTIPLE VITAMIN (MULTIVITAMIN WITH MINERALS) TABS TABLET    Take 1 tablet by mouth daily.   ROSUVASTATIN (CRESTOR) 20 MG TABLET    TAKE 1 TABLET BY MOUTH EVERY  DAY  Modified Medications   No medications on file  Discontinued Medications   No medications on file    Physical Exam:  Vitals:   08/27/22 0817  BP: 120/80  Pulse: 60  Resp: 18  Temp: (!) 96.4 F (35.8 C)  SpO2: 96%  Weight: 114 lb 8 oz (51.9 kg)  Height: 5\' 6"  (1.676 m)   Body mass index is 18.48 kg/m. Wt Readings from Last 3 Encounters:  08/27/22 114 lb 8 oz (51.9 kg)  02/19/22 109 lb 9.6 oz (49.7 kg)  08/14/21 117 lb 3.2 oz (53.2 kg)    Physical Exam Vitals reviewed.  Constitutional:      General: She is not in acute distress. HENT:     Head: Normocephalic.  Eyes:     General:        Right eye: No discharge.        Left eye: No discharge.  Cardiovascular:     Rate and Rhythm: Normal rate and regular rhythm.     Pulses:          Dorsalis pedis pulses are 1+ on the right side and 1+ on the left side.     Heart sounds: Normal heart sounds.  Pulmonary:     Effort: Pulmonary effort is normal. No respiratory distress.     Breath sounds: Normal breath sounds. No wheezing.  Abdominal:  General: Bowel sounds are normal. There is no distension.     Palpations: Abdomen is soft.     Tenderness: There is no abdominal tenderness.  Musculoskeletal:     Cervical back: Neck supple.     Right lower leg: No edema.     Left lower leg: No edema.     Right foot: Normal range of motion.     Left foot: Normal range of motion.  Skin:    General: Skin is warm and dry.     Capillary Refill: Capillary refill takes less than 2 seconds.  Neurological:     General: No focal deficit present.     Mental Status: She is alert. Mental status is at baseline.     Motor: No weakness.     Gait: Gait normal.  Psychiatric:        Mood and Affect: Mood normal.     Comments: Aphasia, follows some commands, alert to self/familiar face     Labs reviewed: Basic Metabolic Panel: Recent Labs    02/19/22 0854  NA 141  K 4.6  CL 106  CO2 27  GLUCOSE 187*  BUN 19  CREATININE 0.81   CALCIUM 9.7   Liver Function Tests: Recent Labs    02/19/22 0854  AST 20  ALT 19  BILITOT 0.4  PROT 7.3   No results for input(s): "LIPASE", "AMYLASE" in the last 8760 hours. No results for input(s): "AMMONIA" in the last 8760 hours. CBC: Recent Labs    02/19/22 0854  WBC 7.4  NEUTROABS 5,158  HGB 12.8  HCT 38.7  MCV 89.8  PLT 252   Lipid Panel: Recent Labs    09/29/21 0818 11/27/21 0822  CHOL 255* 161  HDL 83 78  LDLCALC 142* 65  TRIG 167* 96  CHOLHDL 3.1 2.1   TSH: No results for input(s): "TSH" in the last 8760 hours. A1C: Lab Results  Component Value Date   HGBA1C 8.0 (H) 02/19/2022     Assessment/Plan 1. Dementia without behavioral disturbance (HCC) - no behaviors - husband main caregiver - ambulates well, no recent falls - weights stable - recently enrolled in [facility] 2x/week  2. Controlled type 2 diabetes mellitus with diabetic polyneuropathy, without long-term current use of insulin (HCC) - A1c 8.0 02/2022 - diet controlled - A1c- future  Total time: 28 minutes. Greater than 50% of total time spent doing patient education regarding dementia including symptom management.     Next appt: Visit date not found  Sammye Staff Scherry Ran  [facility] [phone]

## 2022-09-09 ENCOUNTER — Other Ambulatory Visit: Payer: Self-pay

## 2022-10-08 DIAGNOSIS — K080 Exfoliation of teeth due to systemic causes: Secondary | ICD-10-CM | POA: Diagnosis not present

## 2022-10-12 DIAGNOSIS — K080 Exfoliation of teeth due to systemic causes: Secondary | ICD-10-CM | POA: Diagnosis not present

## 2022-10-22 DIAGNOSIS — K080 Exfoliation of teeth due to systemic causes: Secondary | ICD-10-CM | POA: Diagnosis not present

## 2022-11-19 DIAGNOSIS — D2262 Melanocytic nevi of left upper limb, including shoulder: Secondary | ICD-10-CM | POA: Diagnosis not present

## 2022-11-19 DIAGNOSIS — C4441 Basal cell carcinoma of skin of scalp and neck: Secondary | ICD-10-CM | POA: Diagnosis not present

## 2022-11-19 DIAGNOSIS — D485 Neoplasm of uncertain behavior of skin: Secondary | ICD-10-CM | POA: Diagnosis not present

## 2022-11-19 DIAGNOSIS — D2261 Melanocytic nevi of right upper limb, including shoulder: Secondary | ICD-10-CM | POA: Diagnosis not present

## 2022-11-19 DIAGNOSIS — D2272 Melanocytic nevi of left lower limb, including hip: Secondary | ICD-10-CM | POA: Diagnosis not present

## 2022-11-19 DIAGNOSIS — D225 Melanocytic nevi of trunk: Secondary | ICD-10-CM | POA: Diagnosis not present

## 2022-12-07 ENCOUNTER — Encounter: Payer: Self-pay | Admitting: Nurse Practitioner

## 2022-12-07 ENCOUNTER — Ambulatory Visit (INDEPENDENT_AMBULATORY_CARE_PROVIDER_SITE_OTHER): Payer: Medicare Other | Admitting: Nurse Practitioner

## 2022-12-07 VITALS — BP 102/70 | HR 77 | Temp 97.0°F | Resp 16 | Ht 65.16 in | Wt 115.6 lb

## 2022-12-07 DIAGNOSIS — E2839 Other primary ovarian failure: Secondary | ICD-10-CM

## 2022-12-07 DIAGNOSIS — Z0000 Encounter for general adult medical examination without abnormal findings: Secondary | ICD-10-CM | POA: Diagnosis not present

## 2022-12-07 NOTE — Progress Notes (Signed)
Subjective:   Stephanie Acevedo is a 77 y.o. female who presents for Medicare Annual (Subsequent) preventive examination.  Review of Systems     Cardiac Risk Factors include: advanced age (>13men, >64 women);diabetes mellitus;dyslipidemia     Objective:    Today's Vitals   12/07/22 1019  BP: 102/70  Pulse: 77  Resp: 16  Temp: (!) 97 F (36.1 C)  TempSrc: Temporal  SpO2: 99%  Weight: 115 lb 9.6 oz (52.4 kg)  Height: 5' 5.16" (1.655 m)   Body mass index is 19.14 kg/m.     12/07/2022   10:14 AM 12/07/2022    9:47 AM 02/19/2022    8:32 AM 12/04/2021    3:17 PM 08/14/2021    9:25 AM 11/28/2020   11:16 AM 11/21/2020    8:00 AM  Advanced Directives  Does Patient Have a Medical Advance Directive? Yes Yes Yes Yes Yes Yes Yes  Type of Paramedic of [place];Living will;Out of facility DNR (pink MOST or yellow form) [place];Living will;Out of facility DNR (pink MOST or yellow form) [place];Out of facility DNR (pink MOST or yellow form) [place];Living will;Out of facility DNR (pink MOST or yellow form) [place];Living will [place];Living will;Out of facility DNR (pink MOST or yellow form) [place];Living will;Out of facility DNR (pink MOST or yellow form)  Does patient want to make changes to medical advance directive? No - Patient declined No - Patient declined No - Patient declined No - Patient declined No - Patient declined No - Patient declined No - Patient declined  Copy of [place] in Chart? Yes - validated most recent copy scanned in chart (See row information) Yes - validated most recent copy scanned in chart (See row information) Yes - validated most recent copy scanned in chart (See row information) Yes - validated most recent copy scanned in chart (See row information) Yes - validated most recent copy scanned in chart (See  row information) Yes - validated most recent copy scanned in chart (See row information) Yes - validated most recent copy scanned in chart (See row information)  Pre-existing out of facility DNR order (yellow form or pink MOST form) Yellow form placed in chart (order not valid for inpatient use);Pink MOST form placed in chart (order not valid for inpatient use) Yellow form placed in chart (order not valid for inpatient use);Pink MOST form placed in chart (order not valid for inpatient use) Yellow form placed in chart (order not valid for inpatient use) Yellow form placed in chart (order not valid for inpatient use)  Yellow form placed in chart (order not valid for inpatient use) Yellow form placed in chart (order not valid for inpatient use)    Current Medications (verified) Outpatient Encounter Medications as of 12/07/2022  Medication Sig   Apoaequorin (PREVAGEN PO) Take by mouth.   Multiple Vitamin (MULTIVITAMIN WITH MINERALS) TABS tablet Take 1 tablet by mouth daily.   rosuvastatin (CRESTOR) 20 MG tablet TAKE 1 TABLET BY MOUTH EVERY DAY   No facility-administered encounter medications on file as of 12/07/2022.    Allergies (verified) Aricept [donepezil hcl]   History: Past Medical History:  Diagnosis Date   Dehydration    Dementia ([place])    Diabetes mellitus without complication ([place])    Hyperlipidemia    Hypokalemia    Hypomagnesemia 08/05/2020   Pt was started on Aricept and developed severe diarrhea, dehydration  and hypokalemia that put her in NSVT and Torsades.  Med was stopped and electrolytes repleted with no further arrythmias.   Torsades de pointes (HCC)    Ventricular tachycardia, non-sustained ([place])    History reviewed. No pertinent surgical history. Family History  Problem Relation Age of Onset   Dementia Mother 26   Social History   Socioeconomic History   Marital status: Married    Spouse name: Elenore Rota    Number of children: 1   Years of education: 16   Highest  education level: Bachelor's degree (e.g., BA, AB, BS)  Occupational History   Not on file  Tobacco Use   Smoking status: Never   Smokeless tobacco: Never  Vaping Use   Vaping Use: Never used  Substance and Sexual Activity   Alcohol use: Never   Drug use: Never   Sexual activity: Not Currently  Other Topics Concern   Not on file  Social History Narrative   Diet:       Do you drink/ eat things with caffeine?  Yes      Marital status:  Married                             What year were you married ?  1970      Do you live in a house, apartment,assistred living, condo, trailer, etc.)?  House      Is it one or more stories?  No      How many persons live in your home ?  2      Do you have any pets in your home ?(please list) None      Highest Level of education completed: College BS      Current or past profession: Heritage manager      Do you exercise?   Yes                           Type & how often  Walking, Daily      ADVANCED DIRECTIVES (Please bring copies)      Do you have a living will? No      Do you have a DNR form?                       If not, do you want to discuss one? Yes      Do you have signed POA?HPOA forms?  Yes               If so, please bring to your appointment      FUNCTIONAL STATUS- To be completed by Spouse / child / Staff       Do you have difficulty bathing or dressing yourself ? No      Do you have difficulty preparing food or eating ?  No      Do you have difficulty managing your mediation ?No      Do you have difficulty managing your finances ? Yes      Do you have difficulty affording your medication ? No      Social Determinants of Radio broadcast assistant Strain: Not on file  Food Insecurity: Not on file  Transportation Needs: Not on file  Physical Activity: Not on file  Stress: Not on file  Social Connections: Not on file    Tobacco Counseling Counseling given: Not Answered  Clinical Intake:                  Diabetic?no         Activities of Daily Living    12/07/2022   10:31 AM 12/07/2022   10:15 AM  In your present state of health, do you have any difficulty performing the following activities:  Hearing? 0   Vision? 0 0  Difficulty concentrating or making decisions? 1 1  Walking or climbing stairs? 1 1  Dressing or bathing? 0 0  Doing errands, shopping? 1 1  Preparing Food and eating ?  Y  Using the Toilet?  N  In the past six months, have you accidently leaked urine?  Y  Do you have problems with loss of bowel control?  Y  Managing your Medications?  Y  Managing your Finances?  Y  Housekeeping or managing your Housekeeping?  Y    Patient Care Team: Yvonna Alanis, NP as PCP - General (Adult Health Nurse Practitioner)  Indicate any recent Medical Services you may have received from other than [facility] providers in the past year (date may be approximate).     Assessment:   This is a routine wellness examination for Stephanie Acevedo.  Hearing/Vision screen No results found.  Dietary issues and exercise activities discussed: Current Exercise Habits: Home exercise routine, Type of exercise: walking, Time (Minutes): 30, Frequency (Times/Week): 7, Weekly Exercise (Minutes/Week): 210, Intensity: Mild, Exercise limited by: neurologic condition(s)   Goals Addressed   None    Depression Screen    12/07/2022   10:15 AM 08/27/2022    8:17 AM 02/19/2022    8:33 AM 12/04/2021    3:14 PM 11/28/2020   11:14 AM 11/21/2020    7:59 AM 08/22/2020    8:32 AM  PHQ 2/9 Scores  PHQ - 2 Score 0 0 0 0 0 0 0    Fall Risk    12/07/2022   10:15 AM 08/27/2022    8:17 AM 02/19/2022    8:33 AM 12/04/2021    3:16 PM 08/14/2021    9:25 AM  Fall Risk   Falls in the past year? 1 0 0 0 0  Number falls in past yr: 0 0 0 0 0  Injury with Fall? 0 0 0 0 0  Risk for fall due to : Impaired balance/gait No Fall Risks No Fall Risks No Fall Risks No Fall Risks  Follow up Falls prevention  discussed;Education provided;Falls evaluation completed Falls evaluation completed Falls evaluation completed Falls evaluation completed Falls evaluation completed    FALL RISK PREVENTION PERTAINING TO THE HOME:  Any stairs in or around the home? No  If so, are there any without handrails?  na Home free of loose throw rugs in walkways, pet beds, electrical cords, etc? Yes  Adequate lighting in your home to reduce risk of falls? Yes   ASSISTIVE DEVICES UTILIZED TO PREVENT FALLS:  Life alert? No  Use of a cane, walker or w/c? No  Grab bars in the bathroom? Yes  Shower chair or bench in shower? Yes  Elevated toilet seat or a handicapped toilet? No   TIMED UP AND GO:  Was the test performed? No .    Cognitive Function:    11/27/2019    1:58 PM  MMSE - Mini Mental State Exam  Orientation to time 1  Orientation to Place 2.5  Registration 1  Attention/ Calculation 2.5  Recall 1  Language- name 2 objects 2  Language- repeat  0  Language- follow 3 step command 1  Language- read & follow direction 0  Write a sentence 0  Copy design 0  Total score 11        12/07/2022   10:17 AM 11/28/2020   11:17 AM  6CIT Screen  What Year? 4 points 4 points  What month? 3 points 3 points  What time? 3 points 0 points  Count back from 20 4 points 4 points  Months in reverse 4 points 4 points  Repeat phrase 10 points 10 points  Total Score 28 points 25 points    Immunizations Immunization History  Administered Date(s) Administered   Fluad Quad(high Dose 65+) 06/10/2022   PFIZER Comirnaty(Gray Top)Covid-19 Tri-Sucrose Vaccine 12/26/2020   PFIZER(Purple Top)SARS-COV-2 Vaccination 01/18/2020, 02/13/2020, 08/31/2020   Pfizer Covid-19 Vaccine Bivalent Booster 37yrs & up 07/01/2021   Pneumococcal Conjugate-13 04/04/2020    TDAP status: Due, Education has been provided regarding the importance of this vaccine. Advised may receive this vaccine at local pharmacy or Health Dept. Aware to  provide a copy of the vaccination record if obtained from local pharmacy or Health Dept. Verbalized acceptance and understanding.  Flu Vaccine status: Declined, Education has been provided regarding the importance of this vaccine but patient still declined. Advised may receive this vaccine at local pharmacy or Health Dept. Aware to provide a copy of the vaccination record if obtained from local pharmacy or Health Dept. Verbalized acceptance and understanding.  Pneumococcal vaccine status: Declined,  Education has been provided regarding the importance of this vaccine but patient still declined. Advised may receive this vaccine at local pharmacy or Health Dept. Aware to provide a copy of the vaccination record if obtained from local pharmacy or Health Dept. Verbalized acceptance and understanding.   Covid-19 vaccine status: Information provided on how to obtain vaccines.   Qualifies for Shingles Vaccine? Yes   Zostavax completed No   Shingrix Completed?: No.    Education has been provided regarding the importance of this vaccine. Patient has been advised to call insurance company to determine out of pocket expense if they have not yet received this vaccine. Advised may also receive vaccine at local pharmacy or Health Dept. Verbalized acceptance and understanding.  Screening Tests Health Maintenance  Topic Date Due   DTaP/Tdap/Td (1 - Tdap) Never done   FOOT EXAM  08/14/2022   HEMOGLOBIN A1C  08/21/2022   COVID-19 Vaccine (6 - 2023-24 season) 12/23/2022 (Originally 05/15/2022)   Zoster Vaccines- Shingrix (1 of 2) 03/09/2023 (Originally 06/04/1997)   Pneumonia Vaccine 6+ Years old (2 of 2 - PPSV23 or PCV20) 08/28/2023 (Originally 04/04/2021)   OPHTHALMOLOGY EXAM  03/27/2023   Hepatitis C Screening  Completed   HPV VACCINES  Aged Out   INFLUENZA VACCINE  Discontinued   DEXA SCAN  Discontinued   Fecal DNA (Cologuard)  Discontinued    Health Maintenance  Health Maintenance Due  Topic Date Due    DTaP/Tdap/Td (1 - Tdap) Never done   FOOT EXAM  08/14/2022   HEMOGLOBIN A1C  08/21/2022    Colorectal cancer screening: No longer required.   Mammogram status: No longer required due to age.  Bone Density status: Ordered today. Pt provided with contact info and advised to call to schedule appt.  Lung Cancer Screening: (Low Dose CT Chest recommended if Age 76-80 years, 30 pack-year currently smoking OR have quit w/in 15years.) does not qualify.   Lung Cancer Screening Referral: na  Additional Screening:  Hepatitis C Screening: does qualify; Completed  2021  Vision Screening: Recommended annual ophthalmology exams for early detection of glaucoma and other disorders of the eye. Is the patient up to date with their annual eye exam?  Yes  Who is the provider or what is the name of the office in which the patient attends annual eye exams? [place] [facility] If pt is not established with a provider, would they like to be referred to a provider to establish care? No .   Dental Screening: Recommended annual dental exams for proper oral hygiene  Community Resource Referral / Chronic Care Management: CRR required this visit?  No   CCM required this visit?  Yes      Plan:     I have personally reviewed and noted the following in the patient's chart:   Medical and social history Use of alcohol, tobacco or illicit drugs  Current medications and supplements including opioid prescriptions. Patient is not currently taking opioid prescriptions. Functional ability and status Nutritional status Physical activity Advanced directives List of other physicians Hospitalizations, surgeries, and ER visits in previous 12 months Vitals Screenings to include cognitive, depression, and falls Referrals and appointments  In addition, I have reviewed and discussed with patient certain preventive protocols, quality metrics, and best practice recommendations. A written personalized care plan for  preventive services as well as general preventive health recommendations were provided to patient.     Lauree Chandler, NP   12/07/2022   Place of service: [facility]

## 2022-12-07 NOTE — Patient Instructions (Signed)
Stephanie Acevedo , Thank you for taking time to come for your Medicare Wellness Visit. I appreciate your ongoing commitment to your health goals. Please review the following plan we discussed and let me know if I can assist you in the future.   Screening recommendations/referrals: Colonoscopy aged out Mammogram aged out Bone Density -recommended to complete-[phone] Recommended yearly ophthalmology/optometry visit for glaucoma screening and checkup Recommended yearly dental visit for hygiene and checkup  Vaccinations: Influenza vaccine- due annually in September/October Pneumococcal vaccine - DUE- recommend to get at your local pharmacy or in office  Tdap vaccine -DUE- recommend to get at your local pharmacy Shingles vaccine -DUE- recommend to get at your local pharmacy    Advanced directives: on file.   Conditions/risks identified: advance age, dementia, fall risk  Next appointment: yearly for awv   Preventive Care 76 Years and Older, Female Preventive care refers to lifestyle choices and visits with your health care provider that can promote health and wellness. What does preventive care include? A yearly physical exam. This is also called an annual well check. Dental exams once or twice a year. Routine eye exams. Ask your health care provider how often you should have your eyes checked. Personal lifestyle choices, including: Daily care of your teeth and gums. Regular physical activity. Eating a healthy diet. Avoiding tobacco and drug use. Limiting alcohol use. Practicing safe sex. Taking low-dose aspirin every day. Taking vitamin and mineral supplements as recommended by your health care provider. What happens during an annual well check? The services and screenings done by your health care provider during your annual well check will depend on your age, overall health, lifestyle risk factors, and family history of disease. Counseling  Your health care provider may ask you  questions about your: Alcohol use. Tobacco use. Drug use. Emotional well-being. Home and relationship well-being. Sexual activity. Eating habits. History of falls. Memory and ability to understand (cognition). Work and work Statistician. Reproductive health. Screening  You may have the following tests or measurements: Height, weight, and BMI. Blood pressure. Lipid and cholesterol levels. These may be checked every 5 years, or more frequently if you are over 76 years old. Skin check. Lung cancer screening. You may have this screening every year starting at age 76 if you have a 30-pack-year history of smoking and currently smoke or have quit within the past 15 years. Fecal occult blood test (FOBT) of the stool. You may have this test every year starting at age 76. Flexible sigmoidoscopy or colonoscopy. You may have a sigmoidoscopy every 5 years or a colonoscopy every 10 years starting at age 76. Hepatitis C blood test. Hepatitis B blood test. Sexually transmitted disease (STD) testing. Diabetes screening. This is done by checking your blood sugar (glucose) after you have not eaten for a while (fasting). You may have this done every 1-3 years. Bone density scan. This is done to screen for osteoporosis. You may have this done starting at age 76. Mammogram. This may be done every 1-2 years. Talk to your health care provider about how often you should have regular mammograms. Talk with your health care provider about your test results, treatment options, and if necessary, the need for more tests. Vaccines  Your health care provider may recommend certain vaccines, such as: Influenza vaccine. This is recommended every year. Tetanus, diphtheria, and acellular pertussis (Tdap, Td) vaccine. You may need a Td booster every 10 years. Zoster vaccine. You may need this after age 64. Pneumococcal 13-valent conjugate (PCV13) vaccine.  One dose is recommended after age 74. Pneumococcal polysaccharide  (PPSV23) vaccine. One dose is recommended after age 76. Talk to your health care provider about which screenings and vaccines you need and how often you need them. This information is not intended to replace advice given to you by your health care provider. Make sure you discuss any questions you have with your health care provider. Document Released: 09/27/2015 Document Revised: 05/20/2016 Document Reviewed: 07/02/2015 Elsevier Interactive Patient Education  2017 [employer] Prevention in the Home Falls can cause injuries. They can happen to people of all ages. There are many things you can do to make your home safe and to help prevent falls. What can I do on the outside of my home? Regularly fix the edges of walkways and driveways and fix any cracks. Remove anything that might make you trip as you walk through a door, such as a raised step or threshold. Trim any bushes or trees on the path to your home. Use bright outdoor lighting. Clear any walking paths of anything that might make someone trip, such as rocks or tools. Regularly check to see if handrails are loose or broken. Make sure that both sides of any steps have handrails. Any raised decks and porches should have guardrails on the edges. Have any leaves, snow, or ice cleared regularly. Use sand or salt on walking paths during winter. Clean up any spills in your garage right away. This includes oil or grease spills. What can I do in the bathroom? Use night lights. Install grab bars by the toilet and in the tub and shower. Do not use towel bars as grab bars. Use non-skid mats or decals in the tub or shower. If you need to sit down in the shower, use a plastic, non-slip stool. Keep the floor dry. Clean up any water that spills on the floor as soon as it happens. Remove soap buildup in the tub or shower regularly. Attach bath mats securely with double-sided non-slip rug tape. Do not have throw rugs and other things on the  floor that can make you trip. What can I do in the bedroom? Use night lights. Make sure that you have a light by your bed that is easy to reach. Do not use any sheets or blankets that are too big for your bed. They should not hang down onto the floor. Have a firm chair that has side arms. You can use this for support while you get dressed. Do not have throw rugs and other things on the floor that can make you trip. What can I do in the kitchen? Clean up any spills right away. Avoid walking on wet floors. Keep items that you use a lot in easy-to-reach places. If you need to reach something above you, use a strong step stool that has a grab bar. Keep electrical cords out of the way. Do not use floor polish or wax that makes floors slippery. If you must use wax, use non-skid floor wax. Do not have throw rugs and other things on the floor that can make you trip. What can I do with my stairs? Do not leave any items on the stairs. Make sure that there are handrails on both sides of the stairs and use them. Fix handrails that are broken or loose. Make sure that handrails are as long as the stairways. Check any carpeting to make sure that it is firmly attached to the stairs. Fix any carpet that is loose or  worn. Avoid having throw rugs at the top or bottom of the stairs. If you do have throw rugs, attach them to the floor with carpet tape. Make sure that you have a light switch at the top of the stairs and the bottom of the stairs. If you do not have them, ask someone to add them for you. What else can I do to help prevent falls? Wear shoes that: Do not have high heels. Have rubber bottoms. Are comfortable and fit you well. Are closed at the toe. Do not wear sandals. If you use a stepladder: Make sure that it is fully opened. Do not climb a closed stepladder. Make sure that both sides of the stepladder are locked into place. Ask someone to hold it for you, if possible. Clearly mark and make  sure that you can see: Any grab bars or handrails. First and last steps. Where the edge of each step is. Use tools that help you move around (mobility aids) if they are needed. These include: Canes. Walkers. Scooters. Crutches. Turn on the lights when you go into a dark area. Replace any light bulbs as soon as they burn out. Set up your furniture so you have a clear path. Avoid moving your furniture around. If any of your floors are uneven, fix them. If there are any pets around you, be aware of where they are. Review your medicines with your doctor. Some medicines can make you feel dizzy. This can increase your chance of falling. Ask your doctor what other things that you can do to help prevent falls. This information is not intended to replace advice given to you by your health care provider. Make sure you discuss any questions you have with your health care provider. Document Released: 06/27/2009 Document Revised: 02/06/2016 Document Reviewed: 10/05/2014 Elsevier Interactive Patient Education  2017 [employer].

## 2022-12-08 ENCOUNTER — Encounter: Payer: Medicare Other | Admitting: Nurse Practitioner

## 2022-12-17 DIAGNOSIS — L988 Other specified disorders of the skin and subcutaneous tissue: Secondary | ICD-10-CM | POA: Diagnosis not present

## 2022-12-17 DIAGNOSIS — L814 Other melanin hyperpigmentation: Secondary | ICD-10-CM | POA: Diagnosis not present

## 2022-12-17 DIAGNOSIS — L578 Other skin changes due to chronic exposure to nonionizing radiation: Secondary | ICD-10-CM | POA: Diagnosis not present

## 2022-12-17 DIAGNOSIS — C4441 Basal cell carcinoma of skin of scalp and neck: Secondary | ICD-10-CM | POA: Diagnosis not present

## 2023-02-05 ENCOUNTER — Other Ambulatory Visit: Payer: Self-pay | Admitting: Orthopedic Surgery

## 2023-02-05 DIAGNOSIS — E1169 Type 2 diabetes mellitus with other specified complication: Secondary | ICD-10-CM

## 2023-02-25 ENCOUNTER — Ambulatory Visit (INDEPENDENT_AMBULATORY_CARE_PROVIDER_SITE_OTHER): Payer: Medicare Other | Admitting: Orthopedic Surgery

## 2023-02-25 ENCOUNTER — Encounter: Payer: Self-pay | Admitting: Orthopedic Surgery

## 2023-02-25 VITALS — BP 120/86 | HR 83 | Temp 96.8°F | Resp 16 | Ht 65.16 in | Wt 111.0 lb

## 2023-02-25 DIAGNOSIS — E1142 Type 2 diabetes mellitus with diabetic polyneuropathy: Secondary | ICD-10-CM | POA: Diagnosis not present

## 2023-02-25 DIAGNOSIS — E1169 Type 2 diabetes mellitus with other specified complication: Secondary | ICD-10-CM

## 2023-02-25 DIAGNOSIS — E785 Hyperlipidemia, unspecified: Secondary | ICD-10-CM

## 2023-02-25 DIAGNOSIS — F0390 Unspecified dementia without behavioral disturbance: Secondary | ICD-10-CM

## 2023-02-25 DIAGNOSIS — Z23 Encounter for immunization: Secondary | ICD-10-CM | POA: Diagnosis not present

## 2023-02-25 NOTE — Patient Instructions (Addendum)
Please get flu vaccine and covid booster in fall> best to get BEFORE Thanksgiving  If any behaviors, consider Benadryl to help calm her down  Please let me know if se starts to have any behaviors  Prevnar 20> pneumonia vaccine given today

## 2023-02-25 NOTE — Progress Notes (Signed)
Careteam: Patient Care Team: Octavia Heir, NP as PCP - General (Adult Health Nurse Practitioner)  Seen by: Hazle Nordmann, AGNP-C  PLACE OF SERVICE:  [facility]  Advanced Directive information Does Patient Have a Medical Advance Directive?: Yes, Type of Advance Directive: Healthcare Power of [place];Living will;Out of facility DNR (pink MOST or yellow form), Does patient want to make changes to medical advance directive?: No - Patient declined  Allergies  Allergen Reactions   Aricept [Donepezil Hcl] Diarrhea    Chief Complaint  Patient presents with   Medical Management of Chronic Issues    6 month follow up.   Health Maintenance    Discuss the need for Hepatitis C Screening, Foot exam, Dexa scan, and Hemoglobin A1C.   Immunizations    Discuss the need for Pne vaccine, and Covid Booster.     HPI: Patient is a 76 y.o. female seen today for medical management of chronic conditions.   Husband/caregiver present during encounter.   She is going to [employer] 4x/week.   Nonverbal today.   Walking about 1/2 mile daily. No recent falls or injuries. Sometimes grab furniture to balance.   Some sundowning about 2x/weekly. She will fold clothes when this occurs. No agitation.   Weights down 3 lbs.   Not on medication to T2DM. No hypoglycemias.   Goals of care discussed, he would like to limit prescribed medications.   Prevnar 20 vaccine given today.   Review of Systems:  Review of Systems  Unable to perform ROS: Dementia    Past Medical History:  Diagnosis Date   Dehydration    Dementia (HCC)    Diabetes mellitus without complication (HCC)    Hyperlipidemia    Hypokalemia    Hypomagnesemia 08/05/2020   Pt was started on Aricept and developed severe diarrhea, dehydration and hypokalemia that put her in NSVT and Torsades.  Med was stopped and electrolytes repleted with no further arrythmias.   Torsades de pointes (HCC)    Ventricular tachycardia,  non-sustained (HCC)    History reviewed. No pertinent surgical history. Social History:   reports that she has never smoked. She has never used smokeless tobacco. She reports that she does not drink alcohol and does not use drugs.  Family History  Problem Relation Age of Onset   Dementia Mother 59    Medications: Patient's Medications  New Prescriptions   No medications on file  Previous Medications   APOAEQUORIN (PREVAGEN PO)    Take by mouth.   MULTIPLE VITAMIN (MULTIVITAMIN WITH MINERALS) TABS TABLET    Take 1 tablet by mouth daily.   ROSUVASTATIN (CRESTOR) 20 MG TABLET    TAKE 1 TABLET BY MOUTH EVERY DAY  Modified Medications   No medications on file  Discontinued Medications   No medications on file    Physical Exam:  Vitals:   02/25/23 0823  BP: 120/86  Pulse: 83  Resp: 16  Temp: (!) 96.8 F (36 C)  SpO2: 94%  Weight: 111 lb (50.3 kg)  Height: 5' 5.16" (1.655 m)   Body mass index is 18.38 kg/m. Wt Readings from Last 3 Encounters:  02/25/23 111 lb (50.3 kg)  12/07/22 115 lb 9.6 oz (52.4 kg)  08/27/22 114 lb 8 oz (51.9 kg)    Physical Exam Vitals reviewed.  Constitutional:      General: She is not in acute distress. HENT:     Head: Normocephalic.     Right Ear: There is no impacted cerumen.  Left Ear: There is no impacted cerumen.     Nose: Nose normal.     Mouth/Throat:     Mouth: Mucous membranes are moist.  Eyes:     General:        Right eye: No discharge.        Left eye: No discharge.  Cardiovascular:     Rate and Rhythm: Normal rate and regular rhythm.     Pulses: Normal pulses.     Heart sounds: Normal heart sounds.  Pulmonary:     Effort: Pulmonary effort is normal. No respiratory distress.     Breath sounds: Normal breath sounds. No wheezing.  Abdominal:     General: Bowel sounds are normal. There is no distension.     Palpations: Abdomen is soft.     Tenderness: There is no abdominal tenderness.  Musculoskeletal:     Cervical  back: Neck supple.     Right lower leg: No edema.     Left lower leg: No edema.  Skin:    General: Skin is warm.     Capillary Refill: Capillary refill takes less than 2 seconds.  Neurological:     General: No focal deficit present.     Mental Status: She is alert. Mental status is at baseline.     Gait: Gait abnormal.  Psychiatric:        Mood and Affect: Mood normal.     Comments: Nonverbal, does not follow commands, alert to self     Labs reviewed: Basic Metabolic Panel: No results for input(s): "NA", "K", "CL", "CO2", "GLUCOSE", "BUN", "CREATININE", "CALCIUM", "MG", "PHOS", "TSH" in the last 8760 hours. Liver Function Tests: No results for input(s): "AST", "ALT", "ALKPHOS", "BILITOT", "PROT", "ALBUMIN" in the last 8760 hours. No results for input(s): "LIPASE", "AMYLASE" in the last 8760 hours. No results for input(s): "AMMONIA" in the last 8760 hours. CBC: No results for input(s): "WBC", "NEUTROABS", "HGB", "HCT", "MCV", "PLT" in the last 8760 hours. Lipid Panel: No results for input(s): "CHOL", "HDL", "LDLCALC", "TRIG", "CHOLHDL", "LDLDIRECT" in the last 8760 hours. TSH: No results for input(s): "TSH" in the last 8760 hours. A1C: Lab Results  Component Value Date   HGBA1C 8.0 (H) 02/19/2022     Assessment/Plan 1. Controlled type 2 diabetes mellitus with diabetic polyneuropathy, without long-term current use of insulin (HCC) - A1c 8.0> was 10.6 - CMP with eGFR([facility]) - Hemoglobin A1c  2. Dementia without behavioral disturbance (HCC) - no behaviors - ambulating> no recent falls - weights down 3 lbs - nonverbal - can complete tasks - not on medication - CBC with Differential/Platelet  3. Hyperlipidemia associated with type 2 diabetes mellitus (HCC) - LDL 65 11/27/2021 - cont rosuvastatin - Lipid Panel  Total time: 32 minutes. Greater than 50% of total time spent doing patient education regarding health maintenance, dementia, falls safety, T2DM, HLD.      Next appt: Visit date not found  Messiyah Waterson Scherry Ran  [facility] [phone]

## 2023-02-26 ENCOUNTER — Other Ambulatory Visit: Payer: Self-pay | Admitting: Orthopedic Surgery

## 2023-02-26 DIAGNOSIS — E1142 Type 2 diabetes mellitus with diabetic polyneuropathy: Secondary | ICD-10-CM

## 2023-02-26 LAB — LIPID PANEL
Cholesterol: 169 mg/dL (ref <200)
HDL: 89 mg/dL (ref >=50)
LDL Cholesterol (Calc): 64 mg/dL (calc)
Non-HDL Cholesterol (Calc): 80 mg/dL (calc) (ref <130)
Total CHOL/HDL Ratio: 1.9 (calc) (ref <5.0)
Triglycerides: 78 mg/dL (ref <150)

## 2023-02-26 LAB — HEMOGLOBIN A1C
Hgb A1c MFr Bld: 8.7 % of total Hgb — ABNORMAL HIGH (ref <5.7)
Mean Plasma Glucose: 203 mg/dL
eAG (mmol/L): 11.2 mmol/L

## 2023-02-26 LAB — COMPLETE METABOLIC PANEL WITH GFR
AG Ratio: 1.4 (calc) (ref 1.0–2.5)
ALT: 13 U/L (ref 6–29)
AST: 14 U/L (ref 10–35)
Albumin: 4.2 g/dL (ref 3.6–5.1)
Alkaline phosphatase (APISO): 61 U/L (ref 37–153)
BUN: 20 mg/dL (ref 7–25)
CO2: 29 mmol/L (ref 20–32)
Calcium: 9.7 mg/dL (ref 8.6–10.4)
Chloride: 105 mmol/L (ref 98–110)
Creat: 0.74 mg/dL (ref 0.60–1.00)
Globulin: 3.1 g/dL (calc) (ref 1.9–3.7)
Glucose, Bld: 206 mg/dL — ABNORMAL HIGH (ref 65–99)
Potassium: 4.5 mmol/L (ref 3.5–5.3)
Sodium: 139 mmol/L (ref 135–146)
Total Bilirubin: 0.4 mg/dL (ref 0.2–1.2)
Total Protein: 7.3 g/dL (ref 6.1–8.1)
eGFR: 84 mL/min/{1.73_m2} (ref >=60)

## 2023-02-26 LAB — CBC WITH DIFFERENTIAL/PLATELET
Absolute Monocytes: 418 cells/uL (ref 200–950)
Basophils Absolute: 61 cells/uL (ref 0–200)
Basophils Relative: 0.8 %
Eosinophils Absolute: 53 cells/uL (ref 15–500)
Eosinophils Relative: 0.7 %
HCT: 39.1 % (ref 35.0–45.0)
Hemoglobin: 12.8 g/dL (ref 11.7–15.5)
Lymphs Abs: 1535 cells/uL (ref 850–3900)
MCH: 29.5 pg (ref 27.0–33.0)
MCHC: 32.7 g/dL (ref 32.0–36.0)
MCV: 90.1 fL (ref 80.0–100.0)
MPV: 9.7 fL (ref 7.5–12.5)
Monocytes Relative: 5.5 %
Neutro Abs: 5533 cells/uL (ref 1500–7800)
Neutrophils Relative %: 72.8 %
Platelets: 273 10*3/uL (ref 140–400)
RBC: 4.34 10*6/uL (ref 3.80–5.10)
RDW: 12.3 % (ref 11.0–15.0)
Total Lymphocyte: 20.2 %
WBC: 7.6 10*3/uL (ref 3.8–10.8)

## 2023-02-26 MED ORDER — METFORMIN HCL ER 500 MG PO TB24: 500.0000 mg | ORAL_TABLET | Freq: Every day | ORAL | 1 refills | Status: DC

## 2023-03-28 ENCOUNTER — Inpatient Hospital Stay (HOSPITAL_COMMUNITY): Payer: Medicare Other

## 2023-03-28 ENCOUNTER — Encounter (HOSPITAL_COMMUNITY): Payer: Self-pay

## 2023-03-28 ENCOUNTER — Observation Stay (HOSPITAL_COMMUNITY)
Admission: EM | Admit: 2023-03-28 | Discharge: 2023-03-29 | Disposition: A | Discharge status: Home / Self Care | Payer: Medicare Other | Attending: Internal Medicine | Admitting: Internal Medicine

## 2023-03-28 ENCOUNTER — Emergency Department (HOSPITAL_COMMUNITY): Payer: Medicare Other

## 2023-03-28 DIAGNOSIS — M6281 Muscle weakness (generalized): Secondary | ICD-10-CM | POA: Diagnosis not present

## 2023-03-28 DIAGNOSIS — E860 Dehydration: Secondary | ICD-10-CM | POA: Diagnosis not present

## 2023-03-28 DIAGNOSIS — Z7984 Long term (current) use of oral hypoglycemic drugs: Secondary | ICD-10-CM | POA: Insufficient documentation

## 2023-03-28 DIAGNOSIS — R112 Nausea with vomiting, unspecified: Principal | ICD-10-CM | POA: Diagnosis present

## 2023-03-28 DIAGNOSIS — F0390 Unspecified dementia without behavioral disturbance: Secondary | ICD-10-CM | POA: Insufficient documentation

## 2023-03-28 DIAGNOSIS — R1114 Bilious vomiting: Secondary | ICD-10-CM

## 2023-03-28 DIAGNOSIS — E119 Type 2 diabetes mellitus without complications: Secondary | ICD-10-CM | POA: Insufficient documentation

## 2023-03-28 DIAGNOSIS — R109 Unspecified abdominal pain: Secondary | ICD-10-CM | POA: Diagnosis not present

## 2023-03-28 DIAGNOSIS — D72829 Elevated white blood cell count, unspecified: Secondary | ICD-10-CM | POA: Diagnosis not present

## 2023-03-28 DIAGNOSIS — M40204 Unspecified kyphosis, thoracic region: Secondary | ICD-10-CM | POA: Diagnosis not present

## 2023-03-28 DIAGNOSIS — Z79899 Other long term (current) drug therapy: Secondary | ICD-10-CM | POA: Diagnosis not present

## 2023-03-28 DIAGNOSIS — K5730 Diverticulosis of large intestine without perforation or abscess without bleeding: Secondary | ICD-10-CM | POA: Diagnosis not present

## 2023-03-28 DIAGNOSIS — I517 Cardiomegaly: Secondary | ICD-10-CM | POA: Diagnosis not present

## 2023-03-28 DIAGNOSIS — R1110 Vomiting, unspecified: Secondary | ICD-10-CM | POA: Diagnosis present

## 2023-03-28 LAB — CBC WITH DIFFERENTIAL/PLATELET
Abs Immature Granulocytes: 0.12 10*3/uL — ABNORMAL HIGH (ref 0.00–0.07)
Basophils Absolute: 0.1 10*3/uL (ref 0.0–0.1)
Basophils Relative: 0 %
Eosinophils Absolute: 0 10*3/uL (ref 0.0–0.5)
Eosinophils Relative: 0 %
HCT: 35.4 % — ABNORMAL LOW (ref 36.0–46.0)
Hemoglobin: 11.6 g/dL — ABNORMAL LOW (ref 12.0–15.0)
Immature Granulocytes: 1 %
Lymphocytes Relative: 5 %
Lymphs Abs: 0.9 10*3/uL (ref 0.7–4.0)
MCH: 30.4 pg (ref 26.0–34.0)
MCHC: 32.8 g/dL (ref 30.0–36.0)
MCV: 92.9 fL (ref 80.0–100.0)
Monocytes Absolute: 0.5 10*3/uL (ref 0.1–1.0)
Monocytes Relative: 3 %
Neutro Abs: 17 10*3/uL — ABNORMAL HIGH (ref 1.7–7.7)
Neutrophils Relative %: 91 %
Platelets: 232 10*3/uL (ref 150–400)
RBC: 3.81 MIL/uL — ABNORMAL LOW (ref 3.87–5.11)
RDW: 12.5 % (ref 11.5–15.5)
WBC: 18.6 10*3/uL — ABNORMAL HIGH (ref 4.0–10.5)
nRBC: 0 % (ref 0.0–0.2)

## 2023-03-28 LAB — COMPREHENSIVE METABOLIC PANEL
ALT: 26 U/L (ref 0–44)
AST: 23 U/L (ref 15–41)
Albumin: 3.8 g/dL (ref 3.5–5.0)
Alkaline Phosphatase: 59 U/L (ref 38–126)
Anion gap: 12 (ref 5–15)
BUN: 29 mg/dL — ABNORMAL HIGH (ref 8–23)
CO2: 22 mmol/L (ref 22–32)
Calcium: 9.3 mg/dL (ref 8.9–10.3)
Chloride: 102 mmol/L (ref 98–111)
Creatinine, Ser: 0.81 mg/dL (ref 0.44–1.00)
GFR, Estimated: >60 mL/min (ref >60)
Glucose, Bld: 319 mg/dL — ABNORMAL HIGH (ref 70–99)
Potassium: 4.3 mmol/L (ref 3.5–5.1)
Sodium: 136 mmol/L (ref 135–145)
Total Bilirubin: 0.4 mg/dL (ref 0.3–1.2)
Total Protein: 7.1 g/dL (ref 6.5–8.1)

## 2023-03-28 LAB — CREATININE, SERUM
Creatinine, Ser: 0.79 mg/dL (ref 0.44–1.00)
GFR, Estimated: >60 mL/min (ref >60)

## 2023-03-28 LAB — TROPONIN I (HIGH SENSITIVITY)
Troponin I (High Sensitivity): 3 ng/L (ref <18)
Troponin I (High Sensitivity): 3 ng/L (ref <18)

## 2023-03-28 LAB — URINALYSIS, ROUTINE W REFLEX MICROSCOPIC
Bilirubin Urine: NEGATIVE
Glucose, UA: >=500 mg/dL — AB
Hgb urine dipstick: NEGATIVE
Ketones, ur: 5 mg/dL — AB
Nitrite: NEGATIVE
Protein, ur: NEGATIVE mg/dL
Specific Gravity, Urine: 1.032 — ABNORMAL HIGH (ref 1.005–1.030)
pH: 6 (ref 5.0–8.0)

## 2023-03-28 LAB — I-STAT CHEM 8, ED
BUN: 31 mg/dL — ABNORMAL HIGH (ref 8–23)
Calcium, Ion: 1.16 mmol/L (ref 1.15–1.40)
Chloride: 105 mmol/L (ref 98–111)
Creatinine, Ser: 0.7 mg/dL (ref 0.44–1.00)
Glucose, Bld: 316 mg/dL — ABNORMAL HIGH (ref 70–99)
HCT: 34 % — ABNORMAL LOW (ref 36.0–46.0)
Hemoglobin: 11.6 g/dL — ABNORMAL LOW (ref 12.0–15.0)
Potassium: 4.5 mmol/L (ref 3.5–5.1)
Sodium: 137 mmol/L (ref 135–145)
TCO2: 23 mmol/L (ref 22–32)

## 2023-03-28 MED ORDER — LACTATED RINGERS IV SOLN: INTRAVENOUS | Status: DC

## 2023-03-28 MED ORDER — SODIUM CHLORIDE 0.9 % IV SOLN: 1.0000 g | INTRAVENOUS | Status: DC

## 2023-03-28 MED ORDER — ONDANSETRON HCL 4 MG/2ML IJ SOLN
4.0000 mg | Freq: Once | INTRAMUSCULAR | Status: AC
  Administered 2023-03-28: 4 mg via INTRAVENOUS
  Filled 2023-03-28: qty 2

## 2023-03-28 MED ORDER — ACETAMINOPHEN 650 MG RE SUPP: 650.0000 mg | Freq: Four times a day (QID) | RECTAL | Status: DC | PRN

## 2023-03-28 MED ORDER — ENOXAPARIN SODIUM 30 MG/0.3ML IJ SOSY
30.0000 mg | PREFILLED_SYRINGE | INTRAMUSCULAR | Status: DC
  Administered 2023-03-29: 30 mg via SUBCUTANEOUS
  Filled 2023-03-28: qty 0.3

## 2023-03-28 MED ORDER — LACTATED RINGERS IV BOLUS
1000.0000 mL | Freq: Once | INTRAVENOUS | Status: AC
  Administered 2023-03-28: 1000 mL via INTRAVENOUS

## 2023-03-28 MED ORDER — ACETAMINOPHEN 325 MG PO TABS: 650.0000 mg | ORAL_TABLET | Freq: Four times a day (QID) | ORAL | Status: DC | PRN

## 2023-03-28 MED ORDER — INSULIN ASPART 100 UNIT/ML IJ SOLN
0.0000 [IU] | Freq: Three times a day (TID) | INTRAMUSCULAR | Status: DC
  Administered 2023-03-29: 3 [IU] via SUBCUTANEOUS
  Administered 2023-03-29: 2 [IU] via SUBCUTANEOUS

## 2023-03-28 MED ORDER — IOHEXOL 350 MG/ML SOLN
75.0000 mL | Freq: Once | INTRAVENOUS | Status: AC | PRN
  Administered 2023-03-28: 75 mL via INTRAVENOUS

## 2023-03-28 MED ORDER — SODIUM CHLORIDE 0.9 % IV SOLN
1.0000 g | Freq: Once | INTRAVENOUS | Status: AC
  Administered 2023-03-28: 1 g via INTRAVENOUS
  Filled 2023-03-28: qty 10

## 2023-03-28 MED ORDER — ONDANSETRON HCL 4 MG/2ML IJ SOLN: 4.0000 mg | Freq: Four times a day (QID) | INTRAMUSCULAR | Status: DC | PRN

## 2023-03-28 MED ORDER — LACTATED RINGERS IV BOLUS: 1000.0000 mL | Freq: Once | INTRAVENOUS | Status: DC

## 2023-03-28 MED ORDER — ONDANSETRON HCL 4 MG PO TABS: 4.0000 mg | ORAL_TABLET | Freq: Four times a day (QID) | ORAL | Status: DC | PRN

## 2023-03-28 MED ORDER — ONDANSETRON 4 MG PO TBDP
4.0000 mg | ORAL_TABLET | Freq: Once | ORAL | Status: AC
  Administered 2023-03-28: 4 mg via ORAL
  Filled 2023-03-28: qty 1

## 2023-03-28 MED ORDER — ROSUVASTATIN CALCIUM 20 MG PO TABS
20.0000 mg | ORAL_TABLET | Freq: Every day | ORAL | Status: DC
  Administered 2023-03-29: 20 mg via ORAL
  Filled 2023-03-28: qty 1

## 2023-03-28 NOTE — ED Notes (Signed)
Pt transported to xray 

## 2023-03-28 NOTE — ED Triage Notes (Signed)
Pt to ED via triage accompanied with husband c/o emesis. Pt has hx of dementia and non verbal. Pt dry heaving/burping during assessment. Per husband pt has had several emesis episodes after breakfast. Pt was able to eat french toast this morning.

## 2023-03-28 NOTE — H&P (Addendum)
History and Physical    Patient: Stephanie Acevedo ZOX:[id number] DOB: [birth year] DOA: 03/28/2023 DOS: the patient was seen and examined on 03/28/2023 PCP: Octavia Heir, NP  Patient coming from: Home  Chief Complaint:  Chief Complaint  Patient presents with   Emesis   HPI: Stephanie Acevedo is a 77 y.o. female with medical history significant of dementia (Alzheimer's disease), diabetes, HLD, VT, torsades presents with intractable nausea and vomiting. Pt is unable to give history. History provided by husband at bedside. Patient had had breakfast today and then went for her daily walk with her husband.  During the walk she suddenly started to vomit and looked pale. She had 10 episodes of NBNB emesis followed by dry heaving. Per husband has advanced dementia and does not communicate verbally. No sick contacts. No recent illnesses, chills, fevers or diaphoresis etc.  No rectal bleeding, melena.  Patient goes to [facility] 4 days a week.  Patient has had good p.o. intake over the last week.  Imaging CT abdomen pelvis:  No evidence of bowel obstruction or other acute findings. Colonic diverticulosis, without radiographic evidence of diverticulitis.  ED course: Vital signs on arrival: BP 123/51, HR 49, temp 96.5, RR 15. Labs: WBC 18, HB 11.6, Gluc 319, BUN 29, trop 3>3. UA: Glucosuria, moderate leuks, bacteria. Received 1g IV Rocephin, 1 LR bolus, mIVF, IV zofran and PO Zofran. Pt  failed to tolerate PO challenge and continued to have emesis.   Review of Systems: As mentioned in the history of present illness. All other systems reviewed and are negative. Past Medical History:  Diagnosis Date   Dehydration    Dementia (HCC)    Diabetes mellitus without complication (HCC)    Hyperlipidemia    Hypokalemia    Hypomagnesemia 08/05/2020   Pt was started on Aricept and developed severe diarrhea, dehydration and hypokalemia that put her in NSVT and Torsades.  Med was stopped and electrolytes  repleted with no further arrythmias.   Torsades de pointes (HCC)    Ventricular tachycardia, non-sustained (HCC)    History reviewed. No pertinent surgical history. Social History:  reports that she has never smoked. She has never used smokeless tobacco. She reports that she does not drink alcohol and does not use drugs.  Allergies  Allergen Reactions   Aricept [Donepezil Hcl] Diarrhea    Family History  Problem Relation Age of Onset   Dementia Mother 17    Prior to Admission medications   Medication Sig Start Date End Date Taking? Authorizing Provider  Apoaequorin (PREVAGEN PO) Take by mouth.    [provider]  metFORMIN (GLUCOPHAGE-XR) 500 MG 24 hr tablet Take 1 tablet (500 mg total) by mouth daily with breakfast. 02/26/23   Fargo, Amy E, NP  Multiple Vitamin (MULTIVITAMIN WITH MINERALS) TABS tablet Take 1 tablet by mouth daily.    [provider]  rosuvastatin (CRESTOR) 20 MG tablet TAKE 1 TABLET BY MOUTH EVERY DAY 02/05/23   Octavia Heir, NP    Physical Exam: Vitals:   03/28/23 1700 03/28/23 1834 03/28/23 1843 03/28/23 2030  BP: (!) 126/57 100/84  (!) 99/50  Pulse: 63 98  81  Resp: 16 16  19   Temp:   (!) 97.5 F (36.4 C)   TempSrc:   Oral   SpO2: 99% 99%  98%  Weight:      Height:       General: Alert, no acute distress, frail-appearing, nonverbal, dry mucous membranes Cardio: Normal S1 and S2, RRR,  no r/m/g Pulm: CTAB, normal work of breathing Abdomen: Bowel sounds normal. Abdomen soft and non-tender.  Extremities: No peripheral edema.  Neuro: Cranial nerves grossly intact   Data Reviewed:  There are no new results to review at this time.  Assessment and Plan:  Intractable nausea vomiting likely secondary to UTI WBC 18.6, UA: Glucosuria, moderate leuks, bacteria -Admit to MedSurg -Vitals per floor routine -Up with assistance -ADAT -Delirium precautions -Continue maintenance IV fluids -Further 1 L bolus due to soft blood  pressures -Zofran 4 mg as needed -Follow up blood cultures, urine cultures -CXR to rule out pneumonia -Lovenox for DVT prophylaxis -PT OT -A.m. CBC CMP a.m.  Hyperlipidemia -Continue Crestor 20 mg  Diabetes CBG Glucose 316 on admission. Last A1c was 8.7 on 02/25/23 -Hold metformin -CBGs 4 times daily with meals and bedtime -Sliding scale insulin -Add long-acting insulin CBGs persistently over 200   Advance Care Planning:   Code Status: Prior DNR   Consults:   Family Communication: Husband updated at bedside   Severity of Illness: The appropriate patient status for this patient is INPATIENT. Inpatient status is judged to be reasonable and necessary in order to provide the required intensity of service to ensure the patient's safety. The patient's presenting symptoms, physical exam findings, and initial radiographic and laboratory data in the context of their chronic comorbidities is felt to place them at high risk for further clinical deterioration. Furthermore, it is not anticipated that the patient will be medically stable for discharge from the hospital within 2 midnights of admission.   * I certify that at the point of admission it is my clinical judgment that the patient will require inpatient hospital care spanning beyond 2 midnights from the point of admission due to high intensity of service, high risk for further deterioration and high frequency of surveillance required.*  Author: Rolm Gala, MD 03/28/2023 9:03 PM  For on call review [email or web address].

## 2023-03-28 NOTE — ED Notes (Signed)
This RN and NT to change pt depends. Large BM noted, pt cleaned, tolerated well, linen changed

## 2023-03-28 NOTE — ED Notes (Signed)
ED TO INPATIENT HANDOFF REPORT  ED Nurse Name and Phone #: Morrie Sheldon [phone]  S Name/Age/Gender Stephanie Acevedo 77 y.o. female Room/Bed: 004C/004C  Code Status   Code Status: DNR  Home/SNF/Other Home Patient oriented to: self Is this baseline?  Difficult to assess pt orientation, pt is non verbal- hx dementia, pt does follow simple commands  Triage Complete: Triage complete  Chief Complaint Vomiting [R11.10]  Triage Note Pt to ED via triage accompanied with husband c/o emesis. Pt has hx of dementia and non verbal. Pt dry heaving/burping during assessment. Per husband pt has had several emesis episodes after breakfast. Pt was able to eat french toast this morning.    Allergies Allergies  Allergen Reactions   Aricept [Donepezil Hcl] Diarrhea    Level of Care/Admitting Diagnosis ED Disposition     ED Disposition  Admit   Condition  --   Comment  Hospital Area: [facility] [[id number]]  Level of Care: Med-Surg [16]  May admit patient to [facility] or [facility] if equivalent level of care is available:: Yes  Covid Evaluation: Asymptomatic - no recent exposure (last 10 days) testing not required  Diagnosis: Vomiting [207392]  Admitting Physician: Cathleen Corti [[id number]]  Attending Physician: Cathleen Corti [[id number]]  Certification:: I certify this patient will need inpatient services for at least 2 midnights  Estimated Length of Stay: 2          B Medical/Surgery History Past Medical History:  Diagnosis Date   Dehydration    Dementia (HCC)    Diabetes mellitus without complication (HCC)    Hyperlipidemia    Hypokalemia    Hypomagnesemia 08/05/2020   Pt was started on Aricept and developed severe diarrhea, dehydration and hypokalemia that put her in NSVT and Torsades.  Med was stopped and electrolytes repleted with no further arrythmias.   Torsades de pointes (HCC)    Ventricular tachycardia, non-sustained (HCC)    History  reviewed. No pertinent surgical history.   A IV Location/Drains/Wounds Patient Lines/Drains/Airways Status     Active Line/Drains/Airways     Name Placement date Placement time Site Days   Peripheral IV 03/28/23 22 G Right Hand 03/28/23  1441  Hand  less than 1            Intake/Output Last 24 hours No intake or output data in the 24 hours ending 03/28/23 2309  Labs/Imaging Results for orders placed or performed during the hospital encounter of 03/28/23 (from the past 48 hour(s))  Comprehensive metabolic panel     Status: Abnormal   Collection Time: 03/28/23  2:45 PM  Result Value Ref Range   Sodium 136 135 - 145 mmol/L   Potassium 4.3 3.5 - 5.1 mmol/L   Chloride 102 98 - 111 mmol/L   CO2 22 22 - 32 mmol/L   Glucose, Bld 319 (H) 70 - 99 mg/dL    Comment: Glucose reference range applies only to samples taken after fasting for at least 8 hours.   BUN 29 (H) 8 - 23 mg/dL   Creatinine, Ser 1.30 0.44 - 1.00 mg/dL   Calcium 9.3 8.9 - 86.5 mg/dL   Total Protein 7.1 6.5 - 8.1 g/dL   Albumin 3.8 3.5 - 5.0 g/dL   AST 23 15 - 41 U/L   ALT 26 0 - 44 U/L   Alkaline Phosphatase 59 38 - 126 U/L   Total Bilirubin 0.4 0.3 - 1.2 mg/dL   GFR, Estimated >78 >46 mL/min  Comment: (NOTE) Calculated using the CKD-EPI Creatinine Equation (2021)    Anion gap 12 5 - 15    Comment: Performed at [facility], [address] [place], [place] [address]  CBC with Differential     Status: Abnormal   Collection Time: 03/28/23  2:45 PM  Result Value Ref Range   WBC 18.6 (H) 4.0 - 10.5 K/uL   RBC 3.81 (L) 3.87 - 5.11 MIL/uL   Hemoglobin 11.6 (L) 12.0 - 15.0 g/dL   HCT 47.8 (L) 29.5 - 62.1 %   MCV 92.9 80.0 - 100.0 fL   MCH 30.4 26.0 - 34.0 pg   MCHC 32.8 30.0 - 36.0 g/dL   RDW 30.8 65.7 - 84.6 %   Platelets 232 150 - 400 K/uL   nRBC 0.0 0.0 - 0.2 %   Neutrophils Relative % 91 %   Neutro Abs 17.0 (H) 1.7 - 7.7 K/uL   Lymphocytes Relative 5 %   Lymphs Abs 0.9 0.7 - 4.0 K/uL    Monocytes Relative 3 %   Monocytes Absolute 0.5 0.1 - 1.0 K/uL   Eosinophils Relative 0 %   Eosinophils Absolute 0.0 0.0 - 0.5 K/uL   Basophils Relative 0 %   Basophils Absolute 0.1 0.0 - 0.1 K/uL   Immature Granulocytes 1 %   Abs Immature Granulocytes 0.12 (H) 0.00 - 0.07 K/uL    Comment: Performed at [facility], [address] [place], [place] [address]  Troponin I (High Sensitivity)     Status: None   Collection Time: 03/28/23  2:45 PM  Result Value Ref Range   Troponin I (High Sensitivity) 3 <18 ng/L    Comment: (NOTE) Elevated high sensitivity troponin I (hsTnI) values and significant  changes across serial measurements may suggest ACS but many other  chronic and acute conditions are known to elevate hsTnI results.  Refer to the "Links" section for chest pain algorithms and additional  guidance. Performed at [facility], [address], [place], [place] [address]   I-stat chem 8, ED (not at [facility], [facility] or [facility])     Status: Abnormal   Collection Time: 03/28/23  2:51 PM  Result Value Ref Range   Sodium 137 135 - 145 mmol/L   Potassium 4.5 3.5 - 5.1 mmol/L   Chloride 105 98 - 111 mmol/L   BUN 31 (H) 8 - 23 mg/dL   Creatinine, Ser 2.44 0.44 - 1.00 mg/dL   Glucose, Bld 010 (H) 70 - 99 mg/dL    Comment: Glucose reference range applies only to samples taken after fasting for at least 8 hours.   Calcium, Ion 1.16 1.15 - 1.40 mmol/L   TCO2 23 22 - 32 mmol/L   Hemoglobin 11.6 (L) 12.0 - 15.0 g/dL   HCT 27.2 (L) 53.6 - 64.4 %  Troponin I (High Sensitivity)     Status: None   Collection Time: 03/28/23  4:26 PM  Result Value Ref Range   Troponin I (High Sensitivity) 3 <18 ng/L    Comment: (NOTE) Elevated high sensitivity troponin I (hsTnI) values and significant  changes across serial measurements may suggest ACS but many other  chronic and acute conditions are known to elevate hsTnI results.  Refer to the "Links" section for chest pain algorithms and additional   guidance. Performed at [facility], [address] [place], [place] [address]   Creatinine, serum     Status: None   Collection Time: 03/28/23  4:26 PM  Result Value Ref Range   Creatinine, Ser 0.79 0.44 - 1.00 mg/dL   GFR, Estimated >16 >10 mL/min    Comment: (NOTE) Calculated using the CKD-EPI Creatinine Equation (2021) Performed at [facility], [address], [place], [place] [address]   Urinalysis, Routine w reflex microscopic -Urine, Clean Catch     Status: Abnormal   Collection Time: 03/28/23  9:31 PM  Result Value Ref Range   Color, Urine STRAW (A) YELLOW   APPearance CLEAR CLEAR   Specific Gravity, Urine 1.032 (H) 1.005 - 1.030   pH 6.0 5.0 - 8.0   Glucose, UA >=500 (A) NEGATIVE mg/dL   Hgb urine dipstick NEGATIVE NEGATIVE   Bilirubin Urine NEGATIVE NEGATIVE   Ketones, ur 5 (A) NEGATIVE mg/dL   Protein, ur NEGATIVE NEGATIVE mg/dL   Nitrite NEGATIVE NEGATIVE   Leukocytes,Ua MODERATE (A) NEGATIVE   RBC / HPF 0-5 0 - 5 RBC/hpf   WBC, UA 11-20 0 - 5 WBC/hpf   Bacteria, UA RARE (A) NONE SEEN   Squamous Epithelial / HPF 0-5 0 - 5 /HPF    Comment: Performed at [facility], [address] [place], [place] [address]   DG Chest Portable 2 Views  Result Date: 03/28/2023 CLINICAL DATA:  100030 with leukocytosis and emesis. EXAM: CHEST  2 VIEW PORTABLE COMPARISON:  Portable chest 07/28/2020. FINDINGS: The heart is slightly enlarged. No vascular congestion is seen. The lungs are clear of infiltrates. No pleural effusion noted. There are granulomatous calcifications in the left upper lobe and biapical linear pleural calcifications. Multiple overlying monitor wires and metallic button artifacts. Thoracic cage is intact with mild kyphosis.  Osteopenic. IMPRESSION: 1. No acute chest findings. 2. Mild cardiomegaly. 3. Granulomatous calcifications. Electronically Signed   By: Almira Bar M.D.   On: 03/28/2023 22:56   CT ABDOMEN PELVIS W CONTRAST  Result Date:  03/28/2023 CLINICAL DATA:  Acute abdominal pain.  Suspected bowel obstruction. EXAM: CT ABDOMEN AND PELVIS WITH CONTRAST TECHNIQUE: Multidetector CT imaging of the abdomen and pelvis was performed using the standard protocol following bolus administration of intravenous contrast. RADIATION DOSE REDUCTION: This exam was performed according to the departmental dose-optimization program which includes automated exposure control, adjustment of the mA and/or kV according to patient size and/or use of iterative reconstruction technique. CONTRAST:  75mL OMNIPAQUE IOHEXOL 350 MG/ML SOLN COMPARISON:  Noncontrast CT on 07/28/2020 FINDINGS: Lower Chest: No acute findings. Hepatobiliary: No suspicious hepatic masses identified. Gallbladder is unremarkable. No evidence of biliary obstruction. Pancreas:  No mass or inflammatory changes. Spleen: Within normal limits in size and appearance. Adrenals/Urinary Tract: No suspicious masses identified. No evidence of ureteral calculi or hydronephrosis. Stomach/Bowel: No evidence of bowel obstruction, inflammatory process or abnormal fluid collections. Diverticulosis is seen mainly involving the sigmoid colon, however there is no evidence of diverticulitis. Normal appendix visualized. Vascular/Lymphatic: No pathologically enlarged lymph nodes. No acute vascular findings. Reproductive:  No mass or other significant abnormality. Other:  None. Musculoskeletal:  No suspicious bone lesions identified. IMPRESSION: No No evidence of bowel obstruction or other acute findings. Colonic diverticulosis, without radiographic evidence of diverticulitis. Electronically Signed   By: Danae Orleans M.D.   On: 03/28/2023 16:17    Pending Labs Unresulted Labs (From admission, onward)     Start     Ordered   04/04/23 0500  Creatinine, serum  (enoxaparin (LOVENOX)    CrCl >/= 30 ml/min)  Weekly,   R     Comments: while  on enoxaparin therapy    03/28/23 2207   03/29/23 0500  CBC  Tomorrow morning,   R         03/28/23 2207   03/29/23 0500  Comprehensive metabolic panel  Tomorrow morning,   R        03/28/23 2207   03/28/23 2205  CBC  (enoxaparin (LOVENOX)    CrCl >/= 30 ml/min)  Once,   R       Comments: Baseline for enoxaparin therapy IF NOT ALREADY DRAWN.  Notify MD if PLT < 100 K.    03/28/23 2207   03/28/23 2126  Culture, blood (Routine X 2) w Reflex to ID Panel  BLOOD CULTURE X 2,   R (with TIMED occurrences)      03/28/23 2125   03/28/23 2050  Urine Culture  Once,   URGENT       Question:  Indication  Answer:  Altered mental status (if no other cause identified)   03/28/23 2049            Vitals/Pain Today's Vitals   03/28/23 2030 03/28/23 2100 03/28/23 2200 03/28/23 2246  BP: (!) 99/50 (!) 107/57 (!) 105/42   Pulse: 81 78 71   Resp: 19 18 18    Temp:    98 F (36.7 C)  TempSrc:    Oral  SpO2: 98% 100% 100%   Weight:      Height:        Isolation Precautions No active isolations  Medications Medications  rosuvastatin (CRESTOR) tablet 20 mg (has no administration in time range)  enoxaparin (LOVENOX) injection 30 mg (has no administration in time range)  lactated ringers infusion (has no administration in time range)  acetaminophen (TYLENOL) tablet 650 mg (has no administration in time range)    Or  acetaminophen (TYLENOL) suppository 650 mg (has no administration in time range)  ondansetron (ZOFRAN) tablet 4 mg (has no administration in time range)    Or  ondansetron (ZOFRAN) injection 4 mg (has no administration in time range)  insulin aspart (novoLOG) injection 0-15 Units (has no administration in time range)  cefTRIAXone (ROCEPHIN) 1 g in sodium chloride 0.9 % 100 mL IVPB (has no administration in time range)  ondansetron (ZOFRAN) injection 4 mg (4 mg Intravenous Given 03/28/23 1445)  lactated ringers bolus 1,000 mL (0 mLs Intravenous Stopped 03/28/23 1928)  iohexol (OMNIPAQUE) 350 MG/ML injection 75 mL (75 mLs Intravenous Contrast Given 03/28/23 1528)   ondansetron (ZOFRAN-ODT) disintegrating tablet 4 mg (4 mg Oral Given 03/28/23 1843)  cefTRIAXone (ROCEPHIN) 1 g in sodium chloride 0.9 % 100 mL IVPB (0 g Intravenous Stopped 03/28/23 2206)  lactated ringers bolus 1,000 mL (1,000 mLs Intravenous New Bag/Given 03/28/23 2245)    Mobility walks     Focused Assessments Pulmonary Assessment Handoff:  Lung sounds:   O2 Device: Room Air      R Recommendations: See Admitting Provider Note  Report given to:   Additional Notes:

## 2023-03-28 NOTE — ED Notes (Signed)
Hospitalist at bedside 

## 2023-03-28 NOTE — ED Provider Notes (Addendum)
[place] EMERGENCY DEPARTMENT AT [facility] Provider Note   CSN: [id number] Arrival date & time: 03/28/23  1307     History  Chief Complaint  Patient presents with   Emesis    Stephanie Acevedo is a 77 y.o. female.  HPI    Pt comes in w/ cc of vomiting. Patient has past medical history of diabetes, dementia.  She is brought here by her husband with chief complaint of vomiting.  Patient's husband indicates that patient started vomiting after breakfast today.  She has had about 10+ episodes of emesis, nonbilious.  She is also dry heaving now.  Patient has advanced dementia and does not communicate freely.  There has not been any recent illnesses no fevers or chills.  P.o. intake has been normal.  No associated diarrhea.  Patient has no abdominal surgical history.  Home Medications Prior to Admission medications   Medication Sig Start Date End Date Taking? Authorizing Provider  Apoaequorin (PREVAGEN PO) Take by mouth.    [provider]  metFORMIN (GLUCOPHAGE-XR) 500 MG 24 hr tablet Take 1 tablet (500 mg total) by mouth daily with breakfast. 02/26/23   Fargo, Amy E, NP  Multiple Vitamin (MULTIVITAMIN WITH MINERALS) TABS tablet Take 1 tablet by mouth daily.    [provider]  rosuvastatin (CRESTOR) 20 MG tablet TAKE 1 TABLET BY MOUTH EVERY DAY 02/05/23   Octavia Heir, NP      Allergies    Aricept Palma Holter hcl]    Review of Systems   Review of Systems  All other systems reviewed and are negative.   Physical Exam Updated Vital Signs BP (!) 99/50   Pulse 81   Temp (!) 97.5 F (36.4 C) (Oral)   Resp 19   Ht 5\' 5"  (1.651 m)   Wt 51.3 kg   SpO2 98%   BMI 18.80 kg/m  Physical Exam Vitals and nursing note reviewed.  Constitutional:      Appearance: She is well-developed.  HENT:     Head: Atraumatic.     Mouth/Throat:     Mouth: Mucous membranes are dry.  Cardiovascular:     Rate and Rhythm: Normal rate.  Pulmonary:     Effort: Pulmonary  effort is normal.  Abdominal:     Tenderness: There is abdominal tenderness. There is no guarding or rebound.  Musculoskeletal:     Cervical back: Normal range of motion and neck supple.  Skin:    General: Skin is warm and dry.  Neurological:     Mental Status: She is alert and oriented to person, place, and time.     ED Results / Procedures / Treatments   Labs (all labs ordered are listed, but only abnormal results are displayed) Labs Reviewed  COMPREHENSIVE METABOLIC PANEL - Abnormal; Notable for the following components:      Result Value   Glucose, Bld 319 (*)    BUN 29 (*)    All other components within normal limits  CBC WITH DIFFERENTIAL/PLATELET - Abnormal; Notable for the following components:   WBC 18.6 (*)    RBC 3.81 (*)    Hemoglobin 11.6 (*)    HCT 35.4 (*)    Neutro Abs 17.0 (*)    Abs Immature Granulocytes 0.12 (*)    All other components within normal limits  I-STAT CHEM 8, ED - Abnormal; Notable for the following components:   BUN 31 (*)    Glucose, Bld 316 (*)    Hemoglobin 11.6 (*)  HCT 34.0 (*)    All other components within normal limits  URINE CULTURE  URINALYSIS, ROUTINE W REFLEX MICROSCOPIC  TROPONIN I (HIGH SENSITIVITY)  TROPONIN I (HIGH SENSITIVITY)    EKG EKG Interpretation Date/Time:  Sunday March 28 2023 13:18:45 EDT Ventricular Rate:  50 PR Interval:  136 QRS Duration:  103 QT Interval:  488 QTC Calculation: 445 R Axis:   18  Text Interpretation: Sinus rhythm Low voltage, precordial leads RSR' in V1 or V2, right VCD or RVH No acute changes No significant change since last tracing Confirmed by Derwood Kaplan [phone]) on 03/28/2023 1:21:03 PM  Radiology CT ABDOMEN PELVIS W CONTRAST  Result Date: 03/28/2023 CLINICAL DATA:  Acute abdominal pain.  Suspected bowel obstruction. EXAM: CT ABDOMEN AND PELVIS WITH CONTRAST TECHNIQUE: Multidetector CT imaging of the abdomen and pelvis was performed using the standard protocol following bolus  administration of intravenous contrast. RADIATION DOSE REDUCTION: This exam was performed according to the departmental dose-optimization program which includes automated exposure control, adjustment of the mA and/or kV according to patient size and/or use of iterative reconstruction technique. CONTRAST:  75mL OMNIPAQUE IOHEXOL 350 MG/ML SOLN COMPARISON:  Noncontrast CT on 07/28/2020 FINDINGS: Lower Chest: No acute findings. Hepatobiliary: No suspicious hepatic masses identified. Gallbladder is unremarkable. No evidence of biliary obstruction. Pancreas:  No mass or inflammatory changes. Spleen: Within normal limits in size and appearance. Adrenals/Urinary Tract: No suspicious masses identified. No evidence of ureteral calculi or hydronephrosis. Stomach/Bowel: No evidence of bowel obstruction, inflammatory process or abnormal fluid collections. Diverticulosis is seen mainly involving the sigmoid colon, however there is no evidence of diverticulitis. Normal appendix visualized. Vascular/Lymphatic: No pathologically enlarged lymph nodes. No acute vascular findings. Reproductive:  No mass or other significant abnormality. Other:  None. Musculoskeletal:  No suspicious bone lesions identified. IMPRESSION: No No evidence of bowel obstruction or other acute findings. Colonic diverticulosis, without radiographic evidence of diverticulitis. Electronically Signed   By: Danae Orleans M.D.   On: 03/28/2023 16:17    Procedures Procedures    Medications Ordered in ED Medications  lactated ringers infusion (has no administration in time range)  cefTRIAXone (ROCEPHIN) 1 g in sodium chloride 0.9 % 100 mL IVPB (has no administration in time range)  ondansetron (ZOFRAN) injection 4 mg (4 mg Intravenous Given 03/28/23 1445)  lactated ringers bolus 1,000 mL (0 mLs Intravenous Stopped 03/28/23 1928)  iohexol (OMNIPAQUE) 350 MG/ML injection 75 mL (75 mLs Intravenous Contrast Given 03/28/23 1528)  ondansetron (ZOFRAN-ODT)  disintegrating tablet 4 mg (4 mg Oral Given 03/28/23 1843)    ED Course/ Medical Decision Making/ A&P                             Medical Decision Making Amount and/or Complexity of Data Reviewed Labs: ordered. Radiology: ordered.  Risk Prescription drug management. Decision regarding hospitalization.   77 year old female comes in with chief complaint of nausea and vomiting.  Patient accompanied by her husband, provides substantial part of the history.  Patient is pertinent past medical history of dementia, hypertension, hyperlipidemia.  She has no surgical history.  Differential diagnosis for this patient includes small bowel obstruction, ileus, pancreatitis, cholecystitis, symptomatic cholelithiasis, tumor, systemic infection.  Initial plan is to get basic labs and CT abdomen and pelvis with contrast.  Troponin also ordered along with EKG to rule out ACS.  8:52 PM Patient's white count was elevated at 18.6.  CT abdomen and pelvis now completed and is reassuring.  CT independently interpreted, there is no evidence of perforation.  Patient does have elevated BUN to creatinine ratio.  Patient has received IV fluids.  There is no AKI.  P.o. challenge has been initiated. Per husband, patient has not had any true emesis since the last 3 hours. If patient passes oral challenge then we will discharge her with return precautions.   8:52 PM Patient has failed p.o. challenge. She continues to have bilious emesis. CT abdomen pelvis was reassuring. I ordered In-N-Out cath and urine analysis with urine culture. I will give her ceftriaxone IV given elevated white count in the setting of unclear diagnosis and her age, comorbidities and inability to provide meaningful history.  Patient's husband made aware of the plan.  Final Clinical Impression(s) / ED Diagnoses Final diagnoses:  Bilious vomiting with nausea  Dehydration    Rx / DC Orders ED Discharge Orders     None          Derwood Kaplan, MD 03/28/23 Judith Blonder, MD 03/28/23 2053

## 2023-03-29 ENCOUNTER — Encounter: Payer: Self-pay | Admitting: Orthopedic Surgery

## 2023-03-29 DIAGNOSIS — R112 Nausea with vomiting, unspecified: Principal | ICD-10-CM

## 2023-03-29 LAB — COMPREHENSIVE METABOLIC PANEL
ALT: 20 U/L (ref 0–44)
AST: 22 U/L (ref 15–41)
Albumin: 3.2 g/dL — ABNORMAL LOW (ref 3.5–5.0)
Alkaline Phosphatase: 45 U/L (ref 38–126)
Anion gap: 7 (ref 5–15)
BUN: 16 mg/dL (ref 8–23)
CO2: 25 mmol/L (ref 22–32)
Calcium: 8.9 mg/dL (ref 8.9–10.3)
Chloride: 107 mmol/L (ref 98–111)
Creatinine, Ser: 0.8 mg/dL (ref 0.44–1.00)
GFR, Estimated: >60 mL/min (ref >60)
Glucose, Bld: 162 mg/dL — ABNORMAL HIGH (ref 70–99)
Potassium: 3.7 mmol/L (ref 3.5–5.1)
Sodium: 139 mmol/L (ref 135–145)
Total Bilirubin: 0.6 mg/dL (ref 0.3–1.2)
Total Protein: 6.2 g/dL — ABNORMAL LOW (ref 6.5–8.1)

## 2023-03-29 LAB — CBC
HCT: 32 % — ABNORMAL LOW (ref 36.0–46.0)
Hemoglobin: 10.3 g/dL — ABNORMAL LOW (ref 12.0–15.0)
MCH: 29.4 pg (ref 26.0–34.0)
MCHC: 32.2 g/dL (ref 30.0–36.0)
MCV: 91.4 fL (ref 80.0–100.0)
Platelets: 206 10*3/uL (ref 150–400)
RBC: 3.5 MIL/uL — ABNORMAL LOW (ref 3.87–5.11)
RDW: 12.7 % (ref 11.5–15.5)
WBC: 13.7 10*3/uL — ABNORMAL HIGH (ref 4.0–10.5)
nRBC: 0 % (ref 0.0–0.2)

## 2023-03-29 LAB — GLUCOSE, CAPILLARY
Glucose-Capillary: 162 mg/dL — ABNORMAL HIGH (ref 70–99)
Glucose-Capillary: 124 mg/dL — ABNORMAL HIGH (ref 70–99)

## 2023-03-29 MED ORDER — CEPHALEXIN 500 MG PO CAPS: 500.0000 mg | ORAL_CAPSULE | Freq: Two times a day (BID) | ORAL | 0 refills | Status: AC

## 2023-03-29 MED ORDER — ONDANSETRON HCL 4 MG PO TABS: 4.0000 mg | ORAL_TABLET | Freq: Three times a day (TID) | ORAL | 0 refills | Status: AC | PRN

## 2023-03-29 MED ORDER — SODIUM CHLORIDE 0.9 % IV SOLN
1.0000 g | Freq: Once | INTRAVENOUS | Status: AC
  Administered 2023-03-29: 1 g via INTRAVENOUS
  Filled 2023-03-29: qty 10

## 2023-03-29 NOTE — Progress Notes (Signed)
Pt discharged to home with husband via wheelchair with all belongings and paperwork.

## 2023-03-29 NOTE — Evaluation (Signed)
Occupational Therapy Evaluation Patient Details Name: Stephanie Acevedo MRN: [id number] DOB: 05-24-1946 Today's Date: 03/29/2023   History of Present Illness 77 yo admitted with intractable N/V, likely secondary to UTI; per H&P pt is non-speaking, goes on daily walks with her husband, attends [employer] [facility] 4 days/week; has a past medical history of Dehydration, Dementia (HCC), Diabetes mellitus without complication (HCC), Hyperlipidemia, Hypokalemia, Hypomagnesemia (08/05/2020), Torsades de pointes (HCC), and Ventricular tachycardia, non-sustained (HCC).   Clinical Impression   Patient evaluated by Occupational Therapy with no further acute OT needs identified. All education has been completed and the patient has no further questions. See below for any follow-up Occupational Therapy or equipment needs. OT to sign off. Thank you for referral.     *Note to have moisture related skin concerns in peri area and could benefit from continued education to spouse on barrier creams and hygiene to prevent skin break down. Pt wears briefs during the day. Spouse reports changing them in the morning and evening with education given this session on frequent changes. Spouse reports he is not sure how long it is between changes at [facility] memory care 4 days per week.    Recommendations for follow up therapy are one component of a multi-disciplinary discharge planning process, led by the attending physician.  Recommendations may be updated based on patient status, additional functional criteria and insurance authorization.   Assistance Recommended at Discharge None  Patient can return home with the following      Functional Status Assessment  Patient has had a recent decline in their functional status and demonstrates the ability to make significant improvements in function in a reasonable and predictable amount of time.  Equipment Recommendations  None recommended by OT    Recommendations for  Other Services       Precautions / Restrictions Precautions Precautions: Fall Precaution Comments: Fall risk is present; greatly reduced with husband Stephanie Acevedo present Restrictions Weight Bearing Restrictions: No      Mobility Bed Mobility               General bed mobility comments: oob on arrival and noted to have incontinence on bed surfaec. pt does wear briefs at baseline    Transfers Overall transfer level: Needs assistance Equipment used: 1 person hand held assist Transfers: Sit to/from Stand Sit to Stand: Min guard           General transfer comment: using arm rest of chair and BSC      Balance Overall balance assessment: Mild deficits observed, not formally tested                                         ADL either performed or assessed with clinical judgement   ADL Overall ADL's : Needs assistance/impaired Eating/Feeding: Set up   Grooming: Min guard Grooming Details (indicate cue type and reason): setup for backward chaining of task and cues to continue task. pt terminates prematurely     Lower Body Bathing: Moderate assistance;Sit to/from stand Lower Body Bathing Details (indicate cue type and reason): pt noted to have some break down at sacrum and skin moisture concerns. Spouse educated on talking to MD team about barriers to help with skin protections     Lower Body Dressing: Moderate assistance   Toilet Transfer: Min guard   Toileting- Clothing Manipulation and Hygiene: Moderate assistance Toileting - Clothing Manipulation Details (indicate cue  type and reason): barrier cream applied     Functional mobility during ADLs: Supervision/safety (baseline per spouse) General ADL Comments: near or at baseline per discussion with spouse     Vision Baseline Vision/History: 0 No visual deficits Ability to See in Adequate Light: 0 Adequate Patient Visual Report: No change from baseline Vision Assessment?: No apparent visual deficits      Perception     Praxis      Pertinent Vitals/Pain Pain Assessment Faces Pain Scale: No hurt     Hand Dominance Right   Extremity/Trunk Assessment Upper Extremity Assessment Upper Extremity Assessment: Overall WFL for tasks assessed   Lower Extremity Assessment Lower Extremity Assessment: Defer to PT evaluation   Cervical / Trunk Assessment Cervical / Trunk Assessment: Normal   Communication Communication Communication: Other (comment) (mainly nonverbal per chart)   Cognition Arousal/Alertness: Awake/alert Behavior During Therapy: WFL for tasks assessed/performed Overall Cognitive Status: History of cognitive impairments - at baseline                                 General Comments: pt following simple 1 step commands with cueing needed to continue     General Comments  skin concerns in peri area due to moisture. barrier cream applied    Exercises     Shoulder Instructions      Home Living Family/patient expects to be discharged to:: Private residence Living Arrangements: Spouse/significant other Available Help at Discharge: Family Type of Home: House Home Access: Stairs to enter Secretary/administrator of Steps: 1   Home Layout: One level     Bathroom Shower/Tub: Tub/shower unit;Curtain   Bathroom Toilet: Handicapped height     Home Equipment: Agricultural consultant (2 wheels);Rollator (4 wheels);Cane - single point;Shower seat   Additional Comments: goes to [employer] care 4 days per week, walks daily with spouse      Prior Functioning/Environment Prior Level of Function : Needs assist  Cognitive Assist : ADLs (cognitive)   ADLs (Cognitive): Intermittent cues       Mobility Comments: Typically walks with handheld assist from husband ADLs Comments: Husband assist; typically stands        OT Problem List:        OT Treatment/Interventions:      OT Goals(Current goals can be found in the care plan section)    OT Frequency:       Co-evaluation              AM-PAC OT "6 Clicks" Daily Activity     Outcome Measure Help from another person eating meals?: None Help from another person taking care of personal grooming?: None Help from another person toileting, which includes using toliet, bedpan, or urinal?: A Little Help from another person bathing (including washing, rinsing, drying)?: A Little Help from another person to put on and taking off regular upper body clothing?: A Little Help from another person to put on and taking off regular lower body clothing?: A Little 6 Click Score: 20   End of Session Nurse Communication: Mobility status  Activity Tolerance: Patient tolerated treatment well Patient left: in chair;with call bell/phone within reach;with chair alarm set (no alarm box and RN staff made aware)  OT Visit Diagnosis: Muscle weakness (generalized) (M62.81)                Time: 9518-8416 OT Time Calculation (min): 11 min Charges:  OT General Charges $OT Visit: 1 Visit  OT Evaluation $OT Eval Low Complexity: 1 Low   Brynn, OTR/L  Acute Rehabilitation Services Office: [phone] .   Mateo Flow 03/29/2023, 1:57 PM

## 2023-03-29 NOTE — Discharge Summary (Signed)
Physician Discharge Summary   Patient: Stephanie Acevedo MRN: [id number] DOB: February 22, 1946  Admit date:     03/28/2023  Discharge date: 03/29/2023  Discharge Physician: Lynden Oxford  PCP: Octavia Heir, NP  Recommendations at discharge: Follow-up with PCP in 1 week.   Follow-up Information     Octavia Heir, NP. Schedule an appointment as soon as possible for a visit in 1 week(s).   Specialty: Adult Health Nurse Practitioner Why: with BMP lab to look at kidney and electrolytes Contact information: [address] [place] [address] [phone]                Discharge Diagnoses: Principal Problem:   Vomiting Active Problems:   Intractable nausea and vomiting  Hospital Course: Patient presented to the hospital with complaints of sudden onset of intractable nausea and vomiting.  Has been at bedside reports that the patient had an almost 12 vomiting episode without any blood.  Patient denied having any abdominal pain. CT abdomen was performed which was negative for any acute intra-abdominal pathology. Patient did have evidence of leukocytosis without any significant evidence of dehydration or electrolyte abnormality. Patient was treated with IV hydration. Also was given IV ceftriaxone. There is some concern for UTI. Although I do not suspect the patient actually suffering from UTI and I do not feel that the UTI was the reason for nausea and vomiting get that the patient is responding well to the therapy will continue the antibiotic for a few more days. As needed Zofran on discharge. Patient is able to tolerate oral diet.  Patient has history of HLD, type 2 diabetes mellitus, dementia, nonverbal at baseline, will be resuming home regimen on discharge.  Consultants:  none  Procedures performed:  none  DISCHARGE MEDICATION: Allergies as of 03/29/2023       Reactions   Aricept [donepezil Hcl] Diarrhea        Medication List     TAKE these medications     cephALEXin 500 MG capsule Commonly known as: KEFLEX Take 1 capsule (500 mg total) by mouth 2 (two) times daily for 4 days.   metFORMIN 500 MG 24 hr tablet Commonly known as: GLUCOPHAGE-XR Take 1 tablet (500 mg total) by mouth daily with breakfast.   multivitamin with minerals Tabs tablet Take 1 tablet by mouth daily.   ondansetron 4 MG tablet Commonly known as: Zofran Take 1 tablet (4 mg total) by mouth every 8 (eight) hours as needed for nausea or vomiting.   PREVAGEN PO Take 1 capsule by mouth daily.   rosuvastatin 20 MG tablet Commonly known as: CRESTOR TAKE 1 TABLET BY MOUTH EVERY DAY       Disposition: Home Diet recommendation: Regular diet  Discharge Exam: Vitals:   03/28/23 2336 03/29/23 0916 03/29/23 1210 03/29/23 1412  BP: (!) 90/40 (!) 98/39 (!) 97/52 (!) 103/44  Pulse: 98 (!) 56 65 75  Resp: 20 16 17 17   Temp: 98.1 F (36.7 C) 97.9 F (36.6 C) 97.8 F (36.6 C) 98.4 F (36.9 C)  TempSrc: Oral Oral Axillary Oral  SpO2: 97% 100% 99% 95%  Weight:      Height:       General: Appear in no distress; no visible Abnormal Neck Mass Or lumps, Conjunctiva normal Cardiovascular: S1 and S2 Present, no Murmur, Respiratory: good respiratory effort, Bilateral Air entry present and CTA, no Crackles, no wheezes Abdomen: Bowel Sound present, Non tender  Extremities: no Pedal edema Neurology: alert and oriented to self  Filed Weights   03/28/23 1327  Weight: 51.3 kg   Condition at discharge: stable  The results of significant diagnostics from this hospitalization (including imaging, microbiology, ancillary and laboratory) are listed below for reference.   Imaging Studies: DG Chest Portable 2 Views  Result Date: 03/28/2023 CLINICAL DATA:  100030 with leukocytosis and emesis. EXAM: CHEST  2 VIEW PORTABLE COMPARISON:  Portable chest 07/28/2020. FINDINGS: The heart is slightly enlarged. No vascular congestion is seen. The lungs are clear of infiltrates. No pleural  effusion noted. There are granulomatous calcifications in the left upper lobe and biapical linear pleural calcifications. Multiple overlying monitor wires and metallic button artifacts. Thoracic cage is intact with mild kyphosis.  Osteopenic. IMPRESSION: 1. No acute chest findings. 2. Mild cardiomegaly. 3. Granulomatous calcifications. Electronically Signed   By: Almira Bar M.D.   On: 03/28/2023 22:56   CT ABDOMEN PELVIS W CONTRAST  Result Date: 03/28/2023 CLINICAL DATA:  Acute abdominal pain.  Suspected bowel obstruction. EXAM: CT ABDOMEN AND PELVIS WITH CONTRAST TECHNIQUE: Multidetector CT imaging of the abdomen and pelvis was performed using the standard protocol following bolus administration of intravenous contrast. RADIATION DOSE REDUCTION: This exam was performed according to the departmental dose-optimization program which includes automated exposure control, adjustment of the mA and/or kV according to patient size and/or use of iterative reconstruction technique. CONTRAST:  75mL OMNIPAQUE IOHEXOL 350 MG/ML SOLN COMPARISON:  Noncontrast CT on 07/28/2020 FINDINGS: Lower Chest: No acute findings. Hepatobiliary: No suspicious hepatic masses identified. Gallbladder is unremarkable. No evidence of biliary obstruction. Pancreas:  No mass or inflammatory changes. Spleen: Within normal limits in size and appearance. Adrenals/Urinary Tract: No suspicious masses identified. No evidence of ureteral calculi or hydronephrosis. Stomach/Bowel: No evidence of bowel obstruction, inflammatory process or abnormal fluid collections. Diverticulosis is seen mainly involving the sigmoid colon, however there is no evidence of diverticulitis. Normal appendix visualized. Vascular/Lymphatic: No pathologically enlarged lymph nodes. No acute vascular findings. Reproductive:  No mass or other significant abnormality. Other:  None. Musculoskeletal:  No suspicious bone lesions identified. IMPRESSION: No No evidence of bowel  obstruction or other acute findings. Colonic diverticulosis, without radiographic evidence of diverticulitis. Electronically Signed   By: Danae Orleans M.D.   On: 03/28/2023 16:17    Microbiology: Results for orders placed or performed during the hospital encounter of 03/28/23  Culture, blood (Routine X 2) w Reflex to ID Panel     Status: None (Preliminary result)   Collection Time: 03/28/23  9:26 PM   Specimen: BLOOD  Result Value Ref Range Status   Specimen Description BLOOD LEFT ANTECUBITAL  Final   Special Requests   Final    BOTTLES DRAWN AEROBIC AND ANAEROBIC Blood Culture adequate volume   Culture   Final    NO GROWTH < 12 HOURS Performed at [facility], [address] [place], [place] [address]    Report Status PENDING  Incomplete  Culture, blood (Routine X 2) w Reflex to ID Panel     Status: None (Preliminary result)   Collection Time: 03/28/23  9:31 PM   Specimen: BLOOD  Result Value Ref Range Status   Specimen Description BLOOD BLOOD LEFT ARM  Final   Special Requests   Final    BOTTLES DRAWN AEROBIC ONLY Blood Culture adequate volume   Culture   Final    NO GROWTH < 12 HOURS Performed at [facility], [address] [place], [place] [address]    Report Status PENDING  Incomplete  Labs: CBC: Recent Labs  Lab 03/28/23 1445 03/28/23 1451 03/29/23 0404  WBC 18.6*  --  13.7*  NEUTROABS 17.0*  --   --   HGB 11.6* 11.6* 10.3*  HCT 35.4* 34.0* 32.0*  MCV 92.9  --  91.4  PLT 232  --  206   Basic Metabolic Panel: Recent Labs  Lab 03/28/23 1445 03/28/23 1451 03/28/23 1626 03/29/23 0404  NA 136 137  --  139  K 4.3 4.5  --  3.7  CL 102 105  --  107  CO2 22  --   --  25  GLUCOSE 319* 316*  --  162*  BUN 29* 31*  --  16  CREATININE 0.81 0.70 0.79 0.80  CALCIUM 9.3  --   --  8.9   Liver Function Tests: Recent Labs  Lab 03/28/23 1445 03/29/23 0404  AST 23 22  ALT 26 20  ALKPHOS 59 45  BILITOT 0.4 0.6  PROT 7.1 6.2*  ALBUMIN 3.8 3.2*    CBG: Recent Labs  Lab 03/29/23 0918 03/29/23 1238  GLUCAP 162* 124*    Discharge time spent: greater than 30 minutes.  Author: Lynden Oxford, MD  [facility] 03/29/2023

## 2023-03-29 NOTE — Care Management CC44 (Signed)
Condition Code 44 Documentation Completed  Patient Details  Name: Ramyah Acevedo MRN: [id number] Date of Birth: 12-18-1945   Condition Code 44 given:  Yes Patient signature on Condition Code 44 notice:  Yes Documentation of 2 MD's agreement:  Yes Code 44 added to claim:  Yes    Kingsley Plan, RN 03/29/2023, 2:56 PM

## 2023-03-29 NOTE — Progress Notes (Signed)
Discharge instructions given to pt husband Cristan Hout. Husband verbalized understanding of instructions and had no further questions.

## 2023-03-29 NOTE — Evaluation (Signed)
Physical Therapy Evaluation and Discharge Patient Details Name: Stephanie Acevedo MRN: [id number] DOB: 1946/05/14 Today's Date: 03/29/2023  History of Present Illness  76 yo admitted with intractable N/V, likely secondary to UTI; per H&P pt is non-speaking, goes on daily walks with her husband, attends [facility] 4 days/week; has a past medical history of Dehydration, Dementia (HCC), Diabetes mellitus without complication (HCC), Hyperlipidemia, Hypokalemia, Hypomagnesemia (08/05/2020), Torsades de pointes (HCC), and Ventricular tachycardia, non-sustained (HCC).  Clinical Impression   Patient evaluated by Physical Therapy with no further acute PT needs identified; Pt is at or very near her functional baseline, seems to enjoy walking the halls with her husband; Discussed case with OT as well;  All education has been completed and the patient's husband has no further questions. In considering options for discharge, I value going back to familiar environment, caregivers, and routines for patients with dementia. See below for any follow-up Physical Therapy or equipment needs. PT is signing off. Thank you for this referral.         Assistance Recommended at Discharge Frequent or constant Supervision/Assistance  If plan is discharge home, recommend the following:  Can travel by private vehicle  A little help with walking and/or transfers;Assistance with cooking/housework;Assist for transportation;Help with stairs or ramp for entrance        Equipment Recommendations None recommended by PT  Recommendations for Other Services  Other (comment) ([facility] follow up as Outpt)    Functional Status Assessment Patient has not had a recent decline in their functional status     Precautions / Restrictions Precautions Precautions: Fall Precaution Comments: Fall risk is present; greatly reduced with husband [name] present Restrictions Weight Bearing Restrictions: No       Mobility  Bed Mobility Overal bed mobility: Needs Assistance Bed Mobility: Supine to Sit     Supine to sit: Min assist     General bed mobility comments: Multimodal cues to move to EOB and sit up; visual cues to scoot forward and get feet to teh    Transfers Overall transfer level: Needs assistance Equipment used: 1 person hand held assist Transfers: Sit to/from Stand Sit to Stand: Min assist           General transfer comment: Min handheld assist to "lead" pt to come forward and stand    Ambulation/Gait Ambulation/Gait assistance: Min assist Gait Distance (Feet): 600 Feet Assistive device: 1 person hand held assist Gait Pattern/deviations: Step-through pattern       General Gait Details: Handheld assist from husband; at or very near baseline  Stairs            Wheelchair Mobility     Tilt Bed    Modified Rankin (Stroke Patients Only)       Balance Overall balance assessment: Mild deficits observed, not formally tested                                           Pertinent Vitals/Pain Pain Assessment Pain Assessment: Faces Faces Pain Scale: No hurt    Home Living Family/patient expects to be discharged to:: Private residence Living Arrangements: Spouse/significant other Available Help at Discharge: Family Type of Home: House Home Access: Stairs to enter   Secretary/administrator of Steps: 1   Home Layout: One level Home Equipment: Agricultural consultant (2 wheels);Rollator (4 wheels);Cane - single point;Shower seat Additional Comments: goes to [employer]  care 4 days per week, walks daily with spouse    Prior Function Prior Level of Function : Needs assist  Cognitive Assist : ADLs (cognitive)           Mobility Comments: Typically walks with handheld assist from husband ADLs Comments: Husband assist; typically stands     Hand Dominance   Dominant Hand: Right    Extremity/Trunk Assessment   Upper Extremity  Assessment Upper Extremity Assessment: Defer to OT evaluation    Lower Extremity Assessment Lower Extremity Assessment: Overall WFL for tasks assessed (Pt pauses at initial stand before she walks due to longstanding hip pain)       Communication   Communication: Other (comment) (mainly nonverbal per chart)  Cognition Arousal/Alertness: Awake/alert Behavior During Therapy: WFL for tasks assessed/performed Overall Cognitive Status: History of cognitive impairments - at baseline                                          General Comments      Exercises     Assessment/Plan    PT Assessment    PT Problem List         PT Treatment Interventions      PT Goals (Current goals can be found in the Care Plan section)  Acute Rehab PT Goals Patient Stated Goal: Agreeable to getting up and walking PT Goal Formulation: All assessment and education complete, DC therapy    Frequency       Co-evaluation               AM-PAC PT "6 Clicks" Mobility  Outcome Measure                  End of Session   Activity Tolerance: Patient tolerated treatment well Patient left: Other (comment) (Walking with husband and OT) Nurse Communication: Mobility status      Time: 0347-4259 PT Time Calculation (min) (ACUTE ONLY): 22 min   Charges:   PT Evaluation $PT Eval Low Complexity: 1 Low   PT General Charges $$ ACUTE PT VISIT: 1 Visit         Van Clines, PT  Acute Rehabilitation Services Office [phone] Secure Chat welcomed   Levi Aland 03/29/2023, 1:18 PM

## 2023-03-29 NOTE — Care Management Obs Status (Signed)
MEDICARE OBSERVATION STATUS NOTIFICATION   Patient Details  Name: Stephanie Acevedo MRN: [id number] Date of Birth: [birth year]   Medicare Observation Status Notification Given:  Yes    Kingsley Plan, RN 03/29/2023, 2:56 PM

## 2023-03-30 ENCOUNTER — Encounter: Payer: Self-pay | Admitting: Orthopedic Surgery

## 2023-03-30 LAB — URINE CULTURE: Culture: NO GROWTH

## 2023-03-30 NOTE — Telephone Encounter (Signed)
Octavia Heir, NP  You1 hour ago (9:21 AM)    Letter printed at nurses station.     Donald Notified and will Pick up. Left up front in drawer.

## 2023-03-30 NOTE — Telephone Encounter (Signed)
Stephanie Acevedo called and stated that patient needs a letter from her Dr. Carney Bern [facility] will accept patient back to the facility after hospital stay. Stated that they need this ASAP sent to [facility].

## 2023-04-02 LAB — CULTURE, BLOOD (ROUTINE X 2)
Culture: NO GROWTH
Culture: NO GROWTH
Special Requests: ADEQUATE
Special Requests: ADEQUATE

## 2023-04-05 ENCOUNTER — Ambulatory Visit: Payer: Medicare Other | Admitting: Orthopedic Surgery

## 2023-04-08 ENCOUNTER — Encounter: Payer: Self-pay | Admitting: Orthopedic Surgery

## 2023-04-08 ENCOUNTER — Ambulatory Visit (INDEPENDENT_AMBULATORY_CARE_PROVIDER_SITE_OTHER): Payer: Medicare Other | Admitting: Orthopedic Surgery

## 2023-04-08 VITALS — BP 100/70 | HR 64 | Temp 96.9°F | Resp 14 | Ht 65.0 in | Wt 110.8 lb

## 2023-04-08 DIAGNOSIS — E1169 Type 2 diabetes mellitus with other specified complication: Secondary | ICD-10-CM

## 2023-04-08 DIAGNOSIS — R21 Rash and other nonspecific skin eruption: Secondary | ICD-10-CM | POA: Diagnosis not present

## 2023-04-08 DIAGNOSIS — E785 Hyperlipidemia, unspecified: Secondary | ICD-10-CM

## 2023-04-08 DIAGNOSIS — E119 Type 2 diabetes mellitus without complications: Secondary | ICD-10-CM | POA: Diagnosis not present

## 2023-04-08 DIAGNOSIS — R112 Nausea with vomiting, unspecified: Secondary | ICD-10-CM | POA: Diagnosis not present

## 2023-04-08 MED ORDER — PREDNISONE 20 MG PO TABS
20.0000 mg | ORAL_TABLET | Freq: Every day | ORAL | 0 refills | Status: AC
Start: 2023-04-08 — End: 2023-04-13

## 2023-04-08 NOTE — Progress Notes (Signed)
Careteam: Patient Care Team: Octavia Heir, NP as PCP - General (Adult Health Nurse Practitioner)  Seen by: Hazle Nordmann, AGNP-C  PLACE OF SERVICE:  [facility]  Advanced Directive information Does Patient Have a Medical Advance Directive?: Yes, Type of Advance Directive: Healthcare Power of [place];Living will;Out of facility DNR (pink MOST or yellow form), Does patient want to make changes to medical advance directive?: No - Patient declined  Allergies  Allergen Reactions   Aricept [Donepezil Hcl] Diarrhea    Chief Complaint  Patient presents with   Transitions Of Care    [facility] 03/28/2023-03/29/2023 for Vomiting. Patient has had some diarrhea since hospital visit and rash on back/stomach.      HPI: Patient is a 77 y.o. female seen today for f/u s/p hospitalization 07/14-07/15.   Husband present during encounter.   07/14 she had intractable N/V, 12 episodes, no blood. Husband took her to [facility] for evaluation. Ct abdomen negative for acute intra-abdominal pathology. No leukocytosis, dehydration, electrolyte abnormality. UA concerning for UTI. She was given IV ceftriaxone. A1c was 8.7, she was given SSI and discharged with metformin. She was discharged after tolerating foods. Discharged with Keflex and Zofran.   Poor historian due to dementia. Finished antibiotics last week. She is back to eating meals like normal. No fever, abdominal pain, N/V. Some diarrhea since starting metformin. She is having trouble swallowing pills and sometimes chew them.   Rash to back and abdomen. Began a few days ago. She has not been observed itching. Husband has changed detergents recently.    Review of Systems:  Review of Systems  Unable to perform ROS: Dementia    Past Medical History:  Diagnosis Date   Dehydration    Dementia (HCC)    Diabetes mellitus without complication (HCC)    Hyperlipidemia    Hypokalemia    Hypomagnesemia 08/05/2020   Pt was started on Aricept and developed severe  diarrhea, dehydration and hypokalemia that put her in NSVT and Torsades.  Med was stopped and electrolytes repleted with no further arrythmias.   Torsades de pointes (HCC)    Ventricular tachycardia, non-sustained (HCC)    History reviewed. No pertinent surgical history. Social History:   reports that she has never smoked. She has never used smokeless tobacco. She reports that she does not drink alcohol and does not use drugs.  Family History  Problem Relation Age of Onset   Dementia Mother 31    Medications: Patient's Medications  New Prescriptions   No medications on file  Previous Medications   APOAEQUORIN (PREVAGEN PO)    Take 1 capsule by mouth daily.   METFORMIN (GLUCOPHAGE-XR) 500 MG 24 HR TABLET    Take 1 tablet (500 mg total) by mouth daily with breakfast.   MULTIPLE VITAMIN (MULTIVITAMIN WITH MINERALS) TABS TABLET    Take 1 tablet by mouth daily.   ONDANSETRON (ZOFRAN) 4 MG TABLET    Take 1 tablet (4 mg total) by mouth every 8 (eight) hours as needed for nausea or vomiting.   ROSUVASTATIN (CRESTOR) 20 MG TABLET    TAKE 1 TABLET BY MOUTH EVERY DAY  Modified Medications   No medications on file  Discontinued Medications   No medications on file    Physical Exam:  Vitals:   04/08/23 1506  BP: 100/70  Pulse: 64  Resp: 14  Temp: (!) 96.9 F (36.1 C)  SpO2: 94%  Weight: 110 lb 12.8 oz (50.3 kg)  Height: 5\' 5"  (1.651 m)  Body mass index is 18.44 kg/m. Wt Readings from Last 3 Encounters:  04/08/23 110 lb 12.8 oz (50.3 kg)  03/28/23 113 lb (51.3 kg)  02/25/23 111 lb (50.3 kg)    Physical Exam Vitals reviewed.  Constitutional:      General: She is not in acute distress.    Appearance: She is underweight.  HENT:     Head: Normocephalic.  Eyes:     General:        Right eye: No discharge.        Left eye: No discharge.  Cardiovascular:     Rate and Rhythm: Normal rate and regular rhythm.     Pulses: Normal pulses.     Heart sounds: Normal heart sounds.   Pulmonary:     Effort: Pulmonary effort is normal. No respiratory distress.     Breath sounds: Normal breath sounds. No wheezing.  Abdominal:     General: Bowel sounds are normal. There is no distension.     Palpations: Abdomen is soft.     Tenderness: There is no abdominal tenderness. There is no guarding.  Musculoskeletal:     Cervical back: Neck supple.     Right lower leg: No edema.     Left lower leg: No edema.  Skin:    General: Skin is warm.     Capillary Refill: Capillary refill takes less than 2 seconds.     Comments: Scattered wheals to back and stomach, no scratch marks, appears to be subsiding  Neurological:     General: No focal deficit present.     Mental Status: She is alert. Mental status is at baseline.  Psychiatric:        Mood and Affect: Mood normal.     Labs reviewed: Basic Metabolic Panel: Recent Labs    02/25/23 0858 02/25/23 0858 03/28/23 1445 03/28/23 1451 03/28/23 1626 03/29/23 0404  NA 139  --  136 137  --  139  K 4.5  --  4.3 4.5  --  3.7  CL 105  --  102 105  --  107  CO2 29  --  22  --   --  25  GLUCOSE 206*  --  319* 316*  --  162*  BUN 20  --  29* 31*  --  16  CREATININE 0.74   < > 0.81 0.70 0.79 0.80  CALCIUM 9.7  --  9.3  --   --  8.9   < > = values in this interval not displayed.   Liver Function Tests: Recent Labs    02/25/23 0858 03/28/23 1445 03/29/23 0404  AST 14 23 22   ALT 13 26 20   ALKPHOS  --  59 45  BILITOT 0.4 0.4 0.6  PROT 7.3 7.1 6.2*  ALBUMIN  --  3.8 3.2*   No results for input(s): "LIPASE", "AMYLASE" in the last 8760 hours. No results for input(s): "AMMONIA" in the last 8760 hours. CBC: Recent Labs    02/25/23 0858 03/28/23 1445 03/28/23 1451 03/29/23 0404  WBC 7.6 18.6*  --  13.7*  NEUTROABS 5,533 17.0*  --   --   HGB 12.8 11.6* 11.6* 10.3*  HCT 39.1 35.4* 34.0* 32.0*  MCV 90.1 92.9  --  91.4  PLT 273 232  --  206   Lipid Panel: Recent Labs    02/25/23 0858  CHOL 169  HDL 89  LDLCALC 64   TRIG 78  CHOLHDL 1.9   TSH: No results for input(s): "  TSH" in the last 8760 hours. A1C: Lab Results  Component Value Date   HGBA1C 8.7 (H) 02/25/2023     Assessment/Plan 1. Intractable nausea and vomiting - [facility] 07/14-07/15> 12 episodes vomiting, no blood - CT abdomen unremarkable - no leukocytosis, dehydration or electrolyte imbalance - no abdominal pain, N/V, fever - tolerating food  - Basic Metabolic Panel with eGFR> BUN/creat 19/0.89 04/08/2023  2. Controlled type 2 diabetes mellitus without complication, without long-term current use of insulin (HCC) - A1c 8.7 - started on metformin - some diarrhea> ? metformin - husband reports trouble swallowing pills - advised to try 1/2 tablet metformin ( 250 mg) if swallowing pills is difficult  3. Hyperlipidemia associated with type 2 diabetes mellitus (HCC) - cont rosuvastatin  4. Skin rash - back and stomach involved - no observed scratching or scratch marks - ? Keflex - husband reports changing detergents - predniSONE (DELTASONE) 20 MG tablet; Take 1 tablet (20 mg total) by mouth daily with breakfast for 5 days.  Dispense: 5 tablet; Refill: 0  Total time: 31 minutes. Greater than 50% of total time spent doing patient education regarding health maintenance, N/V, T2DM, and rash including symptom/medication management.   Next appt: 09/02/2023  Hazle Nordmann, Juel Burrow  [facility] & Adult [facility] [phone]

## 2023-04-09 DIAGNOSIS — G309 Alzheimer's disease, unspecified: Secondary | ICD-10-CM | POA: Diagnosis not present

## 2023-05-19 DIAGNOSIS — K080 Exfoliation of teeth due to systemic causes: Secondary | ICD-10-CM | POA: Diagnosis not present

## 2023-07-01 DIAGNOSIS — D225 Melanocytic nevi of trunk: Secondary | ICD-10-CM | POA: Diagnosis not present

## 2023-07-01 DIAGNOSIS — D2262 Melanocytic nevi of left upper limb, including shoulder: Secondary | ICD-10-CM | POA: Diagnosis not present

## 2023-07-01 DIAGNOSIS — D2272 Melanocytic nevi of left lower limb, including hip: Secondary | ICD-10-CM | POA: Diagnosis not present

## 2023-07-01 DIAGNOSIS — D2261 Melanocytic nevi of right upper limb, including shoulder: Secondary | ICD-10-CM | POA: Diagnosis not present

## 2023-07-26 ENCOUNTER — Telehealth: Payer: Self-pay | Admitting: Orthopedic Surgery

## 2023-07-26 NOTE — Telephone Encounter (Signed)
Received paperwork. [facility]. Paperwork to be completed by 08/21/2023  Placed in Hazle Nordmann, NP folder to review, fill out and sign. Charge sheet attached.   [employer] [employer] Fax:[phone]

## 2023-07-26 NOTE — Telephone Encounter (Signed)
Patient husband dropped off forms for [facility]  ([employer] [employer]) given to [employer].

## 2023-08-01 ENCOUNTER — Other Ambulatory Visit: Payer: Self-pay | Admitting: Orthopedic Surgery

## 2023-08-01 DIAGNOSIS — E1169 Type 2 diabetes mellitus with other specified complication: Secondary | ICD-10-CM

## 2023-08-27 ENCOUNTER — Other Ambulatory Visit: Payer: Self-pay | Admitting: Orthopedic Surgery

## 2023-08-27 DIAGNOSIS — E1142 Type 2 diabetes mellitus with diabetic polyneuropathy: Secondary | ICD-10-CM

## 2023-09-02 ENCOUNTER — Ambulatory Visit (INDEPENDENT_AMBULATORY_CARE_PROVIDER_SITE_OTHER): Payer: Medicare Other | Admitting: Orthopedic Surgery

## 2023-09-02 ENCOUNTER — Encounter: Payer: Self-pay | Admitting: Orthopedic Surgery

## 2023-09-02 VITALS — BP 114/80 | HR 72 | Temp 96.8°F | Resp 16 | Ht 65.0 in | Wt 112.4 lb

## 2023-09-02 DIAGNOSIS — E1142 Type 2 diabetes mellitus with diabetic polyneuropathy: Secondary | ICD-10-CM | POA: Diagnosis not present

## 2023-09-02 DIAGNOSIS — E1169 Type 2 diabetes mellitus with other specified complication: Secondary | ICD-10-CM

## 2023-09-02 DIAGNOSIS — E785 Hyperlipidemia, unspecified: Secondary | ICD-10-CM

## 2023-09-02 DIAGNOSIS — F0390 Unspecified dementia without behavioral disturbance: Secondary | ICD-10-CM | POA: Diagnosis not present

## 2023-09-02 DIAGNOSIS — G4700 Insomnia, unspecified: Secondary | ICD-10-CM

## 2023-09-02 MED ORDER — TRAZODONE HCL 50 MG PO TABS
25.0000 mg | ORAL_TABLET | Freq: Every evening | ORAL | 3 refills | Status: DC | PRN
Start: 2023-09-02 — End: 2023-12-23

## 2023-09-02 MED ORDER — HYDROXYZINE PAMOATE 25 MG PO CAPS: 25.0000 mg | ORAL_CAPSULE | Freq: Three times a day (TID) | ORAL | 0 refills | Status: DC | PRN

## 2023-09-02 NOTE — Progress Notes (Signed)
Careteam: Patient Care Team: Stephanie Heir, NP as PCP - General (Adult Health Nurse Practitioner)  Seen by: Hazle Nordmann, AGNP-C  PLACE OF SERVICE:  [facility]  Advanced Directive information    Allergies  Allergen Reactions   Aricept [Donepezil Hcl] Diarrhea    Chief Complaint  Patient presents with   Medical Management of Chronic Issues    6 month follow up.    Health Maintenance    Discuss the need for Eye exam, Hemoglobin A1C, and Dexa scan.   Immunizations    Discuss the need for [employer].      HPI: Patient is a 77 y.o. female seen today for medical management of chronic conditions.   Husband present during encounter.   She is [facility] [employer] [facility]. She will go to bed at 8 pm and wake up at 3 pm and pace or try to organize. Husband tried 5-10 mg of melatonin without success. Mainly nonverbal. No aggressive behaviors. Weight stable.   Checking blood sugars > 189 today. She is taking metformin daily as prescribed. No hypoglycemia. Following low carb diet at [employer]. Scheduled to have eye exam soon.   Not interested in bone density. Unsure if she could tolerate study.       Review of Systems:  Review of Systems  Unable to perform ROS: Dementia    Past Medical History:  Diagnosis Date   Dehydration    Dementia (HCC)    Diabetes mellitus without complication (HCC)    Hyperlipidemia    Hypokalemia    Hypomagnesemia 08/05/2020   Pt was started on Aricept and developed severe diarrhea, dehydration and hypokalemia that put her in NSVT and Torsades.  Med was stopped and electrolytes repleted with no further arrythmias.   Torsades de pointes (HCC)    Ventricular tachycardia, non-sustained (HCC)    History reviewed. No pertinent surgical history. Social History:   reports that she has never smoked. She has never used smokeless tobacco. She reports that she does not drink alcohol and does not use drugs.  Family History   Problem Relation Age of Onset   Dementia Mother 49    Medications: Patient's Medications  New Prescriptions   No medications on file  Previous Medications   APOAEQUORIN (PREVAGEN PO)    Take 1 capsule by mouth daily.   METFORMIN (GLUCOPHAGE-XR) 500 MG 24 HR TABLET    TAKE 1 TABLET BY MOUTH EVERY DAY WITH BREAKFAST   MULTIPLE VITAMIN (MULTIVITAMIN WITH MINERALS) TABS TABLET    Take 1 tablet by mouth daily.   ONDANSETRON (ZOFRAN) 4 MG TABLET    Take 1 tablet (4 mg total) by mouth every 8 (eight) hours as needed for nausea or vomiting.   ROSUVASTATIN (CRESTOR) 20 MG TABLET    TAKE 1 TABLET BY MOUTH EVERY DAY  Modified Medications   No medications on file  Discontinued Medications   No medications on file    Physical Exam:  Vitals:   09/02/23 0827  BP: 114/80  Pulse: 72  Resp: 16  Temp: (!) 96.8 F (36 C)  SpO2: 96%  Weight: 112 lb 6.4 oz (51 kg)  Height: 5\' 5"  (1.651 m)   Body mass index is 18.7 kg/m. Wt Readings from Last 3 Encounters:  09/02/23 112 lb 6.4 oz (51 kg)  04/08/23 110 lb 12.8 oz (50.3 kg)  03/28/23 113 lb (51.3 kg)    Physical Exam Vitals reviewed.  Constitutional:      General:  She is not in acute distress. HENT:     Head: Normocephalic.  Eyes:     General:        Right eye: No discharge.        Left eye: No discharge.  Neck:     Thyroid: No thyroid mass or thyromegaly.  Cardiovascular:     Rate and Rhythm: Normal rate and regular rhythm.     Pulses: Normal pulses.     Heart sounds: Normal heart sounds.  Pulmonary:     Effort: Pulmonary effort is normal.     Breath sounds: Normal breath sounds.  Abdominal:     General: Bowel sounds are normal.     Palpations: Abdomen is soft.  Musculoskeletal:     Cervical back: Neck supple.     Right lower leg: No edema.     Left lower leg: No edema.  Skin:    General: Skin is warm.  Neurological:     General: No focal deficit present.     Mental Status: She is alert. Mental status is at baseline.      Motor: No weakness.     Gait: Gait normal.  Psychiatric:        Mood and Affect: Mood normal.     Comments: Nonverbal, alert to self/familiar face     Labs reviewed: Basic Metabolic Panel: Recent Labs    03/28/23 1445 03/28/23 1451 03/28/23 1626 03/29/23 0404 04/08/23 1544  NA 136 137  --  139 139  K 4.3 4.5  --  3.7 4.4  CL 102 105  --  107 103  CO2 22  --   --  25 30  GLUCOSE 319* 316*  --  162* 131*  BUN 29* 31*  --  16 19  CREATININE 0.81 0.70 0.79 0.80 0.89  CALCIUM 9.3  --   --  8.9 9.8   Liver Function Tests: Recent Labs    02/25/23 0858 03/28/23 1445 03/29/23 0404  AST 14 23 22   ALT 13 26 20   ALKPHOS  --  59 45  BILITOT 0.4 0.4 0.6  PROT 7.3 7.1 6.2*  ALBUMIN  --  3.8 3.2*   No results for input(s): "LIPASE", "AMYLASE" in the last 8760 hours. No results for input(s): "AMMONIA" in the last 8760 hours. CBC: Recent Labs    02/25/23 0858 03/28/23 1445 03/28/23 1451 03/29/23 0404  WBC 7.6 18.6*  --  13.7*  NEUTROABS 5,533 17.0*  --   --   HGB 12.8 11.6* 11.6* 10.3*  HCT 39.1 35.4* 34.0* 32.0*  MCV 90.1 92.9  --  91.4  PLT 273 232  --  206   Lipid Panel: Recent Labs    02/25/23 0858  CHOL 169  HDL 89  LDLCALC 64  TRIG 78  CHOLHDL 1.9   TSH: No results for input(s): "TSH" in the last 8760 hours. A1C: Lab Results  Component Value Date   HGBA1C 8.7 (H) 02/25/2023     Assessment/Plan 1. Controlled type 2 diabetes mellitus with diabetic polyneuropathy, without long-term current use of insulin (HCC) (Primary) - A1c 8.7 (02/2023) - sugars 180-200's at home - no hypoglycemia - scheduled eye exam soon - cont metformin  - Complete Metabolic Panel with eGFR - Hemoglobin A1c  2. Dementia without behavioral disturbance (HCC) - doing well at [facility] 5x[facility]week - sleeping from 8 pm to 3 am> unsuccessful trial melatonin - ambulates well without device - weight stable - trial trazodone for sleep - recommend having  hydroxyzine  if any behaviors arise - CBC with Differential/Platelet - hydrOXYzine (VISTARIL) 25 MG capsule; Take 1 capsule (25 mg total) by mouth every 8 (eight) hours as needed.  Dispense: 30 capsule; Refill: 0  3. Hyperlipidemia associated with type 2 diabetes mellitus (HCC) - total 169, LDL 64 02/25/2023 - cont rosuvastatin - Lipid Panel  4. Insomnia, unspecified type - associated with dementia  - see above - traZODone (DESYREL) 50 MG tablet; Take 0.5-1 tablets (25-50 mg total) by mouth at bedtime as needed for sleep.  Dispense: 30 tablet; Refill: 3  Total time: 31 minutes. Greater than 50% of total time spent doing patient education regarding health maintenance, T2DM, dementia and sleep including symptom/medication management.    Next appt: Visit date not found  Torez Beauregard Scherry Ran  [facility] Adult Medicine [phone]

## 2023-09-02 NOTE — Patient Instructions (Addendum)
Trazodone for sleep  Vistaril for behaviors> may cause drowsiness  Recommend vitamin D> 2000 units- give 1 capsule daily

## 2023-09-03 ENCOUNTER — Other Ambulatory Visit: Payer: Self-pay | Admitting: Orthopedic Surgery

## 2023-09-03 DIAGNOSIS — E1142 Type 2 diabetes mellitus with diabetic polyneuropathy: Secondary | ICD-10-CM

## 2023-09-03 LAB — LIPID PANEL
Cholesterol: 173 mg/dL (ref <200)
HDL: 100 mg/dL (ref >=50)
LDL Cholesterol (Calc): 60 mg/dL
Non-HDL Cholesterol (Calc): 73 mg/dL (ref <130)
Total CHOL/HDL Ratio: 1.7 (calc) (ref <5.0)
Triglycerides: 53 mg/dL (ref <150)

## 2023-09-03 LAB — COMPLETE METABOLIC PANEL WITH GFR
AG Ratio: 1.4 (calc) (ref 1.0–2.5)
ALT: 23 U/L (ref 6–29)
AST: 22 U/L (ref 10–35)
Albumin: 4.2 g/dL (ref 3.6–5.1)
Alkaline phosphatase (APISO): 61 U/L (ref 37–153)
BUN: 20 mg/dL (ref 7–25)
CO2: 29 mmol/L (ref 20–32)
Calcium: 9.5 mg/dL (ref 8.6–10.4)
Chloride: 100 mmol/L (ref 98–110)
Creat: 0.73 mg/dL (ref 0.60–1.00)
Globulin: 2.9 g/dL (ref 1.9–3.7)
Glucose, Bld: 170 mg/dL — ABNORMAL HIGH (ref 65–99)
Potassium: 4.3 mmol/L (ref 3.5–5.3)
Sodium: 137 mmol/L (ref 135–146)
Total Bilirubin: 0.5 mg/dL (ref 0.2–1.2)
Total Protein: 7.1 g/dL (ref 6.1–8.1)
eGFR: 85 mL/min/{1.73_m2} (ref >=60)

## 2023-09-03 LAB — CBC WITH DIFFERENTIAL/PLATELET
Absolute Lymphocytes: 1454 {cells}/uL (ref 850–3900)
Absolute Monocytes: 514 {cells}/uL (ref 200–950)
Basophils Absolute: 47 {cells}/uL (ref 0–200)
Basophils Relative: 0.6 %
Eosinophils Absolute: 24 {cells}/uL (ref 15–500)
Eosinophils Relative: 0.3 %
HCT: 34.4 % — ABNORMAL LOW (ref 35.0–45.0)
Hemoglobin: 11.4 g/dL — ABNORMAL LOW (ref 11.7–15.5)
MCH: 29.1 pg (ref 27.0–33.0)
MCHC: 33.1 g/dL (ref 32.0–36.0)
MCV: 87.8 fL (ref 80.0–100.0)
MPV: 9.7 fL (ref 7.5–12.5)
Monocytes Relative: 6.5 %
Neutro Abs: 5862 {cells}/uL (ref 1500–7800)
Neutrophils Relative %: 74.2 %
Platelets: 304 10*3/uL (ref 140–400)
RBC: 3.92 10*6/uL (ref 3.80–5.10)
RDW: 12.5 % (ref 11.0–15.0)
Total Lymphocyte: 18.4 %
WBC: 7.9 10*3/uL (ref 3.8–10.8)

## 2023-09-03 LAB — HEMOGLOBIN A1C
Hgb A1c MFr Bld: 8.5 %{Hb} — ABNORMAL HIGH (ref <5.7)
Mean Plasma Glucose: 197 mg/dL
eAG (mmol/L): 10.9 mmol/L

## 2023-09-03 MED ORDER — METFORMIN HCL 1000 MG PO TABS: 1000.0000 mg | ORAL_TABLET | Freq: Every day | ORAL | 2 refills | Status: DC

## 2023-09-05 ENCOUNTER — Encounter: Payer: Self-pay | Admitting: Orthopedic Surgery

## 2023-09-24 ENCOUNTER — Other Ambulatory Visit: Payer: Self-pay | Admitting: Orthopedic Surgery

## 2023-09-24 DIAGNOSIS — G4700 Insomnia, unspecified: Secondary | ICD-10-CM

## 2023-11-16 ENCOUNTER — Other Ambulatory Visit: Payer: Medicare Other

## 2023-12-10 ENCOUNTER — Ambulatory Visit: Payer: Medicare Other | Admitting: Nurse Practitioner

## 2023-12-10 NOTE — Progress Notes (Deleted)
 Subjective:   Stephanie Acevedo is a 78 y.o. female who presents for Medicare Annual (Subsequent) preventive examination.  Visit Complete: In person          Objective:    There were no vitals filed for this visit. There is no height or weight on file to calculate BMI.     09/02/2023    8:33 AM 04/08/2023    3:09 PM 03/28/2023    1:28 PM 02/25/2023    8:29 AM 12/07/2022   10:14 AM 12/07/2022    9:47 AM 02/19/2022    8:32 AM  Advanced Directives  Does Patient Have a Medical Advance Directive? Yes Yes Yes Yes Yes Yes Yes  Type of Estate agent of [place];Living will;Out of facility DNR (pink MOST or yellow form) Healthcare Power of [place];Living will;Out of facility DNR (pink MOST or yellow form) Living will;Healthcare Power of [employer] of [place];Living will;Out of facility DNR (pink MOST or yellow form) Healthcare Power of [place];Living will;Out of facility DNR (pink MOST or yellow form) Healthcare Power of [place];Living will;Out of facility DNR (pink MOST or yellow form) Healthcare Power of [place];Out of facility DNR (pink MOST or yellow form)  Does patient want to make changes to medical advance directive? No - Patient declined No - Patient declined  No - Patient declined No - Patient declined No - Patient declined No - Patient declined  Copy of Healthcare Power of Attorney in Chart? Yes - validated most recent copy scanned in chart (See row information) Yes - validated most recent copy scanned in chart (See row information)  Yes - validated most recent copy scanned in chart (See row information) Yes - validated most recent copy scanned in chart (See row information) Yes - validated most recent copy scanned in chart (See row information) Yes - validated most recent copy scanned in chart (See row information)  Pre-existing out of facility DNR order (yellow form or pink MOST form)     Yellow form placed in chart (order not valid for inpatient  use);Pink MOST form placed in chart (order not valid for inpatient use) Yellow form placed in chart (order not valid for inpatient use);Pink MOST form placed in chart (order not valid for inpatient use) Yellow form placed in chart (order not valid for inpatient use)    Current Medications (verified) Outpatient Encounter Medications as of 12/10/2023  Medication Sig   metFORMIN (GLUCOPHAGE) 1000 MG tablet Take 1 tablet (1,000 mg total) by mouth daily with breakfast.   Apoaequorin (PREVAGEN PO) Take 1 capsule by mouth daily.   hydrOXYzine (VISTARIL) 25 MG capsule Take 1 capsule (25 mg total) by mouth every 8 (eight) hours as needed.   Multiple Vitamin (MULTIVITAMIN WITH MINERALS) TABS tablet Take 1 tablet by mouth daily.   ondansetron (ZOFRAN) 4 MG tablet Take 1 tablet (4 mg total) by mouth every 8 (eight) hours as needed for nausea or vomiting.   rosuvastatin (CRESTOR) 20 MG tablet TAKE 1 TABLET BY MOUTH EVERY DAY   traZODone (DESYREL) 50 MG tablet Take 0.5-1 tablets (25-50 mg total) by mouth at bedtime as needed for sleep.   No facility-administered encounter medications on file as of 12/10/2023.    Allergies (verified) Aricept [donepezil hcl]   History: Past Medical History:  Diagnosis Date   Dehydration    Dementia (HCC)    Diabetes mellitus without complication (HCC)    Hyperlipidemia    Hypokalemia    Hypomagnesemia 08/05/2020   Pt was started on  Aricept and developed severe diarrhea, dehydration and hypokalemia that put her in NSVT and Torsades.  Med was stopped and electrolytes repleted with no further arrythmias.   Torsades de pointes (HCC)    Ventricular tachycardia, non-sustained (HCC)    No past surgical history on file. Family History  Problem Relation Age of Onset   Dementia Mother 87   Social History   Socioeconomic History   Marital status: Married    Spouse name: Dorinda Hill    Number of children: 1   Years of education: 16   Highest education level: Bachelor's  degree (e.g., BA, AB, BS)  Occupational History   Not on file  Tobacco Use   Smoking status: Never   Smokeless tobacco: Never  Vaping Use   Vaping status: Never Used  Substance and Sexual Activity   Alcohol use: Never   Drug use: Never   Sexual activity: Not Currently  Other Topics Concern   Not on file  Social History Narrative   Diet:       Do you drink/ eat things with caffeine?  Yes      Marital status:  Married                             What year were you married ?  1970      Do you live in a house, apartment,assistred living, condo, trailer, etc.)?  House      Is it one or more stories?  No      How many persons live in your home ?  2      Do you have any pets in your home ?(please list) None      Highest Level of education completed: College BS      Current or past profession: Aeronautical engineer      Do you exercise?   Yes                           Type & how often  Walking, Daily      ADVANCED DIRECTIVES (Please bring copies)      Do you have a living will? No      Do you have a DNR form?                       If not, do you want to discuss one? Yes      Do you have signed POA?HPOA forms?  Yes               If so, please bring to your appointment      FUNCTIONAL STATUS- To be completed by Spouse / child / Staff       Do you have difficulty bathing or dressing yourself ? No      Do you have difficulty preparing food or eating ?  No      Do you have difficulty managing your mediation ?No      Do you have difficulty managing your finances ? Yes      Do you have difficulty affording your medication ? No      Social Drivers of Corporate investment banker Strain: Not on file  Food Insecurity: Not on file  Transportation Needs: Not on file  Physical Activity: Not on file  Stress: Not on file  Social Connections: Not on file    Tobacco Counseling  Counseling given: Not Answered   Clinical Intake:                         Activities of Daily Living     No data to display          Patient Care Team: Octavia Heir, NP as PCP - General (Adult Health Nurse Practitioner) [facility] [place]  Indicate any recent Medical Services you may have received from other than [facility] providers in the past year (date may be approximate).     Assessment:   This is a routine wellness examination for [place].  Hearing/Vision screen No results found.   Goals Addressed   None    Depression Screen    02/25/2023    9:05 AM 12/07/2022   10:15 AM 08/27/2022    8:17 AM 02/19/2022    8:33 AM 12/04/2021    3:14 PM 11/28/2020   11:14 AM 11/21/2020    7:59 AM  PHQ 2/9 Scores  PHQ - 2 Score  0 0 0 0 0 0  Exception Documentation Medical reason          Fall Risk    09/02/2023    8:32 AM 04/08/2023    3:08 PM 02/25/2023    8:29 AM 12/07/2022   10:15 AM 08/27/2022    8:17 AM  Fall Risk   Falls in the past year? 0 0 0 1 0  Number falls in past yr: 0 0 0 0 0  Injury with Fall? 0 0 0 0 0  Risk for fall due to : No Fall Risks No Fall Risks No Fall Risks Impaired balance/gait No Fall Risks  Follow up Falls evaluation completed;Education provided;Falls prevention discussed Falls evaluation completed;Education provided;Falls prevention discussed Falls evaluation completed Falls prevention discussed;Education provided;Falls evaluation completed Falls evaluation completed    MEDICARE RISK AT HOME:    TIMED UP AND GO:  Was the test performed?  No    Cognitive Function:    11/27/2019    1:58 PM  MMSE - Mini Mental State Exam  Orientation to time 1  Orientation to Place 2.5  Registration 1  Attention/ Calculation 2.5  Recall 1  Language- name 2 objects 2  Language- repeat 0  Language- follow 3 step command 1  Language- read & follow direction 0  Write a sentence 0  Copy design 0  Total score 11        12/07/2022   10:17 AM 11/28/2020   11:17 AM  6CIT Screen  What Year? 4 points 4 points  What  month? 3 points 3 points  What time? 3 points 0 points  Count back from 20 4 points 4 points  Months in reverse 4 points 4 points  Repeat phrase 10 points 10 points  Total Score 28 points 25 points    Immunizations Immunization History  Administered Date(s) Administered   Fluad Quad(high Dose 65+) 06/10/2022   Influenza, High Dose Seasonal PF 07/04/2023   PFIZER Comirnaty(Gray Top)Covid-19 Tri-Sucrose Vaccine 12/26/2020   PFIZER(Purple Top)SARS-COV-2 Vaccination 01/18/2020, 02/13/2020, 08/31/2020   PNEUMOCOCCAL CONJUGATE-20 02/25/2023   Pfizer Covid-19 Vaccine Bivalent Booster 62yrs & up 07/01/2021   Pneumococcal Conjugate-13 04/04/2020   Td 01/15/2023   Tdap 01/15/2023   Unspecified SARS-COV-2 Vaccination 12/04/2022, 04/24/2023   Zoster Recombinant(Shingrix) 01/23/2023, 04/10/2023    TDAP status: Up to date  Flu Vaccine status: Declined, Education has been provided regarding the importance of this vaccine but patient still  declined. Advised may receive this vaccine at local pharmacy or Health Dept. Aware to provide a copy of the vaccination record if obtained from local pharmacy or Health Dept. Verbalized acceptance and understanding.  Pneumococcal vaccine status: Up to date  Covid-19 vaccine status: Information provided on how to obtain vaccines.   Qualifies for Shingles Vaccine? Yes   Zostavax completed No   Shingrix Completed?: Yes  Screening Tests Health Maintenance  Topic Date Due   OPHTHALMOLOGY EXAM  03/27/2023   DEXA SCAN  09/01/2024 (Originally 06/04/2012)   COVID-19 Vaccine (8 - 2024-25 season) 09/14/2024 (Originally 06/19/2023)   FOOT EXAM  02/25/2024   HEMOGLOBIN A1C  03/02/2024   DTaP/Tdap/Td (3 - Td or Tdap) 01/14/2033   Pneumonia Vaccine 3+ Years old  Completed   Hepatitis C Screening  Completed   Zoster Vaccines- Shingrix  Completed   HPV VACCINES  Aged Out   INFLUENZA VACCINE  Discontinued    Health Maintenance  Health Maintenance Due  Topic  Date Due   OPHTHALMOLOGY EXAM  03/27/2023    Colorectal cancer screening: No longer required.   Mammogram status: No longer required due to age.  {Bone Density status:21018021}  Lung Cancer Screening: (Low Dose CT Chest recommended if Age 7-80 years, 20 pack-year currently smoking OR have quit w/in 15years.) does not qualify.   Lung Cancer Screening Referral: na  Additional Screening:  Hepatitis C Screening: does qualify; Completed   Vision Screening: Recommended annual ophthalmology exams for early detection of glaucoma and other disorders of the eye. Is the patient up to date with their annual eye exam?  Yes  Who is the provider or what is the name of the office in which the patient attends annual eye exams? [place] If pt is not established with a provider, would they like to be referred to a provider to establish care? No .   Dental Screening: Recommended annual dental exams for proper oral hygiene  Diabetic Foot Exam: Diabetic Foot Exam: Completed 02/25/2023  Community Resource Referral / Chronic Care Management: CRR required this visit?  No   CCM required this visit?  No     Plan:     I have personally reviewed and noted the following in the patient's chart:   Medical and social history Use of alcohol, tobacco or illicit drugs  Current medications and supplements including opioid prescriptions. Patient is not currently taking opioid prescriptions. Functional ability and status Nutritional status Physical activity Advanced directives List of other physicians Hospitalizations, surgeries, and ER visits in previous 12 months Vitals Screenings to include cognitive, depression, and falls Referrals and appointments  In addition, I have reviewed and discussed with patient certain preventive protocols, quality metrics, and best practice recommendations. A written personalized care plan for preventive services as well as general preventive health recommendations were  provided to patient.     Sharon Seller, NP   12/10/2023       This encounter was created in error - please disregard.

## 2023-12-10 NOTE — Progress Notes (Signed)
 Error

## 2023-12-15 DIAGNOSIS — K080 Exfoliation of teeth due to systemic causes: Secondary | ICD-10-CM | POA: Diagnosis not present

## 2023-12-23 ENCOUNTER — Encounter: Payer: Self-pay | Admitting: Orthopedic Surgery

## 2023-12-23 ENCOUNTER — Ambulatory Visit: Admitting: Orthopedic Surgery

## 2023-12-23 VITALS — BP 116/82 | HR 85 | Temp 96.1°F | Resp 16 | Ht 65.0 in | Wt 111.6 lb

## 2023-12-23 DIAGNOSIS — G4700 Insomnia, unspecified: Secondary | ICD-10-CM

## 2023-12-23 DIAGNOSIS — Z0000 Encounter for general adult medical examination without abnormal findings: Secondary | ICD-10-CM

## 2023-12-23 MED ORDER — HYDROXYZINE HCL 10 MG PO TABS: 10.0000 mg | ORAL_TABLET | Freq: Every evening | ORAL | 0 refills | Status: DC | PRN

## 2023-12-23 NOTE — Patient Instructions (Addendum)
  Ms. Wessells , Thank you for taking time to come for your Medicare Wellness Visit. I appreciate your ongoing commitment to your health goals. Please review the following plan we discussed and let me know if I can assist you in the future.   These are the goals we discussed:  Goals      Absence of Fall and Fall-Related Injury     Evidence-based guidance:  Assess fall risk using a validated tool when available. Consider balance and gait impairment, muscle weakness, diminished vision or hearing, environmental hazards, presence of urinary or bowel urgency and/or incontinence.  Communicate fall injury risk to interprofessional healthcare team.  Develop a fall prevention plan with the patient and family.  Promote use of personal vision and auditory aids.  Promote reorientation, appropriate sensory stimulation, and routines to decrease risk of fall when changes in mental status are present.  Assess assistance level required for safe and effective self-care; consider referral for home care.  Encourage physical activity, such as performance of self-care at highest level of ability, strength and balance exercise program, and provision of appropriate assistive devices; refer to rehabilitation therapy.  Refer to community-based fall prevention program where available.  If fall occurs, determine the cause and revise fall injury prevention plan.  Regularly review medication contribution to fall risk; consider risk related to polypharmacy and age.  Refer to pharmacist for consultation when concerns about medications are revealed.  Balance adequate pain management with potential for oversedation.  Provide guidance related to environmental modifications.  Consider supplementation with Vitamin D.   Notes:         This is a list of the screening recommended for you and due dates:  Health Maintenance  Topic Date Due   Eye exam for diabetics  03/27/2023   DEXA scan (bone density measurement)  09/01/2024*    COVID-19 Vaccine (8 - 2024-25 season) 09/14/2024*   Complete foot exam   02/25/2024   Hemoglobin A1C  03/02/2024   DTaP/Tdap/Td vaccine (3 - Td or Tdap) 01/14/2033   Pneumonia Vaccine  Completed   Hepatitis C Screening  Completed   Zoster (Shingles) Vaccine  Completed   HPV Vaccine  Aged Out   Meningitis B Vaccine  Aged Out   Flu Shot  Discontinued  *Topic was postponed. The date shown is not the original due date.   Continue walking for bone health. Consider hydroxyzine as sleeping aid if melatonin not working> watch out for drowsiness in AM. Try 10 mg first> if helpful> take the 25 mg

## 2023-12-23 NOTE — Progress Notes (Signed)
 Subjective:   Stephanie Acevedo is a 78 y.o. female who presents for Medicare Annual (Subsequent) preventive examination.  Visit Complete: In person  Patient Medicare AWV questionnaire was completed by the patient on 12/23/2023; I have confirmed that all information answered by patient is correct and no changes since this date.  Cardiac Risk Factors include: advanced age (>29men, >78 women);diabetes mellitus;dyslipidemia     Objective:    Today's Vitals   12/23/23 1452  BP: 116/82  Pulse: 85  Resp: 16  Temp: (!) 96.1 F (35.6 C)  SpO2: 96%  Weight: 111 lb 9.6 oz (50.6 kg)  Height: 5\' 5"  (1.651 m)   Body mass index is 18.57 kg/m.     12/23/2023    3:00 PM 09/02/2023    8:33 AM 04/08/2023    3:09 PM 03/28/2023    1:28 PM 02/25/2023    8:29 AM 12/07/2022   10:14 AM 12/07/2022    9:47 AM  Advanced Directives  Does Patient Have a Medical Advance Directive? Yes Yes Yes Yes Yes Yes Yes  Type of Estate agent of [place];Living will;Out of facility DNR (pink MOST or yellow form) Healthcare Power of [place];Living will;Out of facility DNR (pink MOST or yellow form) Healthcare Power of [place];Living will;Out of facility DNR (pink MOST or yellow form) Living will;Healthcare Power of [employer] of [place];Living will;Out of facility DNR (pink MOST or yellow form) Healthcare Power of [place];Living will;Out of facility DNR (pink MOST or yellow form) Healthcare Power of [place];Living will;Out of facility DNR (pink MOST or yellow form)  Does patient want to make changes to medical advance directive? No - Patient declined No - Patient declined No - Patient declined  No - Patient declined No - Patient declined No - Patient declined  Copy of Healthcare Power of Attorney in Chart? Yes - validated most recent copy scanned in chart (See row information) Yes - validated most recent copy scanned in chart (See row information) Yes - validated most recent copy  scanned in chart (See row information)  Yes - validated most recent copy scanned in chart (See row information) Yes - validated most recent copy scanned in chart (See row information) Yes - validated most recent copy scanned in chart (See row information)  Pre-existing out of facility DNR order (yellow form or pink MOST form)      Yellow form placed in chart (order not valid for inpatient use);Pink MOST form placed in chart (order not valid for inpatient use) Yellow form placed in chart (order not valid for inpatient use);Pink MOST form placed in chart (order not valid for inpatient use)    Current Medications (verified) Outpatient Encounter Medications as of 12/23/2023  Medication Sig   Apoaequorin (PREVAGEN PO) Take 1 capsule by mouth daily.   hydrOXYzine (VISTARIL) 25 MG capsule Take 1 capsule (25 mg total) by mouth every 8 (eight) hours as needed.   metFORMIN (GLUCOPHAGE) 1000 MG tablet Take 1 tablet (1,000 mg total) by mouth daily with breakfast.   Multiple Vitamin (MULTIVITAMIN WITH MINERALS) TABS tablet Take 1 tablet by mouth daily.   ondansetron (ZOFRAN) 4 MG tablet Take 1 tablet (4 mg total) by mouth every 8 (eight) hours as needed for nausea or vomiting.   rosuvastatin (CRESTOR) 20 MG tablet TAKE 1 TABLET BY MOUTH EVERY DAY   [DISCONTINUED] traZODone (DESYREL) 50 MG tablet Take 0.5-1 tablets (25-50 mg total) by mouth at bedtime as needed for sleep. (Patient not taking: Reported on 12/23/2023)  No facility-administered encounter medications on file as of 12/23/2023.    Allergies (verified) Aricept [donepezil hcl]   History: Past Medical History:  Diagnosis Date   Dehydration    Dementia (HCC)    Diabetes mellitus without complication (HCC)    Hyperlipidemia    Hypokalemia    Hypomagnesemia 08/05/2020   Pt was started on Aricept and developed severe diarrhea, dehydration and hypokalemia that put her in NSVT and Torsades.  Med was stopped and electrolytes repleted with no further  arrythmias.   Torsades de pointes (HCC)    Ventricular tachycardia, non-sustained (HCC)    No past surgical history on file. Family History  Problem Relation Age of Onset   Dementia Mother 33   Social History   Socioeconomic History   Marital status: Married    Spouse name: Dorinda Hill    Number of children: 1   Years of education: 16   Highest education level: Bachelor's degree (e.g., BA, AB, BS)  Occupational History   Not on file  Tobacco Use   Smoking status: Never   Smokeless tobacco: Never  Vaping Use   Vaping status: Never Used  Substance and Sexual Activity   Alcohol use: Never   Drug use: Never   Sexual activity: Not Currently  Other Topics Concern   Not on file  Social History Narrative   Diet:       Do you drink/ eat things with caffeine?  Yes      Marital status:  Married                             What year were you married ?  1970      Do you live in a house, apartment,assistred living, condo, trailer, etc.)?  House      Is it one or more stories?  No      How many persons live in your home ?  2      Do you have any pets in your home ?(please list) None      Highest Level of education completed: College BS      Current or past profession: Aeronautical engineer      Do you exercise?   Yes                           Type & how often  Walking, Daily      ADVANCED DIRECTIVES (Please bring copies)      Do you have a living will? No      Do you have a DNR form?                       If not, do you want to discuss one? Yes      Do you have signed POA?HPOA forms?  Yes               If so, please bring to your appointment      FUNCTIONAL STATUS- To be completed by Spouse / child / Staff       Do you have difficulty bathing or dressing yourself ? No      Do you have difficulty preparing food or eating ?  No      Do you have difficulty managing your mediation ?No      Do you have difficulty managing your finances ? Yes  Do you have difficulty  affording your medication ? No      Social Drivers of Corporate investment banker Strain: Low Risk  (12/23/2023)   Overall Financial Resource Strain (CARDIA)    Difficulty of Paying Living Expenses: Not hard at all  Food Insecurity: No Food Insecurity (12/23/2023)   Hunger Vital Sign    Worried About Running Out of Food in the Last Year: Never true    Ran Out of Food in the Last Year: Never true  Transportation Needs: No Transportation Needs (12/23/2023)   PRAPARE - Administrator, Civil Service (Medical): No    Lack of Transportation (Non-Medical): No  Physical Activity: Sufficiently Active (12/23/2023)   Exercise Vital Sign    Days of Exercise per Week: 7 days    Minutes of Exercise per Session: 30 min  Stress: No Stress Concern Present (12/23/2023)   [employer] of Occupational Health - Occupational Stress Questionnaire    Feeling of Stress : Not at all  Social Connections: Socially Isolated (12/23/2023)   Social Connection and Isolation Panel [NHANES]    Frequency of Communication with Friends and Family: Never    Frequency of Social Gatherings with Friends and Family: Never    Attends Religious Services: Never    Database administrator or Organizations: No    Attends Engineer, structural: Never    Marital Status: Married    Tobacco Counseling Counseling given: Not Answered   Clinical Intake:  Pre-visit preparation completed: No  Pain : No/denies pain     BMI - recorded: 18.57 Nutritional Status: BMI <19  Underweight Nutritional Risks: None Diabetes: Yes CBG done?: Yes (home sugars around 200 per husband) CBG resulted in Enter/ Edit results?: No Did pt. bring in CBG monitor from home?: No  How often do you need to have someone help you when you read instructions, pamphlets, or other written materials from your doctor or pharmacy?: 5 - Always What is the last grade level you completed in school?: College, [employer] degree  Interpreter  Needed?: No      Activities of Daily Living    12/23/2023    3:23 PM  In your present state of health, do you have any difficulty performing the following activities:  Hearing? 0  Vision? 0  Difficulty concentrating or making decisions? 1  Walking or climbing stairs? 1  Dressing or bathing? 1  Doing errands, shopping? 1  Preparing Food and eating ? Y  Using the Toilet? Y  In the past six months, have you accidently leaked urine? Y  Do you have problems with loss of bowel control? Y  Managing your Medications? Y  Managing your Finances? Y  Housekeeping or managing your Housekeeping? Y    Patient Care Team: Octavia Heir, NP as PCP - General (Adult Health Nurse Practitioner) [facility], [place]  Indicate any recent Medical Services you may have received from other than [facility] providers in the past year (date may be approximate).     Assessment:   This is a routine wellness examination for [place].  Hearing/Vision screen Hearing Screening - Comments:: No hearing concerns.  Vision Screening - Comments:: No vision concerns. Patient last eye exam April 2024. Patient doesn't wear prescription glasses.    Goals Addressed             This Visit's Progress    Absence of Fall and Fall-Related Injury   On track    Evidence-based guidance:  Assess fall risk using a validated tool when available. Consider balance and gait impairment, muscle weakness, diminished vision or hearing, environmental hazards, presence of urinary or bowel urgency and/or incontinence.  Communicate fall injury risk to interprofessional healthcare team.  Develop a fall prevention plan with the patient and family.  Promote use of personal vision and auditory aids.  Promote reorientation, appropriate sensory stimulation, and routines to decrease risk of fall when changes in mental status are present.  Assess assistance level required for safe and effective self-care; consider referral for home care.   Encourage physical activity, such as performance of self-care at highest level of ability, strength and balance exercise program, and provision of appropriate assistive devices; refer to rehabilitation therapy.  Refer to community-based fall prevention program where available.  If fall occurs, determine the cause and revise fall injury prevention plan.  Regularly review medication contribution to fall risk; consider risk related to polypharmacy and age.  Refer to pharmacist for consultation when concerns about medications are revealed.  Balance adequate pain management with potential for oversedation.  Provide guidance related to environmental modifications.  Consider supplementation with Vitamin D.   Notes:        Depression Screen    12/23/2023    3:25 PM 12/23/2023    2:59 PM 02/25/2023    9:05 AM 12/07/2022   10:15 AM 08/27/2022    8:17 AM 02/19/2022    8:33 AM 12/04/2021    3:14 PM  PHQ 2/9 Scores  PHQ - 2 Score  0  0 0 0 0  Exception Documentation Medical reason  Medical reason        Fall Risk    12/23/2023    2:59 PM 09/02/2023    8:32 AM 04/08/2023    3:08 PM 02/25/2023    8:29 AM 12/07/2022   10:15 AM  Fall Risk   Falls in the past year? 1 0 0 0 1  Number falls in past yr: 1 0 0 0 0  Injury with Fall? 1 0 0 0 0  Risk for fall due to : History of fall(s) No Fall Risks No Fall Risks No Fall Risks Impaired balance/gait  Follow up Falls evaluation completed Falls evaluation completed;Education provided;Falls prevention discussed Falls evaluation completed;Education provided;Falls prevention discussed Falls evaluation completed Falls prevention discussed;Education provided;Falls evaluation completed    MEDICARE RISK AT HOME: Medicare Risk at Home Any stairs in or around the home?: No If so, are there any without handrails?: No Home free of loose throw rugs in walkways, pet beds, electrical cords, etc?: Yes Adequate lighting in your home to reduce risk of falls?: Yes Life  alert?: No Use of a cane, walker or w/c?: Yes Grab bars in the bathroom?: Yes Shower chair or bench in shower?: Yes Elevated toilet seat or a handicapped toilet?: Yes  TIMED UP AND GO:  Was the test performed?  No    Cognitive Function:    11/27/2019    1:58 PM  MMSE - Mini Mental State Exam  Orientation to time 1  Orientation to Place 2.5  Registration 1  Attention/ Calculation 2.5  Recall 1  Language- name 2 objects 2  Language- repeat 0  Language- follow 3 step command 1  Language- read & follow direction 0  Write a sentence 0  Copy design 0  Total score 11        12/07/2022   10:17 AM 11/28/2020   11:17 AM  6CIT Screen  What Year? 4 points 4  points  What month? 3 points 3 points  What time? 3 points 0 points  Count back from 20 4 points 4 points  Months in reverse 4 points 4 points  Repeat phrase 10 points 10 points  Total Score 28 points 25 points    Immunizations Immunization History  Administered Date(s) Administered   Fluad Quad(high Dose 65+) 06/10/2022   Influenza, High Dose Seasonal PF 07/04/2023   PFIZER Comirnaty(Gray Top)Covid-19 Tri-Sucrose Vaccine 12/26/2020   PFIZER(Purple Top)SARS-COV-2 Vaccination 01/18/2020, 02/13/2020, 08/31/2020   PNEUMOCOCCAL CONJUGATE-20 02/25/2023   Pfizer Covid-19 Vaccine Bivalent Booster 64yrs & up 07/01/2021   Pneumococcal Conjugate-13 04/04/2020   Td 01/15/2023   Tdap 01/15/2023   Unspecified SARS-COV-2 Vaccination 12/04/2022, 04/24/2023   Zoster Recombinant(Shingrix) 01/23/2023, 04/10/2023    TDAP status: Up to date  Flu Vaccine status: Up to date  Pneumococcal vaccine status: Up to date  Covid-19 vaccine status: Completed vaccines  Qualifies for Shingles Vaccine? Yes   Zostavax completed Yes   Shingrix Completed?: Yes  Screening Tests Health Maintenance  Topic Date Due   OPHTHALMOLOGY EXAM  03/27/2023   DEXA SCAN  09/01/2024 (Originally 06/04/2012)   COVID-19 Vaccine (8 - 2024-25 season) 09/14/2024  (Originally 06/19/2023)   FOOT EXAM  02/25/2024   HEMOGLOBIN A1C  03/02/2024   DTaP/Tdap/Td (3 - Td or Tdap) 01/14/2033   Pneumonia Vaccine 42+ Years old  Completed   Hepatitis C Screening  Completed   Zoster Vaccines- Shingrix  Completed   HPV VACCINES  Aged Out   Meningococcal B Vaccine  Aged Out   INFLUENZA VACCINE  Discontinued    Health Maintenance  Health Maintenance Due  Topic Date Due   OPHTHALMOLOGY EXAM  03/27/2023    Colorectal cancer screening: No longer required.   Mammogram status: No longer required due to dementia diagnosis.  Bone Density status: Completed not completed due to dementia. Results reflect: Bone density results: NORMAL. Repeat every not done  years.  Lung Cancer Screening: (Low Dose CT Chest recommended if Age 64-80 years, 20 pack-year currently smoking OR have quit w/in 15years.) does not qualify.   Lung Cancer Screening Referral: No  Additional Screening:  Hepatitis C Screening: does not qualify; Completed   Vision Screening: Recommended annual ophthalmology exams for early detection of glaucoma and other disorders of the eye. Is the patient up to date with their annual eye exam?  No  Who is the provider or what is the name of the office in which the patient attends annual eye exams? Refused due to advanced dementia If pt is not established with a provider, would they like to be referred to a provider to establish care? No .   Dental Screening: Recommended annual dental exams for proper oral hygiene  Diabetic Foot Exam: Diabetic Foot Exam: Completed 12/23/2023  Community Resource Referral / Chronic Care Management: CRR required this visit?  No   CCM required this visit?  No     Plan:     I have personally reviewed and noted the following in the patient's chart:   Medical and social history Use of alcohol, tobacco or illicit drugs  Current medications and supplements including opioid prescriptions. Patient is not currently taking  opioid prescriptions. Functional ability and status Nutritional status Physical activity Advanced directives List of other physicians Hospitalizations, surgeries, and ER visits in previous 12 months Vitals Screenings to include cognitive, depression, and falls Referrals and appointments  In addition, I have reviewed and discussed with patient certain preventive protocols, quality metrics, and  best practice recommendations. A written personalized care plan for preventive services as well as general preventive health recommendations were provided to patient.     Octavia Heir, NP   12/23/2023   After Visit Summary: (MyChart) Due to this being a telephonic visit, the after visit summary with patients personalized plan was offered to patient via MyChart   Nurse Notes: unable to perform cognitive testing due to advanced dementia. UTD on vaccinations. No future mammograms or DEXA due to goals of care/dementia.

## 2024-01-04 ENCOUNTER — Other Ambulatory Visit: Payer: Medicare Other

## 2024-01-04 DIAGNOSIS — E1142 Type 2 diabetes mellitus with diabetic polyneuropathy: Secondary | ICD-10-CM

## 2024-01-05 ENCOUNTER — Encounter: Payer: Self-pay | Admitting: Orthopedic Surgery

## 2024-01-05 LAB — HEMOGLOBIN A1C
Hgb A1c MFr Bld: 8.3 % — ABNORMAL HIGH (ref <5.7)
Mean Plasma Glucose: 192 mg/dL
eAG (mmol/L): 10.6 mmol/L

## 2024-01-21 ENCOUNTER — Other Ambulatory Visit: Payer: Self-pay | Admitting: Orthopedic Surgery

## 2024-01-21 DIAGNOSIS — G4700 Insomnia, unspecified: Secondary | ICD-10-CM

## 2024-01-25 ENCOUNTER — Telehealth

## 2024-01-25 NOTE — Telephone Encounter (Signed)
 Copied from CRM [id number]. Topic: Clinical - Medication Prior Auth >> Jan 25, 2024  1:56 PM Tisa Forester wrote: Reason for CRM: Mylinda Asa calling from [employer] to  [place]my [place] know the hydrOXYzine  (ATARAX ) 10 MG tablet  was approved 01/25/24  until 01/24/2025 And will fax the prior authorization as well  Call back number if have questions [phone] opt [phone].

## 2024-01-26 DIAGNOSIS — K080 Exfoliation of teeth due to systemic causes: Secondary | ICD-10-CM | POA: Diagnosis not present

## 2024-02-07 ENCOUNTER — Other Ambulatory Visit: Payer: Self-pay | Admitting: Orthopedic Surgery

## 2024-02-07 DIAGNOSIS — E1169 Type 2 diabetes mellitus with other specified complication: Secondary | ICD-10-CM

## 2024-02-17 ENCOUNTER — Ambulatory Visit: Admitting: Orthopedic Surgery

## 2024-02-17 ENCOUNTER — Encounter: Payer: Self-pay | Admitting: Orthopedic Surgery

## 2024-02-17 ENCOUNTER — Ambulatory Visit: Payer: Self-pay

## 2024-02-17 VITALS — BP 114/64 | HR 87 | Temp 96.8°F | Resp 16 | Ht 65.0 in | Wt 110.2 lb

## 2024-02-17 DIAGNOSIS — L602 Onychogryphosis: Secondary | ICD-10-CM

## 2024-02-17 DIAGNOSIS — F0390 Unspecified dementia without behavioral disturbance: Secondary | ICD-10-CM | POA: Diagnosis not present

## 2024-02-17 DIAGNOSIS — R2681 Unsteadiness on feet: Secondary | ICD-10-CM | POA: Diagnosis not present

## 2024-02-17 MED ORDER — MEMANTINE HCL 5 MG PO TABS
5.0000 mg | ORAL_TABLET | Freq: Two times a day (BID) | ORAL | 2 refills | Status: DC
Start: 2024-02-17 — End: 2024-03-02

## 2024-02-17 NOTE — Telephone Encounter (Signed)
 Patient is scheduled to see Ulyses Gandy, NP today 02/17/24

## 2024-02-17 NOTE — Progress Notes (Signed)
 Careteam: Patient Care Team: Arnetha Bhat, NP as PCP - General (Adult Health Nurse Practitioner) [facility], [place]  Seen by: Ulyses Gandy, AGNP-C  PLACE OF SERVICE:  [facility]  Advanced Directive information    Allergies  Allergen Reactions   Aricept  [Donepezil  Hcl] Diarrhea    Chief Complaint  Patient presents with   Fatigue    Patient is having weakness and right leg pain that has increased over the past month.      HPI: Patient is a 78 y.o. female seen today for acute visit due to unstable gait.   Discussed the use of AI scribe software for clinical note transcription with the patient, who gave verbal consent to proceed.  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 Husband reports difficulty standing up, particularly after sitting for extended periods. It recently took her three hours to get off the couch. She attempts to push herself up but struggles to put weight on her legs, with the right side being more affected. No falls have been reported.Her mobility issues have also been noted at daycare [employer]). She continues to walk about half a mile daily but at a slower pace than before. She is also dragging her right foot more. She was able to follow simple commands in office today. She was able to stand up from chair and give me a hug. I observed her ambulating down hall with her holding on to husband for balance.   Her sleep has been generally good, with one bad night last week. Hydroxyzine  was administered, which helped her sleep. She is compliant with taking medication.   In the past, she tried Aricept  but experienced significant gastrointestinal upset, leading to discontinuation. Namenda was previously considered but not covered by insurance; however, there is now some coverage available.  Family history is notable for her mother having Alzheimer's, which she cared for over a ten-year period.       Review of Systems:  Review of Systems   Unable to perform ROS: Dementia    Past Medical History:  Diagnosis Date   Dehydration    Dementia (HCC)    Diabetes mellitus without complication (HCC)    Hyperlipidemia    Hypokalemia    Hypomagnesemia 08/05/2020   Pt was started on Aricept  and developed severe diarrhea, dehydration and hypokalemia that put her in NSVT and Torsades.  Med was stopped and electrolytes repleted with no further arrythmias.   Torsades de pointes (HCC)    Ventricular tachycardia, non-sustained (HCC)    No past surgical history on file. Social History:   reports that she has never smoked. She has never used smokeless tobacco. She reports that she does not drink alcohol and does not use drugs.  Family History  Problem Relation Age of Onset   Dementia Mother 33    Medications: Patient's Medications  New Prescriptions   No medications on file  Previous Medications   APOAEQUORIN (PREVAGEN PO)    Take 1 capsule by mouth daily.   HYDROXYZINE  (ATARAX ) 10 MG TABLET    TAKE 1 TABLET BY MOUTH AT BEDTIME AS NEEDED.   METFORMIN  (GLUCOPHAGE ) 1000 MG TABLET    Take 1 tablet (1,000 mg total) by mouth daily with breakfast.   MULTIPLE VITAMIN (MULTIVITAMIN WITH MINERALS) TABS TABLET    Take 1 tablet by mouth daily.   ONDANSETRON  (ZOFRAN ) 4 MG TABLET    Take 1 tablet (4 mg total) by mouth every 8 (eight) hours as needed for nausea  or vomiting.   ROSUVASTATIN  (CRESTOR ) 20 MG TABLET    TAKE 1 TABLET BY MOUTH EVERY DAY  Modified Medications   No medications on file  Discontinued Medications   No medications on file    Physical Exam:  Vitals:   02/17/24 1020  BP: 114/64  Pulse: 87  Resp: 16  Temp: (!) 96.8 F (36 C)  SpO2: 92%  Weight: 110 lb 3.2 oz (50 kg)  Height: 5\' 5"  (1.651 m)   Body mass index is 18.34 kg/m. Wt Readings from Last 3 Encounters:  02/17/24 110 lb 3.2 oz (50 kg)  12/23/23 111 lb 9.6 oz (50.6 kg)  09/02/23 112 lb 6.4 oz (51 kg)    Physical Exam Vitals reviewed.   Constitutional:      General: She is not in acute distress. HENT:     Head: Normocephalic.  Eyes:     General:        Right eye: No discharge.        Left eye: No discharge.  Cardiovascular:     Rate and Rhythm: Normal rate and regular rhythm.     Pulses: Normal pulses.     Heart sounds: Normal heart sounds.  Pulmonary:     Effort: Pulmonary effort is normal.     Breath sounds: Normal breath sounds.  Abdominal:     General: Bowel sounds are normal. There is no distension.     Palpations: Abdomen is soft.     Tenderness: There is no abdominal tenderness.  Musculoskeletal:     Cervical back: Neck supple.     Right lower leg: No edema.     Left lower leg: No edema.  Skin:    General: Skin is warm.     Capillary Refill: Capillary refill takes less than 2 seconds.  Neurological:     General: No focal deficit present.     Mental Status: She is alert. Mental status is at baseline.     Motor: Weakness present.     Gait: Gait abnormal.  Psychiatric:        Mood and Affect: Mood normal.     Comments: Alert to self/familiar face, follows commands, very pleasant     Labs reviewed: Basic Metabolic Panel: Recent Labs    03/29/23 0404 04/08/23 1544 09/02/23 0852  NA 139 139 137  K 3.7 4.4 4.3  CL 107 103 100  CO2 25 30 29   GLUCOSE 162* 131* 170*  BUN 16 19 20   CREATININE 0.80 0.89 0.73  CALCIUM  8.9 9.8 9.5   Liver Function Tests: Recent Labs    03/28/23 1445 03/29/23 0404 09/02/23 0852  AST 23 22 22   ALT 26 20 23   ALKPHOS 59 45  --   BILITOT 0.4 0.6 0.5  PROT 7.1 6.2* 7.1  ALBUMIN 3.8 3.2*  --    No results for input(s): "LIPASE", "AMYLASE" in the last 8760 hours. No results for input(s): "AMMONIA" in the last 8760 hours. CBC: Recent Labs    02/25/23 0858 03/28/23 1445 03/28/23 1451 03/29/23 0404 09/02/23 0852  WBC 7.6 18.6*  --  13.7* 7.9  NEUTROABS 5,533 17.0*  --   --  5,862  HGB 12.8 11.6* 11.6* 10.3* 11.4*  HCT 39.1 35.4* 34.0* 32.0* 34.4*  MCV  90.1 92.9  --  91.4 87.8  PLT 273 232  --  206 304   Lipid Panel: Recent Labs    02/25/23 0858 09/02/23 0852  CHOL 169 173  HDL 89 100  LDLCALC 64 60  TRIG 78 53  CHOLHDL 1.9 1.7   TSH: No results for input(s): "TSH" in the last 8760 hours. A1C: Lab Results  Component Value Date   HGBA1C 8.3 (H) 01/04/2024     Assessment/Plan 1. Unstable gait (Primary) - increased difficulty standing from chair - she was able to stand from chair with minimal assistance today - ? Unable to follow instruction versus physical weakness - no recent falls or injuries - concerned this is associated with worsening dementia - discussed using walker and wheelchair for safety   2. Dementia without behavioral disturbance (HCC) - dependent with some ADLs - attends [employer] - no agitation - wanders often - see above - cont hydroxyzine  prn for behaviors - past reaction to Aricept > profuse GI upset - will try Namenda to see if she is more compliant with commands  - memantine (NAMENDA) 5 MG tablet; Take 1 tablet (5 mg total) by mouth 2 (two) times daily.  Dispense: 60 tablet; Refill: 2 - CBC with Differential/Platelet - Complete Metabolic Panel with eGFR  3. Onychauxis - thickened toenails with yellow/brown discoloration - no skin breakdown - do not recommend podiatry due to dementia  Total time: 31 minutes. Greater than 50% of total time spent doing patient education regarding unstable gait, dementia, falls safety and onychauxis including symptom/medication management.    Next appt: 03/02/2024  Ulyses Gandy, Joyice Nodal  [facility] [phone]

## 2024-02-17 NOTE — Telephone Encounter (Signed)
 FYI Only or Action Required?: Action required by provider  Patient was last seen in primary care on 12/23/2023 by Arnetha Bhat, NP. Called Nurse Triage reporting Leg Pain. Symptoms began about a month ago. Interventions attempted: Nothing. Symptoms are: gradually worsening.  Triage Disposition: See PCP When Office is Open (Within 3 Days)  Patient/caregiver understands and will follow disposition?: Yes

## 2024-02-17 NOTE — Telephone Encounter (Signed)
 Reason for Disposition  [1] MODERATE pain (e.g., interferes with normal activities, limping) AND [2] present > 3 days  Answer Assessment - Initial Assessment Questions 1. ONSET: "When did the pain start?"      Over 30 years but worse last month   2. LOCATION: "Where is the pain located?"      Right leg  3. PAIN: "How bad is the pain?"    (Scale 1-10; or mild, moderate, severe)   -  MILD (1-3): doesn't interfere with normal activities    -  MODERATE (4-7): interferes with normal activities (e.g., work or school) or awakens from sleep, limping    -  SEVERE (8-10): excruciating pain, unable to do any normal activities, unable to walk     Non verbal  4. WORK OR EXERCISE: "Has there been any recent work or exercise that involved this part of the body?"      Na  5. CAUSE: "What do you think is causing the leg pain?"     Na  6. OTHER SYMPTOMS: "Do you have any other symptoms?" (e.g., chest pain, back pain, breathing difficulty, swelling, rash, fever, numbness, weakness)     Denies  Protocols used: Leg Pain-A-AH

## 2024-02-18 ENCOUNTER — Ambulatory Visit: Payer: Self-pay | Admitting: Orthopedic Surgery

## 2024-02-20 ENCOUNTER — Other Ambulatory Visit: Payer: Self-pay | Admitting: Orthopedic Surgery

## 2024-02-20 DIAGNOSIS — E1142 Type 2 diabetes mellitus with diabetic polyneuropathy: Secondary | ICD-10-CM

## 2024-03-02 ENCOUNTER — Ambulatory Visit (INDEPENDENT_AMBULATORY_CARE_PROVIDER_SITE_OTHER): Payer: Medicare Other | Admitting: Orthopedic Surgery

## 2024-03-02 ENCOUNTER — Encounter: Payer: Self-pay | Admitting: Orthopedic Surgery

## 2024-03-02 VITALS — BP 128/88 | HR 73 | Temp 97.3°F | Resp 12 | Ht 65.0 in | Wt 108.2 lb

## 2024-03-02 DIAGNOSIS — E119 Type 2 diabetes mellitus without complications: Secondary | ICD-10-CM | POA: Diagnosis not present

## 2024-03-02 DIAGNOSIS — R636 Underweight: Secondary | ICD-10-CM

## 2024-03-02 DIAGNOSIS — F0284 Dementia in other diseases classified elsewhere, unspecified severity, with anxiety: Secondary | ICD-10-CM | POA: Diagnosis not present

## 2024-03-02 DIAGNOSIS — F0390 Unspecified dementia without behavioral disturbance: Secondary | ICD-10-CM

## 2024-03-02 MED ORDER — MEMANTINE HCL 5 MG PO TABS
5.0000 mg | ORAL_TABLET | Freq: Every evening | ORAL | 2 refills | Status: DC
Start: 2024-03-02 — End: 2024-04-13

## 2024-03-02 NOTE — Patient Instructions (Signed)
 Reduce Namenda  to daily in the evening> if GI upset continues stop medication  Fasting lab work next visit> only water or black 12 hours prior to labs being drawn

## 2024-03-02 NOTE — Progress Notes (Signed)
 Careteam: Patient Care Team: Arnetha Bhat, NP as PCP - General (Adult Health Nurse Practitioner) [facility], [place]  Seen by: Ulyses Gandy, AGNP-C  PLACE OF SERVICE:  [facility]  Advanced Directive information Does Patient Have a Medical Advance Directive?: Yes, Type of Advance Directive: Out of facility DNR (pink MOST or yellow form);Healthcare Power of [place];Living will, Does patient want to make changes to medical advance directive?: No - Patient declined  Allergies  Allergen Reactions   Aricept  [Donepezil  Hcl] Diarrhea    Chief Complaint  Patient presents with   Medical Management of Chronic Issues    6 month follow up  Discuss eye exam  Medication memantine  has given her diarrhea.      HPI: Patient is a 78 y.o. female seen today for medical management of chronic conditions.   Discussed the use of AI scribe software for clinical note transcription with the patient, who gave verbal consent to proceed.  History of Present Illness    Husband present during encounter.   She has been experiencing diarrhea for the past two weeks, which began after initiating Namenda . The current dosage is 10 mg per day, divided into 5 mg in the morning and 5 mg in the evening. The onset of diarrhea coincides with the start of this medication.  Regarding her dementia, she has been having issues with following commands and cognitive delays. She is sleeping more, averaging about 10 to 12 hours per night after starting Namenda . Still has unstable gait. Mainly nonverbal. Recent fall at [employer], no injury.   She has a history of diabetes and is currently taking Metformin  for management.   Remains on stain for cholesterol management.   Her weight has decreased by four pounds. Her appetite varies, with some nights being more hungry than others.      Review of Systems:  Review of Systems  Unable to perform ROS: Dementia    Past Medical History:  Diagnosis Date    Dehydration    Dementia (HCC)    Diabetes mellitus without complication (HCC)    Hyperlipidemia    Hypokalemia    Hypomagnesemia 08/05/2020   Pt was started on Aricept  and developed severe diarrhea, dehydration and hypokalemia that put her in NSVT and Torsades.  Med was stopped and electrolytes repleted with no further arrythmias.   Torsades de pointes (HCC)    Ventricular tachycardia, non-sustained (HCC)    History reviewed. No pertinent surgical history. Social History:   reports that she has never smoked. She has never used smokeless tobacco. She reports that she does not drink alcohol and does not use drugs.  Family History  Problem Relation Age of Onset   Dementia Mother 47    Medications: Patient's Medications  New Prescriptions   No medications on file  Previous Medications   APOAEQUORIN (PREVAGEN PO)    Take 1 capsule by mouth daily.   HYDROXYZINE  (ATARAX ) 10 MG TABLET    TAKE 1 TABLET BY MOUTH AT BEDTIME AS NEEDED.   METFORMIN  (GLUCOPHAGE ) 1000 MG TABLET    Take 1 tablet (1,000 mg total) by mouth daily with breakfast.   MULTIPLE VITAMIN (MULTIVITAMIN WITH MINERALS) TABS TABLET    Take 1 tablet by mouth daily.   ONDANSETRON  (ZOFRAN ) 4 MG TABLET    Take 1 tablet (4 mg total) by mouth every 8 (eight) hours as needed for nausea or vomiting.   ROSUVASTATIN  (CRESTOR ) 20 MG TABLET    TAKE 1 TABLET BY MOUTH  EVERY DAY  Modified Medications   Modified Medication Previous Medication   MEMANTINE  (NAMENDA ) 5 MG TABLET memantine  (NAMENDA ) 5 MG tablet      Take 1 tablet (5 mg total) by mouth every evening.    Take 1 tablet (5 mg total) by mouth 2 (two) times daily.  Discontinued Medications   No medications on file    Physical Exam:  Vitals:   03/02/24 0837  BP: 128/88  Pulse: 73  Resp: 12  Temp: (!) 97.3 F (36.3 C)  SpO2: (!) 84%  Weight: 108 lb 3.2 oz (49.1 kg)  Height: 5' 5 (1.651 m)   Body mass index is 18.01 kg/m. Wt Readings from Last 3 Encounters:  03/02/24  108 lb 3.2 oz (49.1 kg)  02/17/24 110 lb 3.2 oz (50 kg)  12/23/23 111 lb 9.6 oz (50.6 kg)    Physical Exam Vitals reviewed.  Constitutional:      General: She is not in acute distress. HENT:     Head: Normocephalic.   Eyes:     General:        Right eye: No discharge.        Left eye: No discharge.    Cardiovascular:     Rate and Rhythm: Normal rate and regular rhythm.     Pulses: Normal pulses.     Heart sounds: Normal heart sounds.  Pulmonary:     Effort: Pulmonary effort is normal.     Breath sounds: Normal breath sounds.  Abdominal:     General: There is no distension.     Palpations: Abdomen is soft.     Tenderness: There is no abdominal tenderness.   Musculoskeletal:     Cervical back: Neck supple.     Right lower leg: No edema.     Left lower leg: No edema.   Skin:    General: Skin is warm.     Capillary Refill: Capillary refill takes less than 2 seconds.   Neurological:     General: No focal deficit present.     Mental Status: She is alert. Mental status is at baseline.     Gait: Gait abnormal.     Comments: Shuffling gait  Psychiatric:        Mood and Affect: Mood normal.     Labs reviewed: Basic Metabolic Panel: Recent Labs    04/08/23 1544 09/02/23 0852 02/17/24 1109  NA 139 137 138  K 4.4 4.3 4.2  CL 103 100 103  CO2 30 29 28   GLUCOSE 131* 170* 212*  BUN 19 20 20   CREATININE 0.89 0.73 0.64  CALCIUM  9.8 9.5 9.6   Liver Function Tests: Recent Labs    03/28/23 1445 03/29/23 0404 09/02/23 0852 02/17/24 1109  AST 23 22 22 15   ALT 26 20 23 17   ALKPHOS 59 45  --   --   BILITOT 0.4 0.6 0.5 0.3  PROT 7.1 6.2* 7.1 7.1  ALBUMIN 3.8 3.2*  --   --    No results for input(s): LIPASE, AMYLASE in the last 8760 hours. No results for input(s): AMMONIA in the last 8760 hours. CBC: Recent Labs    03/28/23 1445 03/28/23 1451 03/29/23 0404 09/02/23 0852 02/17/24 1109  WBC 18.6*  --  13.7* 7.9 7.0  NEUTROABS 17.0*  --   --  5,862  4,886  HGB 11.6*   < > 10.3* 11.4* 11.3*  HCT 35.4*   < > 32.0* 34.4* 34.5*  MCV 92.9  --  91.4  87.8 89.8  PLT 232  --  206 304 287   < > = values in this interval not displayed.   Lipid Panel: Recent Labs    09/02/23 0852  CHOL 173  HDL 100  LDLCALC 60  TRIG 53  CHOLHDL 1.7   TSH: No results for input(s): TSH in the last 8760 hours. A1C: Lab Results  Component Value Date   HGBA1C 8.3 (H) 01/04/2024     Assessment/Plan 1. Dementia without behavioral disturbance (HCC) - dependent with some ADLs - shuffling gait> recent fall no injury - some GI upset with Namenda  > will reduce to once every evening - improved sleep with Namenda   - weight stable - memantine  (NAMENDA ) 5 MG tablet; Take 1 tablet (5 mg total) by mouth every evening.  Dispense: 30 tablet; Refill: 2  2. Controlled type 2 diabetes mellitus without complication, without long-term current use of insulin  (HCC) (Primary) - A1c was 8.3 - cont metformin    3. Underweight - BMI 18.01 - protein 7.1, albumin 4.3 02/17/2024  Total time: 32. Greater than 50% of total time spent doing patient education regarding health maintenance, dementia, weight loss and T2dm including symptom/medication management.     Next appt: 08/24/2024  Ulyses Gandy, Joyice Nodal  [facility] & Adult Medicine [phone]

## 2024-04-13 ENCOUNTER — Other Ambulatory Visit: Payer: Self-pay | Admitting: Orthopedic Surgery

## 2024-04-13 DIAGNOSIS — F0390 Unspecified dementia without behavioral disturbance: Secondary | ICD-10-CM

## 2024-04-27 ENCOUNTER — Other Ambulatory Visit: Payer: Self-pay | Admitting: Orthopedic Surgery

## 2024-04-27 DIAGNOSIS — G4700 Insomnia, unspecified: Secondary | ICD-10-CM

## 2024-05-16 DIAGNOSIS — K080 Exfoliation of teeth due to systemic causes: Secondary | ICD-10-CM | POA: Diagnosis not present

## 2024-05-30 ENCOUNTER — Other Ambulatory Visit: Payer: Self-pay | Admitting: Orthopedic Surgery

## 2024-05-30 DIAGNOSIS — E1142 Type 2 diabetes mellitus with diabetic polyneuropathy: Secondary | ICD-10-CM

## 2024-06-14 ENCOUNTER — Telehealth: Payer: Self-pay | Admitting: Orthopedic Surgery

## 2024-06-14 NOTE — Telephone Encounter (Signed)
 Patient husband dropped off a ([employer]) form to be completed by PCP and returned. Given to Jasmine in CI

## 2024-06-14 NOTE — Telephone Encounter (Signed)
 Provider information filled in and placed into review and sign folder. Message routed to PCP Gil Greig BRAVO, NP as RICK. Please return to Clinical Intake when completed so husband can be contacted for pick up and we can make a copy.

## 2024-06-15 NOTE — Telephone Encounter (Addendum)
 Patients spouse is aware for ready for pick-up. Copy sent to scanning

## 2024-06-15 NOTE — Telephone Encounter (Signed)
 Completed.

## 2024-06-27 ENCOUNTER — Other Ambulatory Visit: Payer: Self-pay | Admitting: Orthopedic Surgery

## 2024-06-27 DIAGNOSIS — F0390 Unspecified dementia without behavioral disturbance: Secondary | ICD-10-CM

## 2024-07-03 ENCOUNTER — Telehealth: Payer: Self-pay | Admitting: *Deleted

## 2024-07-03 NOTE — Telephone Encounter (Signed)
 Saddie with [employer] [phone] is going to fax over the form that Amy Gil filled out for patient to update the Diet portion. Stated that in the past patient has been on a Diabetic diet.   Saddie is going to write up what specifics are needed and fax back to us  for update.

## 2024-07-06 DIAGNOSIS — D2262 Melanocytic nevi of left upper limb, including shoulder: Secondary | ICD-10-CM | POA: Diagnosis not present

## 2024-07-06 DIAGNOSIS — D225 Melanocytic nevi of trunk: Secondary | ICD-10-CM | POA: Diagnosis not present

## 2024-07-06 DIAGNOSIS — D2261 Melanocytic nevi of right upper limb, including shoulder: Secondary | ICD-10-CM | POA: Diagnosis not present

## 2024-07-06 DIAGNOSIS — D2272 Melanocytic nevi of left lower limb, including hip: Secondary | ICD-10-CM | POA: Diagnosis not present

## 2024-07-20 LAB — CBC WITH DIFFERENTIAL/PLATELET
Absolute Lymphocytes: 1575 {cells}/uL (ref 850–3900)
Absolute Monocytes: 448 {cells}/uL (ref 200–950)
Basophils Absolute: 63 {cells}/uL (ref 0–200)
Basophils Relative: 0.9 %
Eosinophils Absolute: 28 {cells}/uL (ref 15–500)
Eosinophils Relative: 0.4 %
HCT: 34.5 % — ABNORMAL LOW (ref 35.0–45.0)
Hemoglobin: 11.3 g/dL — ABNORMAL LOW (ref 11.7–15.5)
MCH: 29.4 pg (ref 27.0–33.0)
MCHC: 32.8 g/dL (ref 32.0–36.0)
MCV: 89.8 fL (ref 80.0–100.0)
MPV: 9.5 fL (ref 7.5–12.5)
Monocytes Relative: 6.4 %
Neutro Abs: 4886 {cells}/uL (ref 1500–7800)
Neutrophils Relative %: 69.8 %
Platelets: 287 Thousand/uL (ref 140–400)
RBC: 3.84 Million/uL (ref 3.80–5.10)
RDW: 12 % (ref 11.0–15.0)
Total Lymphocyte: 22.5 %
WBC: 7 Thousand/uL (ref 3.8–10.8)

## 2024-07-20 LAB — COMPLETE METABOLIC PANEL WITHOUT GFR
AG Ratio: 1.5 (calc) (ref 1.0–2.5)
ALT: 17 U/L (ref 6–29)
AST: 15 U/L (ref 10–35)
Albumin: 4.3 g/dL (ref 3.6–5.1)
Alkaline phosphatase (APISO): 57 U/L (ref 37–153)
BUN: 20 mg/dL (ref 7–25)
CO2: 28 mmol/L (ref 20–32)
Calcium: 9.6 mg/dL (ref 8.6–10.4)
Chloride: 103 mmol/L (ref 98–110)
Creat: 0.64 mg/dL (ref 0.60–1.00)
Globulin: 2.8 g/dL (ref 1.9–3.7)
Glucose, Bld: 212 mg/dL — ABNORMAL HIGH (ref 65–139)
Potassium: 4.2 mmol/L (ref 3.5–5.3)
Sodium: 138 mmol/L (ref 135–146)
Total Bilirubin: 0.3 mg/dL (ref 0.2–1.2)
Total Protein: 7.1 g/dL (ref 6.1–8.1)

## 2024-07-24 ENCOUNTER — Other Ambulatory Visit: Payer: Self-pay | Admitting: Orthopedic Surgery

## 2024-07-24 DIAGNOSIS — G4700 Insomnia, unspecified: Secondary | ICD-10-CM

## 2024-07-28 ENCOUNTER — Other Ambulatory Visit: Payer: Self-pay | Admitting: Orthopedic Surgery

## 2024-07-28 DIAGNOSIS — E1169 Type 2 diabetes mellitus with other specified complication: Secondary | ICD-10-CM

## 2024-08-24 ENCOUNTER — Ambulatory Visit: Payer: Self-pay | Admitting: Orthopedic Surgery

## 2024-08-24 ENCOUNTER — Encounter: Payer: Self-pay | Admitting: Orthopedic Surgery

## 2024-08-24 VITALS — BP 122/80 | HR 60 | Temp 97.1°F | Ht 65.0 in | Wt 106.6 lb

## 2024-08-24 DIAGNOSIS — G4700 Insomnia, unspecified: Secondary | ICD-10-CM | POA: Diagnosis not present

## 2024-08-24 DIAGNOSIS — E7800 Pure hypercholesterolemia, unspecified: Secondary | ICD-10-CM

## 2024-08-24 DIAGNOSIS — Z794 Long term (current) use of insulin: Secondary | ICD-10-CM | POA: Diagnosis not present

## 2024-08-24 DIAGNOSIS — E1142 Type 2 diabetes mellitus with diabetic polyneuropathy: Secondary | ICD-10-CM | POA: Diagnosis not present

## 2024-08-24 DIAGNOSIS — R634 Abnormal weight loss: Secondary | ICD-10-CM

## 2024-08-24 DIAGNOSIS — F0390 Unspecified dementia without behavioral disturbance: Secondary | ICD-10-CM

## 2024-08-24 MED ORDER — HYDROXYZINE HCL 10 MG PO TABS: 10.0000 mg | ORAL_TABLET | Freq: Two times a day (BID) | ORAL | Status: DC | PRN

## 2024-08-24 NOTE — Patient Instructions (Signed)
 You may give 2 hydroxyzine  daily as needed

## 2024-08-24 NOTE — Progress Notes (Signed)
 Careteam: Patient Care Team: Gil Greig BRAVO, NP as PCP - General (Adult Health Nurse Practitioner) [facility], [place]  Seen by: Greig Gil, AGNP-C  PLACE OF SERVICE:  [facility]  Advanced Directive information    Allergies[1]  Chief Complaint  Patient presents with   Follow-up    6 month follow up     HPI: Patient is a 78 y.o. female seen today for medical management of chronic conditions.   Discussed the use of AI scribe software for clinical note transcription with the patient, who gave verbal consent to proceed.  History of Present Illness   Stephanie Acevedo is a 78 year old female with dementia and diabetes who presents for a routine follow-up visit. She is accompanied by her husband.  She has ongoing issues related to her dementia, including difficulty getting up from sitting position and behaviors such as turning off lights while her husband is cooking and standing in a trance-like state. She wakes up two to three times during the night but returns to bed without difficulty. Her husband administers hydroxyzine  every night to aid her sleep, but two tablets make her too foggy. She continues to attend [employer], which is reportedly going well.She remains on Namenda .   Her weight has decreased by four pounds since the last visit. Her eating habits vary, with some nights eating everything and other nights just nibbling.   She has a history of diabetes, with her last A1c checked in April at 8.3. She continues to take metformin  every morning.   No recent falls or injuries.      Review of Systems:  Review of Systems  Unable to perform ROS: Dementia    Past Medical History:  Diagnosis Date   Dehydration    Dementia (HCC)    Diabetes mellitus without complication (HCC)    Hyperlipidemia    Hypokalemia    Hypomagnesemia 08/05/2020   Pt was started on Aricept  and developed severe diarrhea, dehydration and hypokalemia that put her in NSVT and Torsades.   Med was stopped and electrolytes repleted with no further arrythmias.   Torsades de pointes (HCC)    Ventricular tachycardia, non-sustained (HCC)    History reviewed. No pertinent surgical history. Social History:   reports that she has never smoked. She has never used smokeless tobacco. She reports that she does not drink alcohol and does not use drugs.  Family History  Problem Relation Age of Onset   Dementia Mother 31    Medications: Patient's Medications  New Prescriptions   No medications on file  Previous Medications   APOAEQUORIN (PREVAGEN PO)    Take 1 capsule by mouth daily.   MEMANTINE  (NAMENDA ) 5 MG TABLET    TAKE 1 TABLET BY MOUTH TWICE A DAY   METFORMIN  (GLUCOPHAGE ) 1000 MG TABLET    TAKE 1 TABLET BY MOUTH EVERY DAY WITH BREAKFAST   MULTIPLE VITAMIN (MULTIVITAMIN WITH MINERALS) TABS TABLET    Take 1 tablet by mouth daily.   ROSUVASTATIN  (CRESTOR ) 20 MG TABLET    TAKE 1 TABLET BY MOUTH EVERY DAY  Modified Medications   Modified Medication Previous Medication   HYDROXYZINE  (ATARAX ) 10 MG TABLET hydrOXYzine  (ATARAX ) 10 MG tablet      Take 1 tablet (10 mg total) by mouth 2 (two) times daily as needed for anxiety.    TAKE 1 TABLET BY MOUTH EVERY DAY AT BEDTIME AS NEEDED  Discontinued Medications   No medications on file    Physical  Exam:  Vitals:   08/24/24 0831  BP: 122/80  Pulse: 60  Temp: (!) 97.1 F (36.2 C)  SpO2: 96%  Weight: 106 lb 9.6 oz (48.4 kg)  Height: 5' 5 (1.651 m)   Body mass index is 17.74 kg/m. Wt Readings from Last 3 Encounters:  08/24/24 106 lb 9.6 oz (48.4 kg)  03/02/24 108 lb 3.2 oz (49.1 kg)  02/17/24 110 lb 3.2 oz (50 kg)    Physical Exam Vitals reviewed.  Constitutional:      General: She is not in acute distress. HENT:     Head: Normocephalic and atraumatic.     Right Ear: Tympanic membrane normal. There is no impacted cerumen.     Left Ear: Tympanic membrane normal. There is no impacted cerumen.     Nose: Nose normal.      Mouth/Throat:     Mouth: Mucous membranes are moist.  Eyes:     General:        Right eye: No discharge.        Left eye: No discharge.  Cardiovascular:     Rate and Rhythm: Normal rate and regular rhythm.     Pulses: Normal pulses.     Heart sounds: Normal heart sounds.  Pulmonary:     Effort: Pulmonary effort is normal.     Breath sounds: Normal breath sounds.  Abdominal:     General: Bowel sounds are normal. There is no distension.     Palpations: Abdomen is soft.     Tenderness: There is no abdominal tenderness.  Musculoskeletal:     Cervical back: Neck supple.     Right lower leg: No edema.     Left lower leg: No edema.  Skin:    General: Skin is warm.     Capillary Refill: Capillary refill takes less than 2 seconds.  Neurological:     General: No focal deficit present.     Mental Status: She is alert. Mental status is at baseline.     Motor: Weakness present.     Gait: Gait abnormal.     Comments: Shuffling gait  Psychiatric:        Mood and Affect: Mood normal.     Labs reviewed: Basic Metabolic Panel: Recent Labs    09/02/23 0852 02/17/24 1109  NA 137 138  K 4.3 4.2  CL 100 103  CO2 29 28  GLUCOSE 170* 212*  BUN 20 20  CREATININE 0.73 0.64  CALCIUM  9.5 9.6   Liver Function Tests: Recent Labs    09/02/23 0852 02/17/24 1109  AST 22 15  ALT 23 17  BILITOT 0.5 0.3  PROT 7.1 7.1   No results for input(s): LIPASE, AMYLASE in the last 8760 hours. No results for input(s): AMMONIA in the last 8760 hours. CBC: Recent Labs    09/02/23 0852 02/17/24 1109  WBC 7.9 7.0  NEUTROABS 5,862 4,886  HGB 11.4* 11.3*  HCT 34.4* 34.5*  MCV 87.8 89.8  PLT 304 287   Lipid Panel: Recent Labs    09/02/23 0852  CHOL 173  HDL 100  LDLCALC 60  TRIG 53  CHOLHDL 1.7   TSH: No results for input(s): TSH in the last 8760 hours. A1C: Lab Results  Component Value Date   HGBA1C 8.3 (H) 01/04/2024     Assessment/Plan 1. Dementia without behavioral  disturbance (HCC) (Primary) - no agitation - weight loss 4 lbs - aphasia, shuffling gait - dependent with ADLs except feeding - doing well  at [employer]  - cont Namenda  and hydroxyzine  - CBC with Differential/Platelet - Complete Metabolic Panel with eGFR  2. Controlled type 2 diabetes mellitus with diabetic polyneuropathy, without long-term current use of insulin  (HCC) - A1c 8.3 - no future eye exams  - cont low carb and sugar diet  - cont metformin   - Hemoglobin A1c  3. Pure hypercholesterolemia - total 173, LDL 60 08/2023 - cont rosuvastatin  - Lipid Panel  4. Insomnia, unspecified type - waking up 2-3 times per night - easily redirected to go back to sleep  - cont hydroxyzine  - hydrOXYzine  (ATARAX ) 10 MG tablet; Take 1 tablet (10 mg total) by mouth 2 (two) times daily as needed for anxiety.  5. Weight loss - 4 lbs weight loss  - appetite varies per husband - will check TSH - TSH  Total time: 32 minutes. Greater than 50% of total time spent doing patient education regarding health maintenance, dementia, T2DM, HTN, HLD and insomnia including symptom/medication management,    Next appt: Visit date not found  Stephanie Acevedo Gil BODILY  [facility] & Adult Medicine [phone]      [1]  Allergies Allergen Reactions   Aricept  [Donepezil  Hcl] Diarrhea

## 2024-08-25 ENCOUNTER — Ambulatory Visit: Payer: Self-pay | Admitting: Orthopedic Surgery

## 2024-08-25 LAB — COMPLETE METABOLIC PANEL WITHOUT GFR
AG Ratio: 1.5 (calc) (ref 1.0–2.5)
ALT: 14 U/L (ref 6–29)
AST: 15 U/L (ref 10–35)
Albumin: 4.4 g/dL (ref 3.6–5.1)
Alkaline phosphatase (APISO): 61 U/L (ref 37–153)
BUN: 21 mg/dL (ref 7–25)
CO2: 28 mmol/L (ref 20–32)
Calcium: 9.6 mg/dL (ref 8.6–10.4)
Chloride: 105 mmol/L (ref 98–110)
Creat: 0.75 mg/dL (ref 0.60–1.00)
Globulin: 2.9 g/dL (ref 1.9–3.7)
Glucose, Bld: 146 mg/dL — ABNORMAL HIGH (ref 65–99)
Potassium: 4.5 mmol/L (ref 3.5–5.3)
Sodium: 142 mmol/L (ref 135–146)
Total Bilirubin: 0.3 mg/dL (ref 0.2–1.2)
Total Protein: 7.3 g/dL (ref 6.1–8.1)

## 2024-08-25 LAB — CBC WITH DIFFERENTIAL/PLATELET
Absolute Lymphocytes: 1872 {cells}/uL (ref 850–3900)
Absolute Monocytes: 408 {cells}/uL (ref 200–950)
Basophils Absolute: 64 {cells}/uL (ref 0–200)
Basophils Relative: 0.8 %
Eosinophils Absolute: 32 {cells}/uL (ref 15–500)
Eosinophils Relative: 0.4 %
HCT: 33.8 % — ABNORMAL LOW (ref 35.9–46.0)
Hemoglobin: 11.4 g/dL — ABNORMAL LOW (ref 11.7–15.5)
MCH: 30.2 pg (ref 27.0–33.0)
MCHC: 33.7 g/dL (ref 31.6–35.4)
MCV: 89.4 fL (ref 81.4–101.7)
MPV: 9.6 fL (ref 7.5–12.5)
Monocytes Relative: 5.1 %
Neutro Abs: 5624 {cells}/uL (ref 1500–7800)
Neutrophils Relative %: 70.3 %
Platelets: 288 Thousand/uL (ref 140–400)
RBC: 3.78 Million/uL — ABNORMAL LOW (ref 3.80–5.10)
RDW: 12.8 % (ref 11.0–15.0)
Total Lymphocyte: 23.4 %
WBC: 8 Thousand/uL (ref 3.8–10.8)

## 2024-08-25 LAB — LIPID PANEL
Cholesterol: 144 mg/dL (ref <200)
HDL: 94 mg/dL (ref >=50)
LDL Cholesterol (Calc): 37 mg/dL
Non-HDL Cholesterol (Calc): 50 mg/dL (ref <130)
Total CHOL/HDL Ratio: 1.5 (calc) (ref <5.0)
Triglycerides: 51 mg/dL (ref <150)

## 2024-08-25 LAB — HEMOGLOBIN A1C
Hgb A1c MFr Bld: 7.2 % — ABNORMAL HIGH (ref <5.7)
Mean Plasma Glucose: 160 mg/dL
eAG (mmol/L): 8.9 mmol/L

## 2024-08-25 LAB — TSH: TSH: 1.81 m[IU]/L (ref 0.40–4.50)

## 2024-10-18 ENCOUNTER — Other Ambulatory Visit: Payer: Self-pay | Admitting: Orthopedic Surgery

## 2024-10-18 DIAGNOSIS — G4700 Insomnia, unspecified: Secondary | ICD-10-CM

## 2024-10-18 NOTE — Telephone Encounter (Signed)
 Medium risk warning populated when attempting to refill medications, will send to provider for pending  review and approval

## 2025-02-22 ENCOUNTER — Ambulatory Visit: Admitting: Orthopedic Surgery
